# Patient Record
Sex: Female | Born: 1945 | Race: Black or African American | Hispanic: No | Smoking: Never smoker
Health system: Southern US, Community
[De-identification: ages and names within clinical notes are randomized; demographics above are authoritative.]

## PROBLEM LIST (undated history)

## (undated) DIAGNOSIS — E049 Nontoxic goiter, unspecified: Secondary | ICD-10-CM

## (undated) DIAGNOSIS — IMO0001 Reserved for inherently not codable concepts without codable children: Secondary | ICD-10-CM

## (undated) DIAGNOSIS — N189 Chronic kidney disease, unspecified: Secondary | ICD-10-CM

## (undated) DIAGNOSIS — K219 Gastro-esophageal reflux disease without esophagitis: Secondary | ICD-10-CM

## (undated) DIAGNOSIS — M4800 Spinal stenosis, site unspecified: Secondary | ICD-10-CM

## (undated) DIAGNOSIS — H269 Unspecified cataract: Secondary | ICD-10-CM

## (undated) DIAGNOSIS — T7840XA Allergy, unspecified, initial encounter: Secondary | ICD-10-CM

## (undated) DIAGNOSIS — D649 Anemia, unspecified: Secondary | ICD-10-CM

## (undated) DIAGNOSIS — Z780 Asymptomatic menopausal state: Secondary | ICD-10-CM

## (undated) DIAGNOSIS — E785 Hyperlipidemia, unspecified: Secondary | ICD-10-CM

## (undated) DIAGNOSIS — M1990 Unspecified osteoarthritis, unspecified site: Secondary | ICD-10-CM

## (undated) DIAGNOSIS — I10 Essential (primary) hypertension: Secondary | ICD-10-CM

## (undated) HISTORY — DX: Nontoxic goiter, unspecified: E04.9

## (undated) HISTORY — DX: Essential (primary) hypertension: I10

## (undated) HISTORY — DX: Anemia, unspecified: D64.9

## (undated) HISTORY — DX: Unspecified osteoarthritis, unspecified site: M19.90

## (undated) HISTORY — DX: Unspecified cataract: H26.9

## (undated) HISTORY — DX: Chronic kidney disease, unspecified: N18.9

## (undated) HISTORY — DX: Gastro-esophageal reflux disease without esophagitis: K21.9

## (undated) HISTORY — DX: Hyperlipidemia, unspecified: E78.5

## (undated) HISTORY — DX: Reserved for inherently not codable concepts without codable children: IMO0001

## (undated) HISTORY — DX: Asymptomatic menopausal state: Z78.0

## (undated) HISTORY — DX: Spinal stenosis, site unspecified: M48.00

## (undated) HISTORY — DX: Allergy, unspecified, initial encounter: T78.40XA

---

## 1998-01-03 ENCOUNTER — Encounter: Payer: Self-pay | Admitting: Internal Medicine

## 1998-01-03 ENCOUNTER — Ambulatory Visit (HOSPITAL_COMMUNITY): Admission: RE | Admit: 1998-01-03 | Discharge: 1998-01-03 | Payer: Self-pay | Admitting: Internal Medicine

## 1998-06-22 ENCOUNTER — Other Ambulatory Visit: Admission: RE | Admit: 1998-06-22 | Discharge: 1998-06-22 | Payer: Self-pay | Admitting: *Deleted

## 1998-08-24 ENCOUNTER — Encounter (INDEPENDENT_AMBULATORY_CARE_PROVIDER_SITE_OTHER): Payer: Self-pay | Admitting: Specialist

## 1998-08-24 ENCOUNTER — Other Ambulatory Visit: Admission: RE | Admit: 1998-08-24 | Discharge: 1998-08-24 | Payer: Self-pay | Admitting: *Deleted

## 1999-07-05 ENCOUNTER — Emergency Department (HOSPITAL_COMMUNITY): Admission: EM | Admit: 1999-07-05 | Discharge: 1999-07-05 | Payer: Self-pay | Admitting: *Deleted

## 1999-07-10 ENCOUNTER — Other Ambulatory Visit: Admission: RE | Admit: 1999-07-10 | Discharge: 1999-07-10 | Payer: Self-pay | Admitting: *Deleted

## 1999-07-23 ENCOUNTER — Encounter: Payer: Self-pay | Admitting: Occupational Medicine

## 1999-07-23 ENCOUNTER — Ambulatory Visit (HOSPITAL_COMMUNITY): Admission: RE | Admit: 1999-07-23 | Discharge: 1999-07-23 | Payer: Self-pay | Admitting: Occupational Medicine

## 1999-07-26 ENCOUNTER — Encounter (HOSPITAL_COMMUNITY): Admission: RE | Admit: 1999-07-26 | Discharge: 1999-10-24 | Payer: Self-pay | Admitting: Occupational Medicine

## 1999-08-09 ENCOUNTER — Encounter: Admission: RE | Admit: 1999-08-09 | Discharge: 1999-09-04 | Payer: Self-pay | Admitting: Occupational Medicine

## 1999-08-22 ENCOUNTER — Encounter: Payer: Self-pay | Admitting: Neurosurgery

## 1999-08-22 ENCOUNTER — Ambulatory Visit (HOSPITAL_COMMUNITY): Admission: RE | Admit: 1999-08-22 | Discharge: 1999-08-22 | Payer: Self-pay | Admitting: Neurosurgery

## 2000-01-15 LAB — HM COLONOSCOPY

## 2001-02-25 ENCOUNTER — Other Ambulatory Visit: Admission: RE | Admit: 2001-02-25 | Discharge: 2001-02-25 | Payer: Self-pay | Admitting: *Deleted

## 2001-09-23 ENCOUNTER — Encounter: Admission: RE | Admit: 2001-09-23 | Discharge: 2001-09-23 | Payer: Self-pay | Admitting: Nephrology

## 2001-09-23 ENCOUNTER — Encounter: Payer: Self-pay | Admitting: Nephrology

## 2001-09-25 ENCOUNTER — Encounter: Payer: Self-pay | Admitting: Nephrology

## 2001-09-25 ENCOUNTER — Encounter: Admission: RE | Admit: 2001-09-25 | Discharge: 2001-09-25 | Payer: Self-pay | Admitting: Nephrology

## 2003-03-22 ENCOUNTER — Other Ambulatory Visit: Admission: RE | Admit: 2003-03-22 | Discharge: 2003-03-22 | Payer: Self-pay | Admitting: Internal Medicine

## 2003-05-13 ENCOUNTER — Ambulatory Visit (HOSPITAL_COMMUNITY): Admission: RE | Admit: 2003-05-13 | Discharge: 2003-05-13 | Payer: Self-pay | Admitting: Gastroenterology

## 2003-08-30 ENCOUNTER — Encounter: Admission: RE | Admit: 2003-08-30 | Discharge: 2003-08-30 | Payer: Self-pay | Admitting: Internal Medicine

## 2005-01-14 LAB — HM MAMMOGRAPHY

## 2005-01-14 LAB — HM PAP SMEAR

## 2005-01-14 LAB — HM COLONOSCOPY: HM Colonoscopy: NORMAL

## 2009-01-14 LAB — HM PAP SMEAR

## 2009-03-20 ENCOUNTER — Encounter: Admission: RE | Admit: 2009-03-20 | Discharge: 2009-03-20 | Payer: Self-pay | Admitting: Internal Medicine

## 2009-05-16 ENCOUNTER — Encounter: Admission: RE | Admit: 2009-05-16 | Discharge: 2009-05-16 | Payer: Self-pay | Admitting: Internal Medicine

## 2010-01-14 LAB — HM MAMMOGRAPHY: HM Mammogram: NORMAL

## 2010-03-05 ENCOUNTER — Ambulatory Visit
Admission: RE | Admit: 2010-03-05 | Discharge: 2010-03-05 | Disposition: A | Discharge status: Home / Self Care | Payer: 59 | Source: Ambulatory Visit | Attending: Family Medicine | Admitting: Family Medicine

## 2010-03-05 ENCOUNTER — Other Ambulatory Visit: Payer: Self-pay | Admitting: Family Medicine

## 2010-03-05 DIAGNOSIS — R29898 Other symptoms and signs involving the musculoskeletal system: Secondary | ICD-10-CM

## 2010-03-05 DIAGNOSIS — R200 Anesthesia of skin: Secondary | ICD-10-CM

## 2010-03-14 ENCOUNTER — Ambulatory Visit (HOSPITAL_COMMUNITY)
Admission: RE | Admit: 2010-03-14 | Discharge: 2010-03-14 | Disposition: A | Discharge status: Home / Self Care | Payer: 59 | Source: Ambulatory Visit | Attending: Neurosurgery | Admitting: Neurosurgery

## 2010-03-14 ENCOUNTER — Other Ambulatory Visit (HOSPITAL_COMMUNITY): Payer: Self-pay | Admitting: Neurosurgery

## 2010-03-14 ENCOUNTER — Encounter (HOSPITAL_COMMUNITY)
Admission: RE | Admit: 2010-03-14 | Discharge: 2010-03-14 | Disposition: A | Discharge status: Home / Self Care | Payer: 59 | Source: Ambulatory Visit | Attending: Neurosurgery | Admitting: Neurosurgery

## 2010-03-14 DIAGNOSIS — Z01818 Encounter for other preprocedural examination: Secondary | ICD-10-CM | POA: Insufficient documentation

## 2010-03-14 DIAGNOSIS — Z01810 Encounter for preprocedural cardiovascular examination: Secondary | ICD-10-CM | POA: Insufficient documentation

## 2010-03-14 DIAGNOSIS — M5090 Cervical disc disorder, unspecified, unspecified cervical region: Secondary | ICD-10-CM | POA: Insufficient documentation

## 2010-03-14 DIAGNOSIS — Z01812 Encounter for preprocedural laboratory examination: Secondary | ICD-10-CM | POA: Insufficient documentation

## 2010-03-14 DIAGNOSIS — Z01811 Encounter for preprocedural respiratory examination: Secondary | ICD-10-CM

## 2010-03-14 LAB — BASIC METABOLIC PANEL
BUN: 17 mg/dL (ref 6–23)
CO2: 31 mEq/L (ref 19–32)
Calcium: 10.2 mg/dL (ref 8.4–10.5)
Chloride: 101 mEq/L (ref 96–112)
Creatinine, Ser: 0.95 mg/dL (ref 0.4–1.2)
GFR calc Af Amer: >60 mL/min (ref >60)
GFR calc non Af Amer: 59 mL/min — ABNORMAL LOW (ref >60)
Glucose, Bld: 98 mg/dL (ref 70–99)
Potassium: 4.2 mEq/L (ref 3.5–5.1)
Sodium: 138 mEq/L (ref 135–145)

## 2010-03-14 LAB — URINALYSIS, ROUTINE W REFLEX MICROSCOPIC
Bilirubin Urine: NEGATIVE
Hgb urine dipstick: NEGATIVE
Ketones, ur: NEGATIVE mg/dL
Nitrite: NEGATIVE
Protein, ur: NEGATIVE mg/dL
Specific Gravity, Urine: 1.009 (ref 1.005–1.030)
Urine Glucose, Fasting: NEGATIVE mg/dL
Urobilinogen, UA: 0.2 mg/dL (ref 0.0–1.0)
pH: 7 (ref 5.0–8.0)

## 2010-03-14 LAB — SURGICAL PCR SCREEN
MRSA, PCR: NEGATIVE
Staphylococcus aureus: NEGATIVE

## 2010-03-14 LAB — CBC
HCT: 39.1 % (ref 36.0–46.0)
Hemoglobin: 12.8 g/dL (ref 12.0–15.0)
MCH: 29.2 pg (ref 26.0–34.0)
MCHC: 32.7 g/dL (ref 30.0–36.0)
MCV: 89.1 fL (ref 78.0–100.0)
Platelets: 283 10*3/uL (ref 150–400)
RBC: 4.39 MIL/uL (ref 3.87–5.11)
RDW: 13.5 % (ref 11.5–15.5)
WBC: 16.5 10*3/uL — ABNORMAL HIGH (ref 4.0–10.5)

## 2010-03-14 LAB — URINE MICROSCOPIC-ADD ON

## 2010-03-14 LAB — APTT: aPTT: 27 seconds (ref 24–37)

## 2010-03-14 LAB — PROTIME-INR
INR: 0.93 (ref 0.00–1.49)
Prothrombin Time: 12.7 seconds (ref 11.6–15.2)

## 2010-03-15 ENCOUNTER — Observation Stay (HOSPITAL_COMMUNITY)
Admission: RE | Admit: 2010-03-15 | Discharge: 2010-03-16 | Disposition: A | Discharge status: Home / Self Care | Payer: 59 | Source: Ambulatory Visit | Attending: Neurosurgery | Admitting: Neurosurgery

## 2010-03-15 ENCOUNTER — Observation Stay (HOSPITAL_COMMUNITY): Payer: 59

## 2010-03-15 DIAGNOSIS — K219 Gastro-esophageal reflux disease without esophagitis: Secondary | ICD-10-CM | POA: Insufficient documentation

## 2010-03-15 DIAGNOSIS — M47812 Spondylosis without myelopathy or radiculopathy, cervical region: Secondary | ICD-10-CM | POA: Insufficient documentation

## 2010-03-15 DIAGNOSIS — I10 Essential (primary) hypertension: Secondary | ICD-10-CM | POA: Insufficient documentation

## 2010-03-15 DIAGNOSIS — Z7982 Long term (current) use of aspirin: Secondary | ICD-10-CM | POA: Insufficient documentation

## 2010-03-15 DIAGNOSIS — M5000 Cervical disc disorder with myelopathy, unspecified cervical region: Principal | ICD-10-CM | POA: Insufficient documentation

## 2010-03-23 NOTE — Op Note (Signed)
NAMEVERNISHA, [name] NO.:  [id number]  MEDICAL RECORD NO.:  [id number]           PATIENT TYPE:  I  LOCATION:  [id number]                         FACILITY:  [facility]  PHYSICIAN:  Otilio Connors, M.D.  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  PROCEDURE:  Anterior cervical decompression, diskectomy, fusion at C6-7 with allograft bone and Trestle anterior cervical plate.  SURGEON:  Otilio Connors, MD  ASSISTANT:  Hosie Spangle, MD  General endotracheal tube anesthesia.  ESTIMATED BLOOD LOSS:  Minimal.  BLOOD GIVEN:  None.  DRAINS:  None.  COMPLICATIONS:  None.  REASON FOR PROCEDURE:  The patient is a 65 year old woman who has had neck and left arm pain, numbness, and weakness, found to have decreased left triceps finger extension and even some decreased sensation in entire left hand worse in the 7-8 distribution.  An MRI was done showing multilevel spondylotic changes in the cervical spine but has worse spondylotic change and disk herniation central to the left at the 6-7 level causing some left-sided canal stenosis and foraminal stenosis. The patient was brought in for decompression and fusion.  PROCEDURE IN DETAIL:  The patient was brought to the operating room. General anesthesia was induced.  The patient was placed in 10-pound halter traction, prepared and draped in sterile fashion.  Site of incision injected with 10 mL of 1% lidocaine with epinephrine.  Incision was then made from the midline to the anterior border of the sternocleidomastoid muscle on the left side, neck incision taken down to the platysma and hemostasis obtained with Bovie cauterization.   The platysma was incised with the Bovie and blunt dissection taken through the anterior cervical fascia of the anterior cervical spine.  Needles were placed at 2 interspaces and x-rays obtained showing the top needle was at the 6-7 space.  The 6-7 disk space was incised with a 15-blade and partial diskectomy done with pituitary rongeurs as the needles were removed.  Longus coli muscles were reflected laterally on each side using the Bovie, and self-retaining retractors were placed after removing the extensive osteophytes with osteophyte tool.  Diskectomy was continued with the curettes and pituitary rongeurs, and then distraction pins were placed in C6-C7 interspace distracted, and self-retaining retractors were also placed.  Microscope was brought in for microdissection and high-speed drill was used to drill through the spondylotic disk space removing cartilaginous endplate.  We then used 1 and 2-mm Kerrison punches to remove posterior disk osteophyte and ligament, decompressing the central canal and performing bilateral foraminotomies.  There was significant calcified disk herniation and some soft disk herniation to the left side.  This was removed and decompressing the central canal and the bilateral nerve roots.  When we were finished, we had good decompression of the roots exiting on each side.  Measured height of disk space to be 5 mm, and 5-mm allograft bone was tapped  into place.  Distraction pins were removed.  Weight was removed from the traction.  Got hemostasis with Gelfoam and thrombin. We irrigated with antibiotic solution.  Bone plug was in good firm position and Trestle anterior cervical plate was placed over the anterior cervical spine, and 2 screws placed in C6, 2 into C7.  These were final tightened.  The lateral x-rays were obtained showing good position of plate, screws, bone plug at the 6-7 level.  Retractors were removed.  Hemostasis obtained with bipolar  cauterization.  We irrigated with antibiotic solution, and we had very good hemostasis.  The platysma was closed with 3-0 Vicryl interrupted sutures.  Subcutaneous tissue closed with the same.  Skin closed with benzoin and Steri-Strips. Dressing was placed.  The patient was placed into a soft cervical collar, awakened from anesthesia, and transferred to recovery room in stable condition.          ______________________________ Otilio Connors, M.D.     JRH/MEDQ  D:  03/15/2010  T:  03/16/2010  Job:  [id number]  Electronically Signed by Hazle Coca M.D. on 03/23/2010 05:06:44 PM

## 2010-04-23 ENCOUNTER — Other Ambulatory Visit: Payer: Self-pay | Admitting: Internal Medicine

## 2010-04-23 DIAGNOSIS — Z1231 Encounter for screening mammogram for malignant neoplasm of breast: Secondary | ICD-10-CM

## 2010-04-26 ENCOUNTER — Ambulatory Visit
Admission: RE | Admit: 2010-04-26 | Discharge: 2010-04-26 | Disposition: A | Discharge status: Home / Self Care | Payer: 59 | Source: Ambulatory Visit | Attending: Internal Medicine | Admitting: Internal Medicine

## 2010-04-26 DIAGNOSIS — Z1231 Encounter for screening mammogram for malignant neoplasm of breast: Secondary | ICD-10-CM

## 2010-06-01 NOTE — Op Note (Signed)
NAME:  Jo [name], Jo [name]                          ACCOUNT NO.:  [id number]   MEDICAL RECORD NO.:  [id number]                   PATIENT TYPE:  AMB   LOCATION:  ENDO                                 [facility]ACILITY[facility]  [facility]   PHYSICIAN:  Earle Gell, M.D.                DATE OF BIRTH:  1945/03/23   DATE OF PROCEDURE:  05/13/2003  DATE OF DISCHARGE:                                 OPERATIVE REPORT   PROCEDURE:  Screening colonoscopy.   INDICATIONS:  Jo [name] is a 65 year old female, born Dec 06, 1945.  Jo [name] is scheduled to undergo her first screening colonoscopy with  polypectomy to prevent colon cancer.   ENDOSCOPIST:  Earle Gell, M.D.   PREMEDICATION:  Versed 5 mg, Demerol 50 mg.   DESCRIPTION OF PROCEDURE:  After obtaining informed consent, Jo [name] was  placed in the left lateral decubitus position.  I administered intravenous  Demerol and intravenous Versed to achieve conscious sedation for the  procedure.  The patient's blood pressure, oxygen saturation and cardiac  rhythm were monitored throughout the procedure and documented in the medical  record.   Anal inspection and digital rectal exam were normal.  The Olympus adjustable  pediatric colonoscope was introduced into the rectum and advanced to the  cecum.  Colonic preparation for the exam today was excellent.   Rectum:  Normal.  Sigmoid colon and descending colon:  Normal.  Splenic flexure:  Normal.  Transverse colon:  Normal.  Hepatic flexure:  Normal.  Ascending colon:  Normal.  Cecum and ileocecal valve:  Normal.   ASSESSMENT:  Normal screening proctocolonoscopy to the cecum.   RECOMMENDATIONS:  Repeat colonoscopy in 10 years.                                               Earle Gell, M.D.   MJ/MEDQ  D:  05/13/2003  T:  05/13/2003  Job:  [id number]   cc:   Leilani Merl, M.D.  [address]  [place] [address]  Fax: [phone]

## 2010-06-15 HISTORY — PX: CERVICAL SPINE SURGERY: SHX589

## 2010-07-25 LAB — LIPID PANEL
Cholesterol: 198 mg/dL (ref 0–200)
HDL: 66 mg/dL (ref 35–70)
LDL Cholesterol: 122 mg/dL
LDl/HDL Ratio: 3
Triglycerides: 52 mg/dL (ref 40–160)

## 2010-07-25 LAB — BASIC METABOLIC PANEL
Glucose: 88 mg/dL
Potassium: 4.3 mmol/L (ref 3.4–5.3)

## 2011-01-21 ENCOUNTER — Ambulatory Visit: Payer: Self-pay | Admitting: Internal Medicine

## 2011-02-23 ENCOUNTER — Encounter: Payer: Self-pay | Admitting: *Deleted

## 2011-02-23 DIAGNOSIS — E785 Hyperlipidemia, unspecified: Secondary | ICD-10-CM

## 2011-02-23 DIAGNOSIS — IMO0001 Reserved for inherently not codable concepts without codable children: Secondary | ICD-10-CM | POA: Insufficient documentation

## 2011-02-23 DIAGNOSIS — D649 Anemia, unspecified: Secondary | ICD-10-CM | POA: Insufficient documentation

## 2011-02-23 DIAGNOSIS — I10 Essential (primary) hypertension: Secondary | ICD-10-CM | POA: Insufficient documentation

## 2011-02-25 ENCOUNTER — Ambulatory Visit (INDEPENDENT_AMBULATORY_CARE_PROVIDER_SITE_OTHER): Payer: 59 | Admitting: Internal Medicine

## 2011-02-25 ENCOUNTER — Encounter: Payer: Self-pay | Admitting: Internal Medicine

## 2011-02-25 DIAGNOSIS — Z79899 Other long term (current) drug therapy: Secondary | ICD-10-CM

## 2011-02-25 DIAGNOSIS — E785 Hyperlipidemia, unspecified: Secondary | ICD-10-CM

## 2011-02-25 DIAGNOSIS — I10 Essential (primary) hypertension: Secondary | ICD-10-CM

## 2011-02-25 MED ORDER — ATORVASTATIN CALCIUM 20 MG PO TABS: 20.0000 mg | ORAL_TABLET | Freq: Every day | ORAL | Status: DC

## 2011-02-25 MED ORDER — LISINOPRIL 10 MG PO TABS: 10.0000 mg | ORAL_TABLET | Freq: Every day | ORAL | Status: DC

## 2011-02-25 NOTE — Progress Notes (Signed)
  Subjective:    [name], female    DOB: 12-04-1945, 66 y.o.   MRN: [id number]  HPI HTN treated and controlled. Hyperlipidemia treated and controlled. No side affects from meds. No other complaints.   Review of Systems UNchanged.    Objective:   Physical Exam  Constitutional: She is oriented to person, place, and time. She appears well-developed and well-nourished.  Eyes: EOM are normal.  Neck: Neck supple.  Cardiovascular: Normal rate, regular rhythm and normal heart sounds.   Pulmonary/Chest: Effort normal and breath sounds normal.  Musculoskeletal: Normal range of motion.  Neurological: She is alert and oriented to person, place, and time.          Assessment & Plan:  Refill meds one year. CPE 6-7 months.

## 2011-02-26 LAB — COMPREHENSIVE METABOLIC PANEL
ALT: 10 U/L (ref 0–35)
AST: 20 U/L (ref 0–37)
Albumin: 4.1 g/dL (ref 3.5–5.2)
Alkaline Phosphatase: 71 U/L (ref 39–117)
BUN: 15 mg/dL (ref 6–23)
CO2: 26 mEq/L (ref 19–32)
Calcium: 9.5 mg/dL (ref 8.4–10.5)
Chloride: 102 mEq/L (ref 96–112)
Creat: 0.92 mg/dL (ref 0.50–1.10)
Glucose, Bld: 86 mg/dL (ref 70–99)
Potassium: 3.9 mEq/L (ref 3.5–5.3)
Sodium: 139 mEq/L (ref 135–145)
Total Bilirubin: 0.4 mg/dL (ref 0.3–1.2)
Total Protein: 7.2 g/dL (ref 6.0–8.3)

## 2011-05-06 ENCOUNTER — Other Ambulatory Visit: Payer: Self-pay | Admitting: Internal Medicine

## 2011-05-06 DIAGNOSIS — Z1231 Encounter for screening mammogram for malignant neoplasm of breast: Secondary | ICD-10-CM

## 2011-05-07 ENCOUNTER — Ambulatory Visit
Admission: RE | Admit: 2011-05-07 | Discharge: 2011-05-07 | Disposition: A | Discharge status: Home / Self Care | Payer: 59 | Source: Ambulatory Visit | Attending: Internal Medicine | Admitting: Internal Medicine

## 2011-05-07 DIAGNOSIS — Z1231 Encounter for screening mammogram for malignant neoplasm of breast: Secondary | ICD-10-CM

## 2011-05-08 NOTE — Progress Notes (Signed)
Spoke with patient and let her know test was normal.

## 2011-05-08 NOTE — Progress Notes (Signed)
LMOM to call back

## 2011-09-02 ENCOUNTER — Encounter: Payer: Self-pay | Admitting: Internal Medicine

## 2011-09-02 ENCOUNTER — Ambulatory Visit (INDEPENDENT_AMBULATORY_CARE_PROVIDER_SITE_OTHER): Payer: 59 | Admitting: Internal Medicine

## 2011-09-02 VITALS — BP 152/88 | HR 63 | Temp 98.1°F | Resp 16 | Ht 65.0 in | Wt 179.0 lb

## 2011-09-02 DIAGNOSIS — Z79899 Other long term (current) drug therapy: Secondary | ICD-10-CM

## 2011-09-02 DIAGNOSIS — I10 Essential (primary) hypertension: Secondary | ICD-10-CM

## 2011-09-02 DIAGNOSIS — E785 Hyperlipidemia, unspecified: Secondary | ICD-10-CM

## 2011-09-02 DIAGNOSIS — D509 Iron deficiency anemia, unspecified: Secondary | ICD-10-CM

## 2011-09-02 DIAGNOSIS — Z0000 Encounter for general adult medical examination without abnormal findings: Secondary | ICD-10-CM

## 2011-09-02 LAB — POCT URINALYSIS DIPSTICK
Bilirubin, UA: NEGATIVE
Glucose, UA: NEGATIVE
Ketones, UA: NEGATIVE
Leukocytes, UA: NEGATIVE
Nitrite, UA: NEGATIVE
Protein, UA: 100
Spec Grav, UA: 1.02
Urobilinogen, UA: 0.2
pH, UA: 7

## 2011-09-02 LAB — CBC
HCT: 35.3 % — ABNORMAL LOW (ref 36.0–46.0)
Hemoglobin: 12 g/dL (ref 12.0–15.0)
MCH: 28.7 pg (ref 26.0–34.0)
MCHC: 34 g/dL (ref 30.0–36.0)
MCV: 84.4 fL (ref 78.0–100.0)
Platelets: 262 10*3/uL (ref 150–400)
RBC: 4.18 MIL/uL (ref 3.87–5.11)
RDW: 13.8 % (ref 11.5–15.5)
WBC: 7.9 10*3/uL (ref 4.0–10.5)

## 2011-09-02 LAB — COMPREHENSIVE METABOLIC PANEL
ALT: 14 U/L (ref 0–35)
AST: 20 U/L (ref 0–37)
Albumin: 4 g/dL (ref 3.5–5.2)
Alkaline Phosphatase: 69 U/L (ref 39–117)
BUN: 15 mg/dL (ref 6–23)
CO2: 29 mEq/L (ref 19–32)
Calcium: 9.7 mg/dL (ref 8.4–10.5)
Chloride: 103 mEq/L (ref 96–112)
Creat: 0.86 mg/dL (ref 0.50–1.10)
Glucose, Bld: 85 mg/dL (ref 70–99)
Potassium: 4.5 mEq/L (ref 3.5–5.3)
Sodium: 138 mEq/L (ref 135–145)
Total Bilirubin: 0.5 mg/dL (ref 0.3–1.2)
Total Protein: 7.3 g/dL (ref 6.0–8.3)

## 2011-09-02 LAB — POCT UA - MICROSCOPIC ONLY
Casts, Ur, LPF, POC: NEGATIVE
Crystals, Ur, HPF, POC: NEGATIVE
Mucus, UA: NEGATIVE
Yeast, UA: NEGATIVE

## 2011-09-02 LAB — LIPID PANEL
Cholesterol: 197 mg/dL (ref 0–200)
HDL: 66 mg/dL (ref >39)
LDL Cholesterol: 121 mg/dL — ABNORMAL HIGH (ref 0–99)
Total CHOL/HDL Ratio: 3 Ratio
Triglycerides: 51 mg/dL (ref <150)
VLDL: 10 mg/dL (ref 0–40)

## 2011-09-02 LAB — TSH: TSH: 1.12 u[IU]/mL (ref 0.350–4.500)

## 2011-09-02 LAB — IFOBT (OCCULT BLOOD): IFOBT: NEGATIVE

## 2011-09-02 NOTE — Progress Notes (Signed)
  Subjective:    [name], female    DOB: 17-Jan-1945, 66 y.o.   MRN: [id number]  HPI Doing well BP and lipids are stable. Tolerates meds See scanned hx  Review of Systems see scanned ros   Objective:   Physical Exam  Constitutional: She appears well-developed and well-nourished.  HENT:  Right Ear: External ear normal.  Left Ear: External ear normal.  Nose: Nose normal.  Mouth/Throat: Oropharynx is clear and moist.  Eyes: EOM are normal. Pupils are equal, round, and reactive to light.  Neck: Normal range of motion. Neck supple. No thyromegaly present.  Cardiovascular: Normal rate, regular rhythm and normal heart sounds.   Pulmonary/Chest: Effort normal and breath sounds normal.  Abdominal: Soft. Bowel sounds are normal.  Genitourinary: No breast swelling, tenderness or discharge. Pelvic exam was performed with patient supine.       Normal rectal exam hemosure done  Lymphadenopathy:    She has no cervical adenopathy.   ekg labs       Assessment & Plan:  RF meds 66yr

## 2012-03-09 ENCOUNTER — Ambulatory Visit: Payer: 59 | Admitting: Internal Medicine

## 2012-03-13 ENCOUNTER — Other Ambulatory Visit: Payer: Self-pay | Admitting: Internal Medicine

## 2012-03-14 ENCOUNTER — Other Ambulatory Visit: Payer: Self-pay | Admitting: Internal Medicine

## 2012-03-14 NOTE — Telephone Encounter (Signed)
PT SAYS PHARMACY HAS SENT REQUESTS TO [place] FOR A REFILL ON LISINOPRIL AND THEY HAVE NOT HEARD FROM [place].  SHE IS NOW COMPLETELY OUT.  SHE MISSED HER APPOINTMENT AND HASN'T RESCHEDULED YET THAT SHE HAD WITH DR. Elder Cyphers.  PLEASE CALL [phone]

## 2012-03-14 NOTE — Telephone Encounter (Signed)
Needs office visit.

## 2012-03-16 ENCOUNTER — Telehealth: Payer: Self-pay | Admitting: *Deleted

## 2012-03-16 NOTE — Telephone Encounter (Signed)
Rise Mu, PA-C at 03/14/2012 5:03 PM   Status: Signed            Needs office visit        Tye Savoy at 03/14/2012 11:41 AM    Status: Signed             PT SAYS PHARMACY HAS SENT REQUESTS TO [place] FOR A REFILL ON LISINOPRIL AND THEY HAVE NOT HEARD FROM [place]. SHE IS NOW COMPLETELY OUT. SHE MISSED HER APPOINTMENT AND HASN'T RESCHEDULED YET THAT SHE HAD WITH DR. Elder Cyphers. PLEASE CALL [phone]

## 2012-03-17 NOTE — Telephone Encounter (Signed)
Called patient to advise she needs an office visit. mailbox not set up yet.

## 2012-04-16 ENCOUNTER — Other Ambulatory Visit: Payer: Self-pay | Admitting: Physician Assistant

## 2012-04-20 ENCOUNTER — Encounter: Payer: Self-pay | Admitting: Internal Medicine

## 2012-04-20 ENCOUNTER — Ambulatory Visit (INDEPENDENT_AMBULATORY_CARE_PROVIDER_SITE_OTHER): Payer: 59 | Admitting: Internal Medicine

## 2012-04-20 VITALS — BP 138/80 | HR 68 | Temp 98.2°F | Resp 16 | Ht 65.0 in | Wt 180.0 lb

## 2012-04-20 DIAGNOSIS — Z79899 Other long term (current) drug therapy: Secondary | ICD-10-CM

## 2012-04-20 DIAGNOSIS — E782 Mixed hyperlipidemia: Secondary | ICD-10-CM

## 2012-04-20 DIAGNOSIS — I10 Essential (primary) hypertension: Secondary | ICD-10-CM

## 2012-04-20 LAB — COMPREHENSIVE METABOLIC PANEL
ALT: 13 U/L (ref 0–35)
AST: 20 U/L (ref 0–37)
Albumin: 4 g/dL (ref 3.5–5.2)
Alkaline Phosphatase: 68 U/L (ref 39–117)
BUN: 14 mg/dL (ref 6–23)
CO2: 29 mEq/L (ref 19–32)
Calcium: 9.5 mg/dL (ref 8.4–10.5)
Chloride: 105 mEq/L (ref 96–112)
Creat: 0.89 mg/dL (ref 0.50–1.10)
Glucose, Bld: 88 mg/dL (ref 70–99)
Potassium: 4.7 mEq/L (ref 3.5–5.3)
Sodium: 140 mEq/L (ref 135–145)
Total Bilirubin: 0.4 mg/dL (ref 0.3–1.2)
Total Protein: 6.7 g/dL (ref 6.0–8.3)

## 2012-04-20 MED ORDER — LISINOPRIL 10 MG PO TABS: 10.0000 mg | ORAL_TABLET | Freq: Every day | ORAL | Status: DC

## 2012-04-20 MED ORDER — ATORVASTATIN CALCIUM 20 MG PO TABS: 20.0000 mg | ORAL_TABLET | Freq: Every day | ORAL | Status: DC

## 2012-04-20 NOTE — Progress Notes (Signed)
  Subjective:    [name], female    DOB: [birth year], 67 y.o.   MRN: [id number]  HPI HTN, lipid disorder, med review all is stable and well. Exercising, eating healthy and taking meds.   Review of Systems same    Objective:   Physical Exam  Constitutional: She is oriented to person, place, and time. She appears well-developed and well-nourished.  Eyes: EOM are normal.  Cardiovascular: Normal rate, regular rhythm and normal heart sounds.   Pulmonary/Chest: Effort normal and breath sounds normal.  Neurological: She is alert and oriented to person, place, and time. She exhibits normal muscle tone. Coordination normal.  Skin: No rash noted.  Psychiatric: She has a normal mood and affect. Her behavior is normal. Judgment and thought content normal.    bmet      Assessment & Plan:  RF meds 1 yr

## 2012-04-20 NOTE — Patient Instructions (Signed)
Hypertension As your heart beats, it forces blood through your arteries. This force is your blood pressure. If the pressure is too high, it is called hypertension (HTN) or high blood pressure. HTN is dangerous because you may have it and not know it. High blood pressure may mean that your heart has to work harder to pump blood. Your arteries may be narrow or stiff. The extra work puts you at risk for heart disease, stroke, and other problems.  Blood pressure consists of two numbers, a higher number over a lower, 110/72, for example. It is stated as "110 over 72." The ideal is below 120 for the top number (systolic) and under 80 for the bottom (diastolic). Write down your blood pressure today. You should pay close attention to your blood pressure if you have certain conditions such as:  Heart failure.  Prior heart attack.  Diabetes  Chronic kidney disease.  Prior stroke.  Multiple risk factors for heart disease. To see if you have HTN, your blood pressure should be measured while you are seated with your arm held at the level of the heart. It should be measured at least twice. A one-time elevated blood pressure reading (especially in the Emergency Department) does not mean that you need treatment. There may be conditions in which the blood pressure is different between your right and left arms. It is important to see your caregiver soon for a recheck. Most people have essential hypertension which means that there is not a specific cause. This type of high blood pressure may be lowered by changing lifestyle factors such as:  Stress.  Smoking.  Lack of exercise.  Excessive weight.  Drug/tobacco/alcohol use.  Eating less salt. Most people do not have symptoms from high blood pressure until it has caused damage to the body. Effective treatment can often prevent, delay or reduce that damage. TREATMENT  When a cause has been identified, treatment for high blood pressure is directed at the  cause. There are a large number of medications to treat HTN. These fall into several categories, and your caregiver will help you select the medicines that are best for you. Medications may have side effects. You should review side effects with your caregiver. If your blood pressure stays high after you have made lifestyle changes or started on medicines,   Your medication(s) may need to be changed.  Other problems may need to be addressed.  Be certain you understand your prescriptions, and know how and when to take your medicine.  Be sure to follow up with your caregiver within the time frame advised (usually within two weeks) to have your blood pressure rechecked and to review your medications.  If you are taking more than one medicine to lower your blood pressure, make sure you know how and at what times they should be taken. Taking two medicines at the same time can result in blood pressure that is too low. SEEK IMMEDIATE MEDICAL CARE IF:  You develop a severe headache, blurred or changing vision, or confusion.  You have unusual weakness or numbness, or a faint feeling.  You have severe chest or abdominal pain, vomiting, or breathing problems. MAKE SURE YOU:   Understand these instructions.  Will watch your condition.  Will get help right away if you are not doing well or get worse. Document Released: 12/31/2004 Document Revised: 03/25/2011 Document Reviewed: 08/21/2007 [facility] Patient Information 2013 [place]. DASH Diet The DASH diet stands for "Dietary Approaches to Stop Hypertension." It is a healthy  eating plan that has been shown to reduce high blood pressure (hypertension) in as little as 14 days, while also possibly providing other significant health benefits. These other health benefits include reducing the risk of breast cancer after menopause and reducing the risk of type 2 diabetes, heart disease, colon cancer, and stroke. Health benefits also include weight loss  and slowing kidney failure in patients with chronic kidney disease.  DIET GUIDELINES  Limit salt (sodium). Your diet should contain less than 1500 mg of sodium daily.  Limit refined or processed carbohydrates. Your diet should include mostly whole grains. Desserts and added sugars should be used sparingly.  Include small amounts of heart-healthy fats. These types of fats include nuts, oils, and tub margarine. Limit saturated and trans fats. These fats have been shown to be harmful in the body. CHOOSING FOODS  The following food groups are based on a 2000 calorie diet. See your Registered Dietitian for individual calorie needs. Grains and Grain Products (6 to 8 servings daily)  Eat More Often: Whole-wheat bread, brown rice, whole-grain or wheat pasta, quinoa, popcorn without added fat or salt (air popped).  Eat Less Often: White bread, white pasta, white rice, cornbread. Vegetables (4 to 5 servings daily)  Eat More Often: Fresh, frozen, and canned vegetables. Vegetables may be raw, steamed, roasted, or grilled with a minimal amount of fat.  Eat Less Often/Avoid: Creamed or fried vegetables. Vegetables in a cheese sauce. Fruit (4 to 5 servings daily)  Eat More Often: All fresh, canned (in natural juice), or frozen fruits. Dried fruits without added sugar. One hundred percent fruit juice ( cup [237 mL] daily).  Eat Less Often: Dried fruits with added sugar. Canned fruit in light or heavy syrup. [employer], Fish, and Poultry (2 servings or less daily. One serving is 3 to 4 oz [85-114 g]).  Eat More Often: Ninety percent or leaner ground beef, tenderloin, sirloin. Round cuts of beef, chicken breast, [place] breast. All fish. Grill, bake, or broil your meat. Nothing should be fried.  Eat Less Often/Avoid: Fatty cuts of meat, [place], or chicken leg, thigh, or wing. Fried cuts of meat or fish. Dairy (2 to 3 servings)  Eat More Often: Low-fat or fat-free milk, low-fat plain or light yogurt,  reduced-fat or part-skim cheese.  Eat Less Often/Avoid: Milk (whole, 2%).Whole milk yogurt. Full-fat cheeses. Nuts, Seeds, and Legumes (4 to 5 servings per week)  Eat More Often: All without added salt.  Eat Less Often/Avoid: Salted nuts and seeds, canned beans with added salt. Fats and Sweets (limited)  Eat More Often: Vegetable oils, tub margarines without trans fats, sugar-free gelatin. Mayonnaise and salad dressings.  Eat Less Often/Avoid: Coconut oils, palm oils, butter, stick margarine, cream, half and half, cookies, candy, pie. FOR MORE INFORMATION The Dash Diet Eating Plan: www.dashdiet.org Document Released: 12/20/2010 Document Revised: 03/25/2011 Document Reviewed: 12/20/2010 [facility] Patient Information 2013 [place].

## 2012-04-21 ENCOUNTER — Encounter: Payer: Self-pay | Admitting: Family Medicine

## 2012-08-04 ENCOUNTER — Other Ambulatory Visit: Payer: Self-pay

## 2012-08-04 DIAGNOSIS — Z1231 Encounter for screening mammogram for malignant neoplasm of breast: Secondary | ICD-10-CM

## 2012-08-19 ENCOUNTER — Ambulatory Visit
Admission: RE | Admit: 2012-08-19 | Discharge: 2012-08-19 | Disposition: A | Discharge status: Home / Self Care | Payer: 59 | Source: Ambulatory Visit

## 2012-08-19 DIAGNOSIS — Z1231 Encounter for screening mammogram for malignant neoplasm of breast: Secondary | ICD-10-CM

## 2012-09-29 ENCOUNTER — Ambulatory Visit: Payer: 59

## 2012-09-29 ENCOUNTER — Ambulatory Visit (INDEPENDENT_AMBULATORY_CARE_PROVIDER_SITE_OTHER): Payer: 59 | Admitting: Family Medicine

## 2012-09-29 VITALS — BP 126/82 | HR 92 | Temp 98.4°F | Resp 17 | Ht 65.5 in | Wt 183.0 lb

## 2012-09-29 DIAGNOSIS — M25562 Pain in left knee: Secondary | ICD-10-CM

## 2012-09-29 DIAGNOSIS — M25569 Pain in unspecified knee: Secondary | ICD-10-CM

## 2012-09-29 NOTE — Progress Notes (Signed)
  Subjective:    [name], female    DOB: Jan 07, 1946, 67 y.o.   MRN: [id number]  HPI Jo [name] is a 67 y.o. female  L knee popping  - noted after work getting out of vehicle about 4 days ago - felt sharp pain in knee form low back. No known injury, no fall, noticed clicking/popping in knee the next day. No giving way or locking.  No prior knee problems. No swelling. Pain has resolved in back, leg and knee - just popping.   Tx: none.   Review of Systems  Constitutional: Negative for fever and chills.  Musculoskeletal: Positive for arthralgias (initiallly - now improved. ). Negative for joint swelling and gait problem.  Skin: Negative for color change, rash and wound.       Objective:   Physical Exam  Vitals reviewed. Constitutional: She is oriented to person, place, and time. She appears well-developed and well-nourished. No distress.  Pulmonary/Chest: Effort normal.  Musculoskeletal: She exhibits no edema.       Right knee: She exhibits normal range of motion. No tenderness found.       Left knee: She exhibits abnormal meniscus (clunk palpable medial L knee with flex/IR. ). She exhibits normal range of motion, no swelling, no effusion, no ecchymosis, no deformity, no LCL laxity, normal patellar mobility, no bony tenderness and no MCL laxity. No tenderness found.  nvi distally.   Neurological: She is alert and oriented to person, place, and time.  Skin: Skin is warm and dry.  Psychiatric: She has a normal mood and affect. Her behavior is normal.        [facility] reading (PRIMARY) by  Dr. Carlota Raspberry: L knee: NAD.    Assessment & Plan:  Jo [name] is a 67 y.o. female Knee pain, acute, left - Plan: DG Knee Complete 4 Views Left  L knee pain/popping - now just popping - possible small degenerative meniscal tear, but no pain or instability at present or apparent effusion.  Sx care with tylenol or low dose ibuprofen if needed. Recheck in next 2 weeks if not improving  - sooner if worse. rtc precautions.   Patient Instructions  Your xrays look ok. You can take over the counter tylenol if needed, or low dose ibuprofen if needed. Recheck with me in next 2 weeks if not much improved.  Return to the clinic or go to the nearest emergency room if any of your symptoms worsen or new symptoms occur.

## 2012-09-29 NOTE — Patient Instructions (Signed)
Your xrays look ok. You can take over the counter tylenol if needed, or low dose ibuprofen if needed. Recheck with me in next 2 weeks if not much improved.  Return to the clinic or go to the nearest emergency room if any of your symptoms worsen or new symptoms occur.

## 2012-11-02 ENCOUNTER — Encounter: Payer: Self-pay | Admitting: Internal Medicine

## 2012-11-02 ENCOUNTER — Ambulatory Visit (INDEPENDENT_AMBULATORY_CARE_PROVIDER_SITE_OTHER): Payer: 59 | Admitting: Internal Medicine

## 2012-11-02 VITALS — BP 122/76 | HR 65 | Temp 98.3°F | Resp 16 | Ht 65.5 in | Wt 178.0 lb

## 2012-11-02 DIAGNOSIS — Z79899 Other long term (current) drug therapy: Secondary | ICD-10-CM

## 2012-11-02 DIAGNOSIS — Z23 Encounter for immunization: Secondary | ICD-10-CM

## 2012-11-02 DIAGNOSIS — D649 Anemia, unspecified: Secondary | ICD-10-CM

## 2012-11-02 DIAGNOSIS — Z0000 Encounter for general adult medical examination without abnormal findings: Secondary | ICD-10-CM

## 2012-11-02 DIAGNOSIS — Z139 Encounter for screening, unspecified: Secondary | ICD-10-CM

## 2012-11-02 DIAGNOSIS — E049 Nontoxic goiter, unspecified: Secondary | ICD-10-CM

## 2012-11-02 DIAGNOSIS — E782 Mixed hyperlipidemia: Secondary | ICD-10-CM

## 2012-11-02 DIAGNOSIS — I10 Essential (primary) hypertension: Secondary | ICD-10-CM

## 2012-11-02 LAB — POCT URINALYSIS DIPSTICK
Bilirubin, UA: NEGATIVE
Blood, UA: NEGATIVE
Glucose, UA: NEGATIVE
Ketones, UA: NEGATIVE
Leukocytes, UA: NEGATIVE
Nitrite, UA: NEGATIVE
Spec Grav, UA: 1.015
Urobilinogen, UA: 0.2
pH, UA: 8.5

## 2012-11-02 LAB — COMPREHENSIVE METABOLIC PANEL
ALT: 15 U/L (ref 0–35)
AST: 21 U/L (ref 0–37)
Albumin: 4 g/dL (ref 3.5–5.2)
Alkaline Phosphatase: 66 U/L (ref 39–117)
BUN: 17 mg/dL (ref 6–23)
CO2: 29 mEq/L (ref 19–32)
Calcium: 9.7 mg/dL (ref 8.4–10.5)
Chloride: 101 mEq/L (ref 96–112)
Creat: 1.02 mg/dL (ref 0.50–1.10)
Glucose, Bld: 90 mg/dL (ref 70–99)
Potassium: 4.5 mEq/L (ref 3.5–5.3)
Sodium: 136 mEq/L (ref 135–145)
Total Bilirubin: 0.5 mg/dL (ref 0.3–1.2)
Total Protein: 7.3 g/dL (ref 6.0–8.3)

## 2012-11-02 LAB — LIPID PANEL
Cholesterol: 180 mg/dL (ref 0–200)
HDL: 60 mg/dL (ref >39)
LDL Cholesterol: 106 mg/dL — ABNORMAL HIGH (ref 0–99)
Total CHOL/HDL Ratio: 3 Ratio
Triglycerides: 70 mg/dL (ref <150)
VLDL: 14 mg/dL (ref 0–40)

## 2012-11-02 LAB — POCT UA - MICROSCOPIC ONLY
Bacteria, U Microscopic: NEGATIVE
Casts, Ur, LPF, POC: NEGATIVE
Crystals, Ur, HPF, POC: NEGATIVE
Mucus, UA: NEGATIVE
RBC, urine, microscopic: NEGATIVE
Yeast, UA: NEGATIVE

## 2012-11-02 LAB — TSH: TSH: 1.241 u[IU]/mL (ref 0.350–4.500)

## 2012-11-02 NOTE — Progress Notes (Signed)
  Subjective:    [name], female    DOB: 12/07/1945, 67 y.o.   MRN: [id number]  HPI Doing well, HTN controlled, was anemic and needs f/up cbc, cholesterol controlled. Left knee new popping, no pain, no swelling. No trauma. UTD mammogram/colonoscopy  Review of Systems  Constitutional: Negative.   HENT: Negative.   Eyes: Negative.   Respiratory: Negative.   Cardiovascular: Negative.   Gastrointestinal: Negative.   Endocrine: Negative.   Genitourinary: Negative.   Musculoskeletal: Negative.   Skin: Negative.   Allergic/Immunologic: Positive for environmental allergies.  Neurological: Negative.   Hematological: Negative.   Psychiatric/Behavioral: Negative.        Objective:   Physical Exam  Vitals reviewed. Constitutional: She is oriented to person, place, and time. She appears well-developed and well-nourished.  HENT:  Head: Normocephalic.  Right Ear: External ear normal.  Left Ear: External ear normal.  Mouth/Throat: Oropharynx is clear and moist.  Eyes: Conjunctivae and EOM are normal. Pupils are equal, round, and reactive to light.  Neck: Normal range of motion. Neck supple. No tracheal deviation present. Thyromegaly present.  Cardiovascular: Normal rate, regular rhythm, normal heart sounds and intact distal pulses.   Pulmonary/Chest: Effort normal and breath sounds normal. Right breast exhibits no inverted nipple, no mass, no nipple discharge, no skin change and no tenderness. Left breast exhibits no inverted nipple, no mass, no nipple discharge, no skin change and no tenderness. Breasts are symmetrical.  Abdominal: Soft. Bowel sounds are normal. She exhibits no mass. There is no tenderness.  Musculoskeletal: Normal range of motion.       Left knee: She exhibits abnormal meniscus and MCL laxity. She exhibits normal range of motion, no swelling, no effusion, no ecchymosis, no deformity, no laceration, no erythema, normal alignment, no LCL laxity, normal  patellar mobility and no bony tenderness. No tenderness found. No medial joint line, no lateral joint line, no MCL, no LCL and no patellar tendon tenderness noted.  Lymphadenopathy:    She has no cervical adenopathy.  Neurological: She is alert and oriented to person, place, and time. No cranial nerve deficit. She exhibits normal muscle tone. Coordination normal.  Psychiatric: She has a normal mood and affect. Her behavior is normal. Thought content normal.   Results for orders placed in visit on 11/02/12  POCT URINALYSIS DIPSTICK      Result Value Range   Color, UA yellow     Clarity, UA clear     Glucose, UA neg     Bilirubin, UA neg     Ketones, UA neg     Spec Grav, UA 1.015     Blood, UA neg     pH, UA 8.5     Protein, UA trace     Urobilinogen, UA 0.2     Nitrite, UA neg     Leukocytes, UA Negative    POCT UA - MICROSCOPIC ONLY      Result Value Range   WBC, Ur, HPF, POC 0-1     RBC, urine, microscopic neg     Bacteria, U Microscopic neg     Mucus, UA neg     Epithelial cells, urine per micros 0-1     Crystals, Ur, HPF, POC neg     Casts, Ur, LPF, POC neg     Yeast, UA neg            Assessment & Plan:  Doing great RF meds 1 yr

## 2012-11-02 NOTE — Patient Instructions (Signed)
Meniscus Tear with Phase I Rehab The meniscus is a C-shaped cartilage structure, located in the knee joint between the thigh bone (femur) and the shinbone (tibia). Two menisci are located in each knee joint: the inner and outer meniscus. The meniscus acts as an adapter between the thigh bone and shinbone, allowing them to fit properly together. It also functions as a shock absorber, to reduce the stress placed on the knee joint and to help supply nutrients to the knee joint cartilage. As people age, the meniscus begins to harden and become more vulnerable to injury. Meniscus tears are a common injury, especially in older athletes. Inner meniscus tears are more common than outer meniscus tears.  SYMPTOMS   Pain in the knee, especially with standing or squatting with the affected leg.  Tenderness along the joint line.  Swelling in the knee joint (effusion), usually starting 1 to 2 days after injury.  Locking or catching of the knee joint, causing inability to straighten the knee completely.  Giving way or buckling of the knee. CAUSES  A meniscus tear occurs when a force is placed on the meniscus that is greater than it can handle. Common causes of injury include:  Direct hit (trauma) to the knee.  Twisting, pivoting, or cutting (rapidly changing direction while running), kneeling or squatting.  Without injury, due to aging. RISK INCREASES WITH:  Contact sports (football, rugby).  Sports in which cleats are used with pivoting (soccer, lacrosse) or sports in which good shoe grip and sudden change in direction are required (racquetball, basketball, squash).  Previous knee injury.  Associated knee injury, particularly ligament injuries.  Poor strength and flexibility. PREVENTION  Warm up and stretch properly before activity.  Maintain physical fitness:  Strength, flexibility, and endurance.  Cardiovascular fitness.  Protect the knee with a brace or elastic bandage.  Wear  properly fitted protective equipment (proper cleats for the surface). PROGNOSIS  Sometimes, meniscus tears heal on their own. However, definitive treatment requires surgery, followed by at least 6 weeks of recovery.  RELATED COMPLICATIONS   Recurring symptoms that result in a chronic problem.  Repeated knee injury, especially if sports are resumed too soon after injury or surgery.  Progression of the tear (the tear gets larger), if untreated.  Arthritis of the knee in later years (with or without surgery).  Complications of surgery, including infection, bleeding, injury to nerves (numbness, weakness, paralysis) continued pain, giving way, locking, nonhealing of meniscus (if repaired), need for further surgery, and knee stiffness (loss of motion). TREATMENT  Treatment first involves the use of ice and medicine, to reduce pain and inflammation. You may find using crutches to walk more comfortable. However, it is okay to bear weight on the injured knee, if the pain will allow it. Surgery is often advised as a definitive treatment. Surgery is performed through an incision near the joint (arthroscopically). The torn piece of the meniscus is removed, and if possible the joint cartilage is repaired. After surgery, the joint must be restrained. After restraint, it is important to perform strengthening and stretching exercises to help regain strength and a full range of motion. These exercises may be completed at home or with a therapist.  MEDICATION  If pain medicine is needed, nonsteroidal anti-inflammatory medicines (aspirin and ibuprofen), or other minor pain relievers (acetaminophen), are often advised.  Do not take pain medicine for 7 days before surgery.  Prescription pain relievers may be given, if your caregiver thinks they are needed. Use only as directed and   only as much as you need. HEAT AND COLD  Cold treatment (icing) should be applied for 10 to 15 minutes every 2 to 3 hours for  inflammation and pain, and immediately after activity that aggravates your symptoms. Use ice packs or an ice massage.  Heat treatment may be used before performing stretching and strengthening activities prescribed by your caregiver, physical therapist, or athletic trainer. Use a heat pack or a warm water soak. SEEK MEDICAL CARE IF:   Symptoms get worse or do not improve in 2 weeks, despite treatment.  New, unexplained symptoms develop. (Drugs used in treatment may produce side effects.) EXERCISES RANGE OF MOTION (ROM) AND STRETCHING EXERCISES - Meniscus Tear, Non-operative, Phase I These are some of the initial exercises with which you may start your rehabilitation program, until you see your caregiver again or until your symptoms are resolved. Remember:   These initial exercises are intended to be gentle. They will help you restore motion without increasing any swelling.  Completing these exercises allows less painful movement and prepares you for the more aggressive strengthening exercises in Phase II.  An effective stretch should be held for at least 30 seconds.  A stretch should never be painful. You should only feel a gentle lengthening or release in the stretched tissue. RANGE OF MOTION - Knee Flexion, Active  Lie on your back with both knees straight. (If this causes back discomfort, bend your healthy knee, placing your foot flat on the floor.)  Slowly slide your heel back toward your buttocks until you feel a gentle stretch in the front of your knee or thigh.  Hold for __________ seconds. Slowly slide your heel back to the starting position. Repeat __________ times. Complete this exercise __________ times per day.  RANGE OF MOTION - Knee Flexion and Extension, Active-Assisted  Sit on the edge of a table or chair with your thighs firmly supported. It may be helpful to place a folded towel under the end of your right / left thigh.  Flexion (bending): Place the ankle of your  healthy leg on top of the other ankle. Use your healthy leg to gently bend your right / left knee until you feel a mild tension across the top of your knee.  Hold for __________ seconds.  Extension (straightening): Switch your ankles so your right / left leg is on top. Use your healthy leg to straighten your right / left knee until you feel a mild tension on the backside of your knee.  Hold for __________ seconds. Repeat __________ times. Complete __________ times per day. STRETCH - Knee Flexion, Supine  Lie on the floor with your right / left heel and foot lightly touching the wall. (Place both feet on the wall if you do not use a door frame.)  Without using any effort, allow gravity to slide your foot down the wall slowly until you feel a gentle stretch in the front of your right / left knee.  Hold this stretch for __________ seconds. Then return the leg to the starting position, using your healthy leg for help, if needed. Repeat __________ times. Complete this stretch __________ times per day.  STRETCH - Knee Extension Sitting  Sit with your right / left leg/heel propped on another chair, coffee table, or foot stool.  Allow your leg muscles to relax, letting gravity straighten out your knee.*  You should feel a stretch behind your right / left knee. Hold this position for __________ seconds. Repeat __________ times. Complete this stretch __________   times per day.  *Your physician, physical therapist or athletic trainer may instruct you place a __________ weight on your thigh, just above your kneecap, to deepen the stretch.  STRENGTHENING EXERCISES - Meniscus Tear, Non-operative, Phase I These exercises may help you when beginning to rehabilitate your injury. They may resolve your symptoms with or without further involvement from your physician, physical therapist or athletic trainer. While completing these exercises, remember:   Muscles can gain both the endurance and the strength  needed for everyday activities through controlled exercises.  Complete these exercises as instructed by your physician, physical therapist or athletic trainer. Progress the resistance and repetitions only as guided. STRENGTH - Quadriceps, Isometrics  Lie on your back with your right / left leg extended and your opposite knee bent.  Gradually tense the muscles in the front of your right / left thigh. You should see either your knee cap slide up toward your hip or increased dimpling just above the knee. This motion will push the back of the knee down toward the floor, mat, or bed on which you are lying.  Hold the muscle as tight as you can, without increasing your pain, for __________ seconds.  Relax the muscles slowly and completely between each repetition. Repeat __________ times. Complete this exercise __________ times per day.  STRENGTH - Quadriceps, Short Arcs   Lie on your back. Place a __________ inch towel roll under your right / left knee, so that the knee bends slightly.  Raise only your lower leg by tightening the muscles in the front of your thigh. Do not allow your thigh to rise.  Hold this position for __________ seconds. Repeat __________ times. Complete this exercise __________ times per day.  OPTIONAL ANKLE WEIGHTS: Begin with ____________________, but DO NOT exceed ____________________. Increase in 1 pound/0.5 kilogram increments. STRENGTH - Quadriceps, Straight Leg Raises  Quality counts! Watch for signs that the quadriceps muscle is working, to be sure you are strengthening the correct muscles and not "cheating" by substituting with healthier muscles.  Lay on your back with your right / left leg extended and your opposite knee bent.  Tense the muscles in the front of your right / left thigh. You should see either your knee cap slide up or increased dimpling just above the knee. Your thigh may even shake a bit.  Tighten these muscles even more and raise your leg 4 to 6  inches off the floor. Hold for __________ seconds.  Keeping these muscles tense, lower your leg.  Relax the muscles slowly and completely in between each repetition. Repeat __________ times. Complete this exercise __________ times per day.  STRENGTH - Hamstring, Curls   Lay on your stomach with your legs extended. (If you lay on a bed, your feet may hang over the edge.)  Tighten the muscles in the back of your thigh to bend your right / left knee up to 90 degrees. Keep your hips flat on the bed.  Hold this position for __________ seconds.  Slowly lower your leg back to the starting position. Repeat __________ times. Complete this exercise __________ times per day.  STRENGTH  Quadriceps, Squats  Stand in a door frame so that your feet and knees are in line with the frame.  Use your hands for balance, not support, on the frame.  Slowly lower your weight, bending at the hips and knees. Keep your lower legs upright so that they are parallel with the door frame. Squat only within the range that does   not increase your knee pain. Never let your hips drop below your knees.  Slowly return upright, pushing with your legs, not pulling with your hands. Repeat __________ times. Complete this exercise __________ times per day.  STRENGTH - Quad/VMO, Isometric   Sit in a chair with your right / left knee slightly bent. With your fingertips, feel the VMO muscle just above the inside of your knee. The VMO is important in controlling the position of your kneecap.  Keeping your fingertips on this muscle. Without actually moving your leg, attempt to drive your knee down as if straightening your leg. You should feel your VMO tense. If you have a difficult time, you may wish to try the same exercise on your healthy knee first.  Tense this muscle as hard as you can without increasing any knee pain.  Hold for __________ seconds. Relax the muscles slowly and completely in between each repetition. Repeat  __________ times. Complete exercise __________ times per day.  Document Released: 01/14/1998 Document Revised: 03/25/2011 Document Reviewed: 04/14/2008 [employer] Patient Information 2014 [employer].    

## 2013-05-03 ENCOUNTER — Ambulatory Visit (INDEPENDENT_AMBULATORY_CARE_PROVIDER_SITE_OTHER): Payer: 59 | Admitting: Family Medicine

## 2013-05-03 ENCOUNTER — Encounter: Payer: Self-pay | Admitting: Family Medicine

## 2013-05-03 ENCOUNTER — Other Ambulatory Visit: Payer: Self-pay | Admitting: Family Medicine

## 2013-05-03 VITALS — BP 130/80 | HR 70 | Temp 98.1°F | Resp 16 | Ht 66.5 in | Wt 185.0 lb

## 2013-05-03 DIAGNOSIS — E7800 Pure hypercholesterolemia, unspecified: Secondary | ICD-10-CM

## 2013-05-03 DIAGNOSIS — Z862 Personal history of diseases of the blood and blood-forming organs and certain disorders involving the immune mechanism: Secondary | ICD-10-CM

## 2013-05-03 DIAGNOSIS — I10 Essential (primary) hypertension: Secondary | ICD-10-CM

## 2013-05-03 LAB — CBC
HCT: 32.8 % — ABNORMAL LOW (ref 36.0–46.0)
Hemoglobin: 10.9 g/dL — ABNORMAL LOW (ref 12.0–15.0)
MCH: 28.3 pg (ref 26.0–34.0)
MCHC: 33.2 g/dL (ref 30.0–36.0)
MCV: 85.2 fL (ref 78.0–100.0)
Platelets: 223 10*3/uL (ref 150–400)
RBC: 3.85 MIL/uL — ABNORMAL LOW (ref 3.87–5.11)
RDW: 14.2 % (ref 11.5–15.5)
WBC: 6.8 10*3/uL (ref 4.0–10.5)

## 2013-05-03 LAB — BASIC METABOLIC PANEL
BUN: 19 mg/dL (ref 6–23)
CO2: 27 mEq/L (ref 19–32)
Calcium: 9 mg/dL (ref 8.4–10.5)
Chloride: 105 mEq/L (ref 96–112)
Creat: 0.89 mg/dL (ref 0.50–1.10)
Glucose, Bld: 81 mg/dL (ref 70–99)
Potassium: 4.2 mEq/L (ref 3.5–5.3)
Sodium: 141 mEq/L (ref 135–145)

## 2013-05-03 MED ORDER — ATORVASTATIN CALCIUM 20 MG PO TABS: 20.0000 mg | ORAL_TABLET | Freq: Every day | ORAL | Status: DC

## 2013-05-03 MED ORDER — LISINOPRIL 10 MG PO TABS: 10.0000 mg | ORAL_TABLET | Freq: Every day | ORAL | Status: DC

## 2013-05-03 NOTE — Progress Notes (Signed)
[facility] [address] [place] [address] [phone]  Date:  05/03/2013   Name:  Jo [name]   DOB:  1945-09-14   MRN:  [id number]  PCP:  Kennon Portela, MD    Chief Complaint: Follow-up and Medication Refill   History of Present Illness:  Jo [name] is a 68 y.o. very pleasant female patient who presents with the following:  Here today for a 6 month follow-up appt.  History of HTN, high cholesterol.  Last labs in October- looked very good.   She is doing well, just here for a periodic check up today She may check her BP the drug store on occasion, does not have a home cuff.  Generally around 130/80 like today She is taking iron as tolerated; she can only take it so much as constipation is an issue No shingles vaccine yet  Patient Active Problem List   Diagnosis Date Noted  . Hypertension   . Hyperlipidemia   . Reflux   . Anemia     Past Medical History  Diagnosis Date  . Hypertension   . Hyperlipidemia   . Reflux   . Anemia   . Menopause age 9  . Goiter   . Spinal stenosis     Past Surgical History  Procedure Laterality Date  . Cervical spine surgery  06/2010    History  Substance Use Topics  . Smoking status: Never Smoker   . Smokeless tobacco: Not on file  . Alcohol Use: No     Comment: glass of wine-special occasions    Family History  Problem Relation Age of Onset  . Stroke Mother   . Hypertension Mother   . Heart disease Father   . Hypertension    . Cancer      lung x 2  . Hypertension Daughter     Allergies  Allergen Reactions  . Demerol Nausea And Vomiting    VERY SICK    Medication list has been reviewed and updated.  Current Outpatient Prescriptions on File Prior to Visit  Medication Sig Dispense Refill  . aspirin 81 MG tablet Take 81 mg by mouth daily.      Marland Kitchen atorvastatin (LIPITOR) 20 MG tablet Take 1 tablet (20 mg total) by mouth daily.  90 tablet  3  . Cholecalciferol (VITAMIN D3 PO)  Take 2,000 Units by mouth daily.       Marland Kitchen lisinopril (PRINIVIL,ZESTRIL) 10 MG tablet Take 1 tablet (10 mg total) by mouth daily.  90 tablet  3  . magnesium 30 MG tablet Take 30 mg by mouth 2 (two) times daily.      . Multiple Vitamin (MULTIVITAMIN) tablet Take 1 tablet by mouth daily.       No current facility-administered medications on file prior to visit.    Review of Systems:  As per HPI- otherwise negative.   Physical Examination: Filed Vitals:   05/03/13 0825  BP: 130/80  Pulse: 70  Temp: 98.1 F (36.7 C)  Resp: 16   Filed Vitals:   05/03/13 0825  Height: 5' 6.5" (1.689 m)  Weight: 185 lb (83.915 kg)   Body mass index is 29.42 kg/(m^2). Ideal Body Weight: Weight in (lb) to have BMI = 25: 156.9  GEN: WDWN, NAD, Non-toxic, A & O x 3, overweight and looks well HEENT: Atraumatic, Normocephalic. Neck supple. No masses, No LAD. Ears and Nose: No external deformity. CV: RRR, No M/G/R. No JVD. No thrill. No  extra heart sounds. PULM: CTA B, no wheezes, crackles, rhonchi. No retractions. No resp. distress. No accessory muscle use. EXTR: No c/c/e NEURO Normal gait.  PSYCH: Normally interactive. Conversant. Not depressed or anxious appearing.  Calm demeanor.    Assessment and Plan: HTN (hypertension) - Plan: lisinopril (PRINIVIL,ZESTRIL) 10 MG tablet, Basic metabolic panel  High cholesterol - Plan: atorvastatin (LIPITOR) 20 MG tablet  History of anemia - Plan: CBC  BP is controlled. Check labs as above.  Plan recheck here in about 6 months.  Gave rx for zostavax and encouraged her to have this done soon Will plan further follow- up pending labs.    Signed Lamar Blinks, MD

## 2013-05-03 NOTE — Patient Instructions (Signed)
Good to see you today!  Your BP looks fine I will be in touch with your labs Please get your shingles vaccine at your convenience.   Come and see [place] again in about 6 months.  At you next visit we can do a fasting cholesterol panel again

## 2013-05-04 LAB — FERRITIN: Ferritin: 59 ng/mL (ref 10–291)

## 2013-05-05 ENCOUNTER — Encounter: Payer: Self-pay | Admitting: Family Medicine

## 2013-10-05 ENCOUNTER — Other Ambulatory Visit: Payer: Self-pay

## 2013-10-05 DIAGNOSIS — Z1231 Encounter for screening mammogram for malignant neoplasm of breast: Secondary | ICD-10-CM

## 2013-10-14 ENCOUNTER — Ambulatory Visit
Admission: RE | Admit: 2013-10-14 | Discharge: 2013-10-14 | Disposition: A | Discharge status: Home / Self Care | Payer: 59 | Source: Ambulatory Visit

## 2013-10-14 DIAGNOSIS — Z1231 Encounter for screening mammogram for malignant neoplasm of breast: Secondary | ICD-10-CM

## 2013-11-22 ENCOUNTER — Encounter: Payer: Self-pay | Admitting: Family Medicine

## 2013-11-22 ENCOUNTER — Ambulatory Visit (INDEPENDENT_AMBULATORY_CARE_PROVIDER_SITE_OTHER): Payer: 59 | Admitting: Family Medicine

## 2013-11-22 VITALS — BP 150/90 | HR 74 | Temp 98.4°F | Resp 16 | Ht 65.5 in | Wt 189.4 lb

## 2013-11-22 DIAGNOSIS — I10 Essential (primary) hypertension: Secondary | ICD-10-CM

## 2013-11-22 DIAGNOSIS — Z23 Encounter for immunization: Secondary | ICD-10-CM

## 2013-11-22 DIAGNOSIS — Z0000 Encounter for general adult medical examination without abnormal findings: Secondary | ICD-10-CM

## 2013-11-22 DIAGNOSIS — Z862 Personal history of diseases of the blood and blood-forming organs and certain disorders involving the immune mechanism: Secondary | ICD-10-CM

## 2013-11-22 DIAGNOSIS — E785 Hyperlipidemia, unspecified: Secondary | ICD-10-CM

## 2013-11-22 DIAGNOSIS — R635 Abnormal weight gain: Secondary | ICD-10-CM

## 2013-11-22 LAB — CBC
HCT: 36.5 % (ref 36.0–46.0)
Hemoglobin: 12 g/dL (ref 12.0–15.0)
MCH: 28.9 pg (ref 26.0–34.0)
MCHC: 32.9 g/dL (ref 30.0–36.0)
MCV: 88 fL (ref 78.0–100.0)
Platelets: 263 10*3/uL (ref 150–400)
RBC: 4.15 MIL/uL (ref 3.87–5.11)
RDW: 14.4 % (ref 11.5–15.5)
WBC: 6.4 10*3/uL (ref 4.0–10.5)

## 2013-11-22 NOTE — Patient Instructions (Signed)
I will be in touch with your labs asap.   Work on getting your weight down to about 175 lbs again,  I think this will also help with your blood pressure You got your flu shot and prevnar (penumonia) shots today.

## 2013-11-22 NOTE — Progress Notes (Signed)
[facility] [address], [place] [address] [phone]  Date:  11/22/2013   Name:  Jo [name]   DOB:  Nov 22, 1945   MRN:  [id number]  PCP:  Kennon Portela, MD    Chief Complaint: Annual Exam   History of Present Illness:  Jo [name] is a 68 y.o. very pleasant female patient who presents with the following:  She is here today for a CPE.   She has a history of HTN and high cholesterol, anemia She is fasting today for labs She is doing well overall but knows she needs to do a better job with exercise.  Her job is stressful and she thinks this may be contributing to her BP  Flu shot is done.  She has had pneumovax but not prevnar yet  Wt Readings from Last 3 Encounters:  11/22/13 189 lb 6.4 oz (85.911 kg)  05/03/13 185 lb (83.915 kg)  11/02/12 178 lb (80.74 kg)    Patient Active Problem List   Diagnosis Date Noted  . Hypertension   . Hyperlipidemia   . Reflux   . Anemia     Past Medical History  Diagnosis Date  . Hypertension   . Hyperlipidemia   . Reflux   . Anemia   . Menopause age 48  . Goiter   . Spinal stenosis     Past Surgical History  Procedure Laterality Date  . Cervical spine surgery  06/2010    History  Substance Use Topics  . Smoking status: Never Smoker   . Smokeless tobacco: Not on file  . Alcohol Use: No     Comment: glass of wine-special occasions    Family History  Problem Relation Age of Onset  . Stroke Mother   . Hypertension Mother   . Heart disease Father   . Hypertension    . Cancer      lung x 2  . Hypertension Daughter     Allergies  Allergen Reactions  . Demerol Nausea And Vomiting    VERY SICK    Medication list has been reviewed and updated.  Current Outpatient Prescriptions on File Prior to Visit  Medication Sig Dispense Refill  . aspirin 81 MG tablet Take 81 mg by mouth daily.    Marland Kitchen atorvastatin (LIPITOR) 20 MG tablet Take 1 tablet (20 mg total) by mouth daily. 90 tablet 3   . Cholecalciferol (VITAMIN D3 PO) Take 2,000 Units by mouth daily.     . ferrous fumarate (HEMOCYTE - 106 MG FE) 325 (106 FE) MG TABS tablet Take 1 tablet by mouth.    Marland Kitchen lisinopril (PRINIVIL,ZESTRIL) 10 MG tablet Take 1 tablet (10 mg total) by mouth daily. 90 tablet 3  . magnesium 30 MG tablet Take 30 mg by mouth 2 (two) times daily.    . Multiple Vitamin (MULTIVITAMIN) tablet Take 1 tablet by mouth daily.     No current facility-administered medications on file prior to visit.    Review of Systems:  As per HPI- otherwise negative.   Physical Examination: Filed Vitals:   11/22/13 1101  BP: 152/92  Pulse: 74  Temp: 98.4 F (36.9 C)  Resp: 16   Filed Vitals:   11/22/13 1101  Height: 5' 5.5" (1.664 m)  Weight: 189 lb 6.4 oz (85.911 kg)   Body mass index is 31.03 kg/(m^2). Ideal Body Weight: Weight in (lb) to have BMI = 25: 152.2  GEN: WDWN, NAD, Non-toxic, A & O x  3, obese, looks well HEENT: Atraumatic, Normocephalic. Neck supple. No masses, No LAD.  Bilateral TM wnl, oropharynx normal.  PEERL,EOMI.   Ears and Nose: No external deformity. CV: RRR, No M/G/R. No JVD. No thrill. No extra heart sounds. PULM: CTA B, no wheezes, crackles, rhonchi. No retractions. No resp. distress. No accessory muscle use. ABD: S, NT, ND. No rebound. No HSM. EXTR: No c/c/e NEURO Normal gait.  PSYCH: Normally interactive. Conversant. Not depressed or anxious appearing.  Calm demeanor.    Assessment and Plan: Need for prophylactic vaccination and inoculation against influenza - Plan: Flu Vaccine QUAD 36+ mos IM  Hyperlipidemia  Essential hypertension - Plan: Comprehensive metabolic panel  Physical exam  Immunization due - Plan: Pneumococcal conjugate vaccine 13-valent IM  History of anemia - Plan: CBC  Weight gain - Plan: Lipid panel  Physical exam as above.  Given prevnar 13, encouraged weight loss and exercise.  Follow-up with labs and plan recheck in 6 months.   Signed Lamar Blinks, MD

## 2013-11-23 ENCOUNTER — Encounter: Payer: Self-pay | Admitting: Family Medicine

## 2013-11-23 LAB — COMPREHENSIVE METABOLIC PANEL
ALT: 13 U/L (ref 0–35)
AST: 19 U/L (ref 0–37)
Albumin: 3.8 g/dL (ref 3.5–5.2)
Alkaline Phosphatase: 62 U/L (ref 39–117)
BUN: 14 mg/dL (ref 6–23)
CO2: 28 mEq/L (ref 19–32)
Calcium: 9.7 mg/dL (ref 8.4–10.5)
Chloride: 103 mEq/L (ref 96–112)
Creat: 0.83 mg/dL (ref 0.50–1.10)
Glucose, Bld: 86 mg/dL (ref 70–99)
Potassium: 4.3 mEq/L (ref 3.5–5.3)
Sodium: 137 mEq/L (ref 135–145)
Total Bilirubin: 0.5 mg/dL (ref 0.2–1.2)
Total Protein: 7.1 g/dL (ref 6.0–8.3)

## 2013-11-23 LAB — LIPID PANEL
Cholesterol: 169 mg/dL (ref 0–200)
HDL: 54 mg/dL (ref >39)
LDL Cholesterol: 99 mg/dL (ref 0–99)
Total CHOL/HDL Ratio: 3.1 Ratio
Triglycerides: 79 mg/dL (ref <150)
VLDL: 16 mg/dL (ref 0–40)

## 2013-12-02 ENCOUNTER — Ambulatory Visit (INDEPENDENT_AMBULATORY_CARE_PROVIDER_SITE_OTHER): Payer: 59 | Admitting: Family Medicine

## 2013-12-02 VITALS — BP 152/90 | HR 81 | Temp 98.9°F | Resp 16 | Ht 65.5 in | Wt 188.6 lb

## 2013-12-02 DIAGNOSIS — J0110 Acute frontal sinusitis, unspecified: Secondary | ICD-10-CM

## 2013-12-02 DIAGNOSIS — R42 Dizziness and giddiness: Secondary | ICD-10-CM

## 2013-12-02 DIAGNOSIS — R519 Headache, unspecified: Secondary | ICD-10-CM

## 2013-12-02 DIAGNOSIS — R51 Headache: Secondary | ICD-10-CM

## 2013-12-02 LAB — POCT CBC
Granulocyte percent: 59 %G (ref 37–80)
HCT, POC: 36 % — AB (ref 37.7–47.9)
Hemoglobin: 11.5 g/dL — AB (ref 12.2–16.2)
Lymph, poc: 4.8 — AB (ref 0.6–3.4)
MCH, POC: 28.3 pg (ref 27–31.2)
MCHC: 31.8 g/dL (ref 31.8–35.4)
MCV: 88.9 fL (ref 80–97)
MID (cbc): 0.4 (ref 0–0.9)
MPV: 7.7 fL (ref 0–99.8)
POC Granulocyte: 7.4 — AB (ref 2–6.9)
POC LYMPH PERCENT: 37.9 %L (ref 10–50)
POC MID %: 3.1 %M (ref 0–12)
Platelet Count, POC: 270 10*3/uL (ref 142–424)
RBC: 4.05 M/uL (ref 4.04–5.48)
RDW, POC: 15 %
WBC: 12.6 10*3/uL — AB (ref 4.6–10.2)

## 2013-12-02 MED ORDER — AMOXICILLIN 500 MG PO TABS: 1000.0000 mg | ORAL_TABLET | Freq: Two times a day (BID) | ORAL | Status: DC

## 2013-12-02 MED ORDER — FLUTICASONE PROPIONATE 50 MCG/ACT NA SUSP: 2.0000 | Freq: Every day | NASAL | Status: DC

## 2013-12-02 NOTE — Patient Instructions (Signed)

## 2013-12-02 NOTE — Progress Notes (Signed)
Subjective:  This chart was scribed for Reginia Forts, MD by Randa Evens, ED Scribe. This Patient was seen in room 02 and the patients care was started at 6:54 PM  [name], female    DOB: [birth year], 68 y.o.   MRN: [id number]  12/02/2013  Headache; Dizziness; and sinus pain   HPI HPI Comments: Jo [name] is a 68 y.o. female who presents to the [facility] complaining of  gradual frontal headache onset 2 days ago. Pt rates the severity of her headache 7/10. She states she states she has associated intermittent dizziness, blurred vision when bending over. Pt states she has a productive cough of green, yellow and brown sputum, some congestion, sinus pressure and nausea from the postnasal drainage onset one week to ten days ago. She states she has tried mucinex with no relief. She states that her dizziness is worse at night when standing up. She states that her blood pressure has been elevated for the past week. Denies fever, chills, any new numbness or tingling, vomiting or diarrhea.  Denies ringing in ears or hearing loss that is new.  S/p recent follow-up with Dr. Lorelei Pont in the past two weeks.  Labs performed at that visit overall normal.   Review of Systems  Constitutional: Negative for fever, chills and diaphoresis.  HENT: Positive for congestion, postnasal drip, rhinorrhea and sinus pressure. Negative for ear pain, hearing loss, sore throat, tinnitus, trouble swallowing and voice change.   Eyes: Positive for visual disturbance.  Respiratory: Negative for cough and shortness of breath.   Cardiovascular: Negative for chest pain, palpitations and leg swelling.  Gastrointestinal: Positive for nausea. Negative for vomiting and diarrhea.  Neurological: Positive for dizziness, light-headedness and headaches. Negative for weakness and numbness.    Past Medical History  Diagnosis Date  . Hypertension   . Hyperlipidemia   . Reflux   . Anemia   .  Menopause age 77  . Goiter   . Spinal stenosis    Past Surgical History  Procedure Laterality Date  . Cervical spine surgery  06/2010   Allergies  Allergen Reactions  . Demerol Nausea And Vomiting    VERY SICK   Current Outpatient Prescriptions  Medication Sig Dispense Refill  . aspirin 81 MG tablet Take 81 mg by mouth daily.    Marland Kitchen atorvastatin (LIPITOR) 20 MG tablet Take 1 tablet (20 mg total) by mouth daily. 90 tablet 3  . Cholecalciferol (VITAMIN D3 PO) Take 2,000 Units by mouth daily.     Marland Kitchen lisinopril (PRINIVIL,ZESTRIL) 10 MG tablet Take 1 tablet (10 mg total) by mouth daily. 90 tablet 3  . Multiple Vitamin (MULTIVITAMIN) tablet Take 1 tablet by mouth daily.    Marland Kitchen amoxicillin (AMOXIL) 500 MG tablet Take 2 tablets (1,000 mg total) by mouth 2 (two) times daily. 40 tablet 0  . ferrous fumarate (HEMOCYTE - 106 MG FE) 325 (106 FE) MG TABS tablet Take 1 tablet by mouth.    . fluticasone (FLONASE) 50 MCG/ACT nasal spray Place 2 sprays into both nostrils daily. 16 g 2  . magnesium 30 MG tablet Take 30 mg by mouth 2 (two) times daily.     No current facility-administered medications for this visit.       Objective:    BP 152/90 mmHg  Pulse 81  Temp(Src) 98.9 F (37.2 C) (Oral)  Resp 16  Ht 5' 5.5" (1.664 m)  Wt 188 lb 9.6 oz (85.548 kg)  BMI  30.90 kg/m2  SpO2 98%   Physical Exam  Constitutional: She is oriented to person, place, and time. She appears well-developed and well-nourished. No distress.  HENT:  Head: Normocephalic and atraumatic.  Right Ear: External ear normal.  Left Ear: External ear normal.  Nose: Mucosal edema and rhinorrhea present. Right sinus exhibits frontal sinus tenderness. Right sinus exhibits no maxillary sinus tenderness. Left sinus exhibits frontal sinus tenderness. Left sinus exhibits no maxillary sinus tenderness.  Mouth/Throat: Uvula is midline and mucous membranes are normal. No oropharyngeal exudate, posterior oropharyngeal edema or posterior  oropharyngeal erythema.  Eyes: Conjunctivae and EOM are normal. Pupils are equal, round, and reactive to light.  Neck: Normal range of motion. Neck supple. No thyromegaly present.  Cardiovascular: Normal rate, regular rhythm and normal heart sounds.  Exam reveals no gallop and no friction rub.   No murmur heard. Pulmonary/Chest: Effort normal and breath sounds normal. No respiratory distress. She has no wheezes. She has no rales.  Musculoskeletal: Normal range of motion.  Lymphadenopathy:    She has no cervical adenopathy.  Neurological: She is alert and oriented to person, place, and time. No cranial nerve deficit. She exhibits normal muscle tone. Coordination normal.  Reflex Scores:      Patellar reflexes are 2+ on the right side and 2+ on the left side. Skin: Skin is warm and dry. She is not diaphoretic.  Psychiatric: She has a normal mood and affect. Her behavior is normal.  Nursing note and vitals reviewed.    Results for orders placed or performed in visit on 12/02/13  Comprehensive metabolic panel  Result Value Ref Range   Sodium 139 135 - 145 mEq/L   Potassium 5.0 3.5 - 5.3 mEq/L   Chloride 102 96 - 112 mEq/L   CO2 30 19 - 32 mEq/L   Glucose, Bld 94 70 - 99 mg/dL   BUN 15 6 - 23 mg/dL   Creat 0.97 0.50 - 1.10 mg/dL   Total Bilirubin 0.3 0.2 - 1.2 mg/dL   Alkaline Phosphatase 70 39 - 117 U/L   AST 16 0 - 37 U/L   ALT 11 0 - 35 U/L   Total Protein 7.4 6.0 - 8.3 g/dL   Albumin 3.8 3.5 - 5.2 g/dL   Calcium 9.6 8.4 - 10.5 mg/dL  POCT CBC  Result Value Ref Range   WBC 12.6 (A) 4.6 - 10.2 K/uL   Lymph, poc 4.8 (A) 0.6 - 3.4   POC LYMPH PERCENT 37.9 10 - 50 %L   MID (cbc) 0.4 0 - 0.9   POC MID % 3.1 0 - 12 %M   POC Granulocyte 7.4 (A) 2 - 6.9   Granulocyte percent 59.0 37 - 80 %G   RBC 4.05 4.04 - 5.48 M/uL   Hemoglobin 11.5 (A) 12.2 - 16.2 g/dL   HCT, POC 36.0 (A) 37.7 - 47.9 %   MCV 88.9 80 - 97 fL   MCH, POC 28.3 27 - 31.2 pg   MCHC 31.8 31.8 - 35.4 g/dL   RDW, POC  15.0 %   Platelet Count, POC 270 142 - 424 K/uL   MPV 7.7 0 - 99.8 fL       Assessment & Plan:   1. Acute frontal sinusitis, recurrence not specified   2. Dizziness   3. Nonintractable headache, unspecified chronicity pattern, unspecified headache type      1. Acute frontal sinusitis:  New. Rx for Amoxicillin provided; rx for Flonase also provided; continue Mucinex. 2.  Dizziness:  New.  Associated with one week of sinus congestion; treat sinusitis and expect dizziness to resolve.  Obtain labs; RTC for acute worsening. 3.  Headache: New. Associated with sinus congestion and dizziness;normal neurological exam in office.  Treat with Tylenol.  RTC for acute worsening.    Meds ordered this encounter  Medications  . amoxicillin (AMOXIL) 500 MG tablet    Sig: Take 2 tablets (1,000 mg total) by mouth 2 (two) times daily.    Dispense:  40 tablet    Refill:  0  . fluticasone (FLONASE) 50 MCG/ACT nasal spray    Sig: Place 2 sprays into both nostrils daily.    Dispense:  16 g    Refill:  2    No Follow-up on file.    I personally performed the services described in this documentation, which was scribed in my presence. The recorded information has been reviewed and considered.  Reginia Forts, M.D.  [facility] [address] [place], [place]  [phone] phone [phone] fax

## 2013-12-03 LAB — COMPREHENSIVE METABOLIC PANEL
ALT: 11 U/L (ref 0–35)
AST: 16 U/L (ref 0–37)
Albumin: 3.8 g/dL (ref 3.5–5.2)
Alkaline Phosphatase: 70 U/L (ref 39–117)
BUN: 15 mg/dL (ref 6–23)
CO2: 30 mEq/L (ref 19–32)
Calcium: 9.6 mg/dL (ref 8.4–10.5)
Chloride: 102 mEq/L (ref 96–112)
Creat: 0.97 mg/dL (ref 0.50–1.10)
Glucose, Bld: 94 mg/dL (ref 70–99)
Potassium: 5 mEq/L (ref 3.5–5.3)
Sodium: 139 mEq/L (ref 135–145)
Total Bilirubin: 0.3 mg/dL (ref 0.2–1.2)
Total Protein: 7.4 g/dL (ref 6.0–8.3)

## 2014-03-14 ENCOUNTER — Ambulatory Visit (INDEPENDENT_AMBULATORY_CARE_PROVIDER_SITE_OTHER): Payer: 59 | Admitting: Emergency Medicine

## 2014-03-14 ENCOUNTER — Telehealth: Payer: Self-pay | Admitting: *Deleted

## 2014-03-14 VITALS — BP 140/86 | HR 76 | Temp 97.9°F | Resp 18 | Ht 65.5 in | Wt 189.0 lb

## 2014-03-14 DIAGNOSIS — H8110 Benign paroxysmal vertigo, unspecified ear: Secondary | ICD-10-CM

## 2014-03-14 DIAGNOSIS — R519 Headache, unspecified: Secondary | ICD-10-CM

## 2014-03-14 DIAGNOSIS — R51 Headache: Secondary | ICD-10-CM

## 2014-03-14 NOTE — Progress Notes (Signed)
[facility] [address] [place] [address] [phone]  Date:  03/14/2014   Name:  Jo [name]   DOB:  [birth year]   MRN:  [id number]  PCP:  Kennon Portela, MD    Chief Complaint: Headache and Dizziness   History of Present Illness:  Jo [name] is a 69 y.o. very pleasant female patient who presents with the following:  Patient last seen in February with frontal sinusitis and headache.  Had dizziness. Treated and sinusitis resolved.  Has persistent headache, blurred vision and dizziness. No weakness, slurred speech, difficulty expressing her thoughts, facial asymmetry, dropping items, or falling. No fever or chills No cough or coryza.  No nasal congestion or drainage.  No sore throat No antecedent illness or injury. No nausea or vomiting. Tolerating her medication well.  No improvement with over the counter medications or other home remedies.  Denies other complaint or health concern today.   Patient Active Problem List   Diagnosis Date Noted  . Hypertension   . Hyperlipidemia   . Reflux   . Anemia     Past Medical History  Diagnosis Date  . Hypertension   . Hyperlipidemia   . Reflux   . Anemia   . Menopause age 81  . Goiter   . Spinal stenosis     Past Surgical History  Procedure Laterality Date  . Cervical spine surgery  06/2010    History  Substance Use Topics  . Smoking status: Never Smoker   . Smokeless tobacco: Not on file  . Alcohol Use: No     Comment: glass of wine-special occasions    Family History  Problem Relation Age of Onset  . Stroke Mother   . Hypertension Mother   . Heart disease Mother   . Heart disease Father   . Hypertension    . Cancer      lung x 2  . Hypertension Daughter   . Cancer Sister   . Cancer Brother   . Cancer Sister     Allergies  Allergen Reactions  . Demerol Nausea And Vomiting    VERY SICK    Medication list has been reviewed and updated.  Current Outpatient  Prescriptions on File Prior to Visit  Medication Sig Dispense Refill  . aspirin 81 MG tablet Take 81 mg by mouth daily.    Marland Kitchen atorvastatin (LIPITOR) 20 MG tablet Take 1 tablet (20 mg total) by mouth daily. 90 tablet 3  . lisinopril (PRINIVIL,ZESTRIL) 10 MG tablet Take 1 tablet (10 mg total) by mouth daily. 90 tablet 3  . Multiple Vitamin (MULTIVITAMIN) tablet Take 1 tablet by mouth daily.    Marland Kitchen amoxicillin (AMOXIL) 500 MG tablet Take 2 tablets (1,000 mg total) by mouth 2 (two) times daily. (Patient not taking: Reported on 03/14/2014) 40 tablet 0  . Cholecalciferol (VITAMIN D3 PO) Take 2,000 Units by mouth daily.     . ferrous fumarate (HEMOCYTE - 106 MG FE) 325 (106 FE) MG TABS tablet Take 1 tablet by mouth.    . fluticasone (FLONASE) 50 MCG/ACT nasal spray Place 2 sprays into both nostrils daily. (Patient not taking: Reported on 03/14/2014) 16 g 2  . magnesium 30 MG tablet Take 30 mg by mouth 2 (two) times daily.     No current facility-administered medications on file prior to visit.    Review of Systems:  As per HPI, otherwise negative.    Physical Examination: Filed Vitals:   03/14/14  1500  BP: 140/86  Pulse: 76  Temp: 97.9 F (36.6 C)  Resp: 18   Filed Vitals:   03/14/14 1500  Height: 5' 5.5" (1.664 m)  Weight: 189 lb (85.73 kg)   Body mass index is 30.96 kg/(m^2). Ideal Body Weight: Weight in (lb) to have BMI = 25: 152.2  GEN: WDWN, NAD, Non-toxic, A & O x 3 HEENT: Atraumatic, Normocephalic. Neck supple. No masses, No LAD. Ears and Nose: No external deformity. CV: RRR, No M/G/R. No JVD. No thrill. No extra heart sounds. PULM: CTA B, no wheezes, crackles, rhonchi. No retractions. No resp. distress. No accessory muscle use. ABD: S, NT, ND, +BS. No rebound. No HSM. EXTR: No c/c/e NEURO Normal gait.   Mild impairment tandem gait. Romberg normal.  PRRERLA EOMI  CN 2-12 intact PSYCH: Normally interactive. Conversant. Not depressed or anxious appearing.  Calm demeanor.     Assessment and Plan: Headache Vertigo  Mild ataxia MR  Signed,  Ellison Carwin, MD

## 2014-03-14 NOTE — Telephone Encounter (Signed)
Pt was called and advised she has an MR Brain w/o contrast scheduled for tomorrow 03/15/14 at [facility] 1st floor radiology at 3:45pm.  Pt understood and was aware of how to get to this location.

## 2014-03-15 ENCOUNTER — Ambulatory Visit (HOSPITAL_COMMUNITY)
Admission: RE | Admit: 2014-03-15 | Discharge: 2014-03-15 | Disposition: A | Discharge status: Home / Self Care | Payer: 59 | Source: Ambulatory Visit | Attending: Emergency Medicine | Admitting: Emergency Medicine

## 2014-03-15 DIAGNOSIS — R51 Headache: Secondary | ICD-10-CM | POA: Diagnosis not present

## 2014-03-15 DIAGNOSIS — R519 Headache, unspecified: Secondary | ICD-10-CM

## 2014-03-15 DIAGNOSIS — H8110 Benign paroxysmal vertigo, unspecified ear: Secondary | ICD-10-CM | POA: Insufficient documentation

## 2014-05-24 ENCOUNTER — Other Ambulatory Visit: Payer: Self-pay | Admitting: Family Medicine

## 2014-09-09 ENCOUNTER — Other Ambulatory Visit: Payer: Self-pay | Admitting: Emergency Medicine

## 2014-10-20 ENCOUNTER — Other Ambulatory Visit: Payer: Self-pay

## 2014-10-20 NOTE — Telephone Encounter (Signed)
Can we refill until then? Or does pt need to walk in for an OV?

## 2014-10-20 NOTE — Telephone Encounter (Signed)
Has been about a year since BP has been evaluated and would prefer to see in office to check and also to see if lisinopril is maintaining pressure. If has any question do call back.

## 2014-10-20 NOTE — Telephone Encounter (Signed)
Patient requesting refills on her Lisinipril and Lipitor medications. She will run out this Sunday. She has scheduled her CPE with Dr Lorelei Pont at her next opening which is 01/25/15. Patient requesting enough medication until this appointment. Patient uses [facility] on [address] and her call back number is [phone]. Patient would like a call back to let her know when it is sent to pharmacy.

## 2014-10-24 ENCOUNTER — Ambulatory Visit (INDEPENDENT_AMBULATORY_CARE_PROVIDER_SITE_OTHER): Payer: 59 | Admitting: Family Medicine

## 2014-10-24 VITALS — BP 138/88 | HR 80 | Temp 98.1°F | Resp 16 | Ht 65.5 in | Wt 188.0 lb

## 2014-10-24 DIAGNOSIS — I10 Essential (primary) hypertension: Secondary | ICD-10-CM | POA: Diagnosis not present

## 2014-10-24 DIAGNOSIS — Z119 Encounter for screening for infectious and parasitic diseases, unspecified: Secondary | ICD-10-CM | POA: Diagnosis not present

## 2014-10-24 DIAGNOSIS — E785 Hyperlipidemia, unspecified: Secondary | ICD-10-CM

## 2014-10-24 DIAGNOSIS — Z5181 Encounter for therapeutic drug level monitoring: Secondary | ICD-10-CM

## 2014-10-24 DIAGNOSIS — Z131 Encounter for screening for diabetes mellitus: Secondary | ICD-10-CM

## 2014-10-24 LAB — COMPREHENSIVE METABOLIC PANEL
ALT: 12 U/L (ref 6–29)
AST: 17 U/L (ref 10–35)
Albumin: 4 g/dL (ref 3.6–5.1)
Alkaline Phosphatase: 70 U/L (ref 33–130)
BUN: 15 mg/dL (ref 7–25)
CO2: 29 mmol/L (ref 20–31)
Calcium: 9.9 mg/dL (ref 8.6–10.4)
Chloride: 104 mmol/L (ref 98–110)
Creat: 0.93 mg/dL (ref 0.60–0.93)
Glucose, Bld: 89 mg/dL (ref 65–99)
Potassium: 5 mmol/L (ref 3.5–5.3)
Sodium: 139 mmol/L (ref 135–146)
Total Bilirubin: 0.5 mg/dL (ref 0.2–1.2)
Total Protein: 7.2 g/dL (ref 6.1–8.1)

## 2014-10-24 LAB — CBC
HCT: 35 % — ABNORMAL LOW (ref 36.0–46.0)
Hemoglobin: 11.9 g/dL — ABNORMAL LOW (ref 12.0–15.0)
MCH: 29.9 pg (ref 26.0–34.0)
MCHC: 34 g/dL (ref 30.0–36.0)
MCV: 87.9 fL (ref 78.0–100.0)
MPV: 10.6 fL (ref 8.6–12.4)
Platelets: 262 10*3/uL (ref 150–400)
RBC: 3.98 MIL/uL (ref 3.87–5.11)
RDW: 14.4 % (ref 11.5–15.5)
WBC: 7.7 10*3/uL (ref 4.0–10.5)

## 2014-10-24 LAB — LIPID PANEL
Cholesterol: 180 mg/dL (ref 125–200)
HDL: 60 mg/dL (ref >=46)
LDL Cholesterol: 107 mg/dL (ref <130)
Total CHOL/HDL Ratio: 3 Ratio (ref <=5.0)
Triglycerides: 65 mg/dL (ref <150)
VLDL: 13 mg/dL (ref <30)

## 2014-10-24 LAB — HEPATITIS C ANTIBODY: HCV Ab: NEGATIVE

## 2014-10-24 LAB — HEMOGLOBIN A1C
Hgb A1c MFr Bld: 6.2 % — ABNORMAL HIGH (ref <5.7)
Mean Plasma Glucose: 131 mg/dL — ABNORMAL HIGH (ref <117)

## 2014-10-24 MED ORDER — LISINOPRIL 10 MG PO TABS: 10.0000 mg | ORAL_TABLET | Freq: Every day | ORAL | Status: DC

## 2014-10-24 MED ORDER — ATORVASTATIN CALCIUM 20 MG PO TABS: 20.0000 mg | ORAL_TABLET | Freq: Every day | ORAL | Status: DC

## 2014-10-24 NOTE — Progress Notes (Signed)
[facility] [address] [place] [address] [phone]  Date:  10/24/2014   Name:  Jo [name]   DOB:  01-11-1946   MRN:  [id number]  PCP:  Kennon Portela, MD    Chief Complaint: Medication Refill   History of Present Illness:  Jo [name] is a 69 y.o. very pleasant female patient who presents with the following:  Here today for labs and refills  She is fasting today for labs  History of HTN and hyperlipidemia She does not want a flu shot or zostavax Immunizations are UTD She is feeling well today  She has had a dexa scan- done at [facility].  She is not sure of the date of her last dexa  BP Readings from Last 3 Encounters:  10/24/14 138/88  03/14/14 140/86  12/02/13 152/90     Patient Active Problem List   Diagnosis Date Noted  . Hypertension   . Hyperlipidemia   . Reflux   . Anemia     Past Medical History  Diagnosis Date  . Hypertension   . Hyperlipidemia   . Reflux   . Anemia   . Menopause age 59  . Goiter   . Spinal stenosis     Past Surgical History  Procedure Laterality Date  . Cervical spine surgery  06/2010    Social History  Substance Use Topics  . Smoking status: Never Smoker   . Smokeless tobacco: None  . Alcohol Use: No     Comment: glass of wine-special occasions    Family History  Problem Relation Age of Onset  . Stroke Mother   . Hypertension Mother   . Heart disease Mother   . Heart disease Father   . Hypertension    . Cancer      lung x 2  . Hypertension Daughter   . Cancer Sister   . Cancer Brother   . Cancer Sister     Allergies  Allergen Reactions  . Demerol Nausea And Vomiting    VERY SICK    Medication list has been reviewed and updated.  Current Outpatient Prescriptions on File Prior to Visit  Medication Sig Dispense Refill  . aspirin 81 MG tablet Take 81 mg by mouth daily.    Marland Kitchen atorvastatin (LIPITOR) 20 MG tablet Take 1 tablet (20 mg total) by mouth daily.  PATIENT NEEDS OFFICE VISIT FOR ADDITIONAL REFILLS 30 tablet 0  . ferrous fumarate (HEMOCYTE - 106 MG FE) 325 (106 FE) MG TABS tablet Take 1 tablet by mouth.    Marland Kitchen lisinopril (PRINIVIL,ZESTRIL) 10 MG tablet Take 1 tablet (10 mg total) by mouth daily. PATIENT NEEDS OFFICE VISIT FOR ADDITIONAL REFILLS 30 tablet 0  . Multiple Vitamin (MULTIVITAMIN) tablet Take 1 tablet by mouth daily.    . vitamin B-12 (CYANOCOBALAMIN) 1000 MCG tablet Take 1,000 mcg by mouth daily.    Marland Kitchen amoxicillin (AMOXIL) 500 MG tablet Take 2 tablets (1,000 mg total) by mouth 2 (two) times daily. (Patient not taking: Reported on 03/14/2014) 40 tablet 0  . Cholecalciferol (VITAMIN D3 PO) Take 2,000 Units by mouth daily.     . fluticasone (FLONASE) 50 MCG/ACT nasal spray Place 2 sprays into both nostrils daily. (Patient not taking: Reported on 03/14/2014) 16 g 2  . magnesium 30 MG tablet Take 30 mg by mouth 2 (two) times daily.     No current facility-administered medications on file prior to visit.    Review of Systems:  As per HPI- otherwise negative.   Physical Examination: Filed Vitals:   10/24/14 0826  BP: 138/88  Pulse: 80  Temp: 98.1 F (36.7 C)  Resp: 16   Filed Vitals:   10/24/14 0826  Height: 5' 5.5" (1.664 m)  Weight: 188 lb (85.276 kg)   Body mass index is 30.8 kg/(m^2). Ideal Body Weight: Weight in (lb) to have BMI = 25: 152.2  GEN: WDWN, NAD, Non-toxic, A & O x 3, overweight, looks well HEENT: Atraumatic, Normocephalic. Neck supple. No masses, No LAD.  PEERl, oropharynx wnl Ears and Nose: No external deformity. CV: RRR, No M/G/R. No JVD. No thrill. No extra heart sounds. PULM: CTA B, no wheezes, crackles, rhonchi. No retractions. No resp. distress. No accessory muscle use. EXTR: No c/c/e NEURO Normal gait.  PSYCH: Normally interactive. Conversant. Not depressed or anxious appearing.  Calm demeanor.    Assessment and Plan: Essential hypertension - Plan: lisinopril (PRINIVIL,ZESTRIL) 10 MG  tablet  Hyperlipidemia - Plan: atorvastatin (LIPITOR) 20 MG tablet, Lipid panel  Screening for diabetes mellitus - Plan: Hemoglobin A1c  Screening examination for infectious disease - Plan: Hepatitis C antibody  Medication monitoring encounter - Plan: CBC, Comprehensive metabolic panel  BP ok- continue current medication Refills and labs today Declines flu shot See patient instructions for more details.   Will do her hep C screening and A1c today Asked her to please find out the date of her last dexa for me if possible    Signed Lamar Blinks, MD

## 2014-10-24 NOTE — Patient Instructions (Signed)
Great to see you today!  I will be in touch with your labs Remember that your colonoscopy will come due next year If you could, please ask the breast center for the date of your last bone density- I cannot see this on my computer If it has been more than 4 or 5 years I am glad to order a repeat test for you

## 2014-10-25 ENCOUNTER — Encounter: Payer: Self-pay | Admitting: Family Medicine

## 2014-10-25 DIAGNOSIS — R7303 Prediabetes: Secondary | ICD-10-CM | POA: Insufficient documentation

## 2014-11-28 ENCOUNTER — Other Ambulatory Visit: Payer: Self-pay

## 2014-11-28 DIAGNOSIS — Z1231 Encounter for screening mammogram for malignant neoplasm of breast: Secondary | ICD-10-CM

## 2015-01-03 ENCOUNTER — Ambulatory Visit
Admission: RE | Admit: 2015-01-03 | Discharge: 2015-01-03 | Disposition: A | Discharge status: Home / Self Care | Payer: 59 | Source: Ambulatory Visit

## 2015-01-03 DIAGNOSIS — Z1231 Encounter for screening mammogram for malignant neoplasm of breast: Secondary | ICD-10-CM

## 2015-01-11 ENCOUNTER — Ambulatory Visit (INDEPENDENT_AMBULATORY_CARE_PROVIDER_SITE_OTHER): Payer: 59 | Admitting: Emergency Medicine

## 2015-01-11 VITALS — BP 132/80 | HR 81 | Temp 98.4°F | Resp 16 | Ht 65.5 in | Wt 182.8 lb

## 2015-01-11 DIAGNOSIS — J209 Acute bronchitis, unspecified: Secondary | ICD-10-CM

## 2015-01-11 DIAGNOSIS — J0140 Acute pansinusitis, unspecified: Secondary | ICD-10-CM | POA: Diagnosis not present

## 2015-01-11 MED ORDER — HYDROCOD POLST-CPM POLST ER 10-8 MG/5ML PO SUER: 5.0000 mL | Freq: Two times a day (BID) | ORAL | Status: DC

## 2015-01-11 MED ORDER — AMOXICILLIN-POT CLAVULANATE 875-125 MG PO TABS: 1.0000 | ORAL_TABLET | Freq: Two times a day (BID) | ORAL | Status: DC

## 2015-01-11 MED ORDER — PSEUDOEPHEDRINE-GUAIFENESIN ER 60-600 MG PO TB12: 1.0000 | ORAL_TABLET | Freq: Two times a day (BID) | ORAL | Status: DC

## 2015-01-11 NOTE — Patient Instructions (Signed)

## 2015-01-11 NOTE — Progress Notes (Signed)
Subjective:  [name], female    DOB: Oct 12, 1945  Age: 69 y.o. MRN: [id number]  CC: Sinusitis; Cough; Sore Throat; Chills; and Generalized Body Aches   HPI Jo [name] presents   Patient has nasal congestion postnasal drainage is purulent  In character. She has a fever chills. She's been fatigue. She been ill since Friday. She denies any nausea vomiting or stool change. No rash. She has a cough productive  purulent sputum. She has no wheezing or shortness of breath. She has no nausea. She has no improvement with over-the-counter medication  History Jo [name] has a past medical history of Hypertension; Hyperlipidemia; Reflux; Anemia; Menopause (age 52); Goiter; and Spinal stenosis.   She has past surgical history that includes Cervical spine surgery (06/2010).   Her  family history includes Cancer in her brother, sister, and sister; Heart disease in her father and mother; Hypertension in her daughter and mother; Stroke in her mother.  She   reports that she has never smoked. She does not have any smokeless tobacco history on file. She reports that she does not drink alcohol or use illicit drugs.  Outpatient Prescriptions Prior to Visit  Medication Sig Dispense Refill  . aspirin 81 MG tablet Take 81 mg by mouth daily.    Marland Kitchen atorvastatin (LIPITOR) 20 MG tablet Take 1 tablet (20 mg total) by mouth daily. 90 tablet 3  . Cholecalciferol (VITAMIN D3 PO) Take 2,000 Units by mouth daily.     . ferrous fumarate (HEMOCYTE - 106 MG FE) 325 (106 FE) MG TABS tablet Take 1 tablet by mouth.    Marland Kitchen lisinopril (PRINIVIL,ZESTRIL) 10 MG tablet Take 1 tablet (10 mg total) by mouth daily. 90 tablet 3  . magnesium 30 MG tablet Take 30 mg by mouth 2 (two) times daily.    . Multiple Vitamin (MULTIVITAMIN) tablet Take 1 tablet by mouth daily.    . vitamin B-12 (CYANOCOBALAMIN) 1000 MCG tablet Take 1,000 mcg by mouth daily.    Marland Kitchen amoxicillin (AMOXIL) 500 MG tablet Take 2 tablets (1,000 mg total)  by mouth 2 (two) times daily. (Patient not taking: Reported on 03/14/2014) 40 tablet 0  . fluticasone (FLONASE) 50 MCG/ACT nasal spray Place 2 sprays into both nostrils daily. (Patient not taking: Reported on 03/14/2014) 16 g 2   No facility-administered medications prior to visit.    Social History   Social History  . Marital Status: Widowed    Spouse Name: N/A  . Number of Children: 3  . Years of Education: college   Occupational History  . Jo [name] History Main Topics  . Smoking status: Never Smoker   . Smokeless tobacco: None  . Alcohol Use: No     Comment: glass of wine-special occasions  . Drug Use: No  . Sexual Activity: No   Other Topics Concern  . None   Social History Narrative     Review of Systems  Constitutional: Positive for fever, chills and fatigue. Negative for appetite change.  HENT: Positive for congestion, postnasal drip, rhinorrhea, sinus pressure and sore throat. Negative for ear pain.   Eyes: Negative for pain and redness.  Respiratory: Positive for cough and wheezing. Negative for shortness of breath.   Cardiovascular: Negative for leg swelling.  Gastrointestinal: Negative for nausea, vomiting, abdominal pain, diarrhea, constipation and blood in stool.  Endocrine: Negative for polyuria.  Genitourinary: Negative for dysuria, urgency, frequency and flank pain.  Musculoskeletal: Negative for gait problem.  Skin: Negative for rash.  Neurological: Negative for weakness and headaches.  Psychiatric/Behavioral: Negative for confusion and decreased concentration. The patient is not nervous/anxious.     Objective:  BP 132/80 mmHg  Pulse 81  Temp(Src) 98.4 F (36.9 C) (Oral)  Resp 16  Ht 5' 5.5" (1.664 m)  Wt 182 lb 12.8 oz (82.918 kg)  BMI 29.95 kg/m2  SpO2 96%  Physical Exam  Constitutional: She is oriented to person, place, and time. She appears well-developed and well-nourished. No distress.  HENT:  Head: Normocephalic  and atraumatic.  Right Ear: External ear normal.  Left Ear: External ear normal.  Nose: Nose normal.  Eyes: Conjunctivae and EOM are normal. Pupils are equal, round, and reactive to light. No scleral icterus.  Neck: Normal range of motion. Neck supple. No tracheal deviation present.  Cardiovascular: Normal rate, regular rhythm and normal heart sounds.   Pulmonary/Chest: Effort normal. No respiratory distress. She has no wheezes. She has no rales.  Abdominal: She exhibits no mass. There is no tenderness. There is no rebound and no guarding.  Musculoskeletal: She exhibits no edema.  Lymphadenopathy:    She has no cervical adenopathy.  Neurological: She is alert and oriented to person, place, and time. Coordination normal.  Skin: Skin is warm and dry. No rash noted.  Psychiatric: She has a normal mood and affect. Her behavior is normal.      Assessment & Plan:   Anvi was seen today for sinusitis, cough, sore throat, chills and generalized body aches.  Diagnoses and all orders for this visit:  Acute bronchitis, unspecified organism  Acute pansinusitis, recurrence not specified  Other orders -     amoxicillin-clavulanate (AUGMENTIN) 875-125 MG tablet; Take 1 tablet by mouth 2 (two) times daily. -     pseudoephedrine-guaifenesin (MUCINEX D) 60-600 MG 12 hr tablet; Take 1 tablet by mouth every 12 (twelve) hours. -     chlorpheniramine-HYDROcodone (TUSSIONEX PENNKINETIC ER) 10-8 MG/5ML SUER; Take 5 mLs by mouth 2 (two) times daily.  I am having Ms. Cole start on amoxicillin-clavulanate, pseudoephedrine-guaifenesin, and chlorpheniramine-HYDROcodone. I am also having her maintain her aspirin, multivitamin, Cholecalciferol (VITAMIN D3 PO), magnesium, ferrous fumarate, amoxicillin, fluticasone, vitamin B-12, lisinopril, and atorvastatin.  Meds ordered this encounter  Medications  . amoxicillin-clavulanate (AUGMENTIN) 875-125 MG tablet    Sig: Take 1 tablet by mouth 2 (two) times daily.      Dispense:  20 tablet    Refill:  0  . pseudoephedrine-guaifenesin (MUCINEX D) 60-600 MG 12 hr tablet    Sig: Take 1 tablet by mouth every 12 (twelve) hours.    Dispense:  18 tablet    Refill:  0  . chlorpheniramine-HYDROcodone (TUSSIONEX PENNKINETIC ER) 10-8 MG/5ML SUER    Sig: Take 5 mLs by mouth 2 (two) times daily.    Dispense:  60 mL    Refill:  0    Appropriate red flag conditions were discussed with the patient as well as actions that should be taken.  Patient expressed his understanding.  Follow-up: Return if symptoms worsen or fail to improve.  Roselee Culver, MD

## 2015-01-25 ENCOUNTER — Ambulatory Visit (INDEPENDENT_AMBULATORY_CARE_PROVIDER_SITE_OTHER): Payer: 59 | Admitting: Family Medicine

## 2015-01-25 ENCOUNTER — Encounter: Payer: Self-pay | Admitting: Family Medicine

## 2015-01-25 VITALS — BP 125/75 | HR 81 | Temp 98.4°F | Resp 16 | Ht 66.0 in | Wt 184.6 lb

## 2015-01-25 DIAGNOSIS — R7303 Prediabetes: Secondary | ICD-10-CM | POA: Diagnosis not present

## 2015-01-25 DIAGNOSIS — E2839 Other primary ovarian failure: Secondary | ICD-10-CM | POA: Diagnosis not present

## 2015-01-25 DIAGNOSIS — Z23 Encounter for immunization: Secondary | ICD-10-CM

## 2015-01-25 DIAGNOSIS — Z0000 Encounter for general adult medical examination without abnormal findings: Secondary | ICD-10-CM

## 2015-01-25 DIAGNOSIS — Z1211 Encounter for screening for malignant neoplasm of colon: Secondary | ICD-10-CM | POA: Diagnosis not present

## 2015-01-25 NOTE — Progress Notes (Signed)
[facility] [address] [place] [address] [phone]  Date:  01/25/2015   Name:  Jo [name]   DOB:  Dec 29, 1945   MRN:  [id number]  PCP:  Kennon Portela, MD    Chief Complaint: Annual Exam   History of Present Illness:  Jo [name] is a 70 y.o. very pleasant female patient who presents with the following:  Here today for a CPE.  States that she was called and told to come in but OW does not really know why she is here today.  She is feeling well and has no other concerns. Admits to some stress from her job but plans to retire soon and is excited about this.   colonosocpy is due this year- will refer mammo is UTD dexa scan: done at [facility] breast center per pt recollection but I called and they have no record of same.  Chart does show a dexa scan from 2012 at [facility] that showed low bone mass.  Will refer for this as well  We last did her labs in October; all looked fine except for pre-diabetes and minimal anemia which is stable  She has not had an abnl pap since she was in her 32s.  She has not noted any vaginal bleeding.    Wt Readings from Last 3 Encounters:  01/25/15 184 lb 9.6 oz (83.734 kg)  01/11/15 182 lb 12.8 oz (82.918 kg)  10/24/14 188 lb (85.276 kg)     Patient Active Problem List   Diagnosis Date Noted  . Pre-diabetes 10/25/2014  . Hypertension   . Hyperlipidemia   . Reflux   . Anemia     Past Medical History  Diagnosis Date  . Hypertension   . Hyperlipidemia   . Reflux   . Anemia   . Menopause age 3  . Goiter   . Spinal stenosis     Past Surgical History  Procedure Laterality Date  . Cervical spine surgery  06/2010    Social History  Substance Use Topics  . Smoking status: Never Smoker   . Smokeless tobacco: None  . Alcohol Use: No     Comment: glass of wine-special occasions    Family History  Problem Relation Age of Onset  . Stroke Mother   . Hypertension Mother   . Heart disease Mother   .  Heart disease Father   . Hypertension    . Cancer      lung x 2  . Hypertension Daughter   . Cancer Sister   . Cancer Brother   . Cancer Sister     Allergies  Allergen Reactions  . Demerol Nausea And Vomiting    VERY SICK    Medication list has been reviewed and updated.  Current Outpatient Prescriptions on File Prior to Visit  Medication Sig Dispense Refill  . amoxicillin (AMOXIL) 500 MG tablet Take 2 tablets (1,000 mg total) by mouth 2 (two) times daily. (Patient not taking: Reported on 03/14/2014) 40 tablet 0  . amoxicillin-clavulanate (AUGMENTIN) 875-125 MG tablet Take 1 tablet by mouth 2 (two) times daily. 20 tablet 0  . aspirin 81 MG tablet Take 81 mg by mouth daily.    Marland Kitchen atorvastatin (LIPITOR) 20 MG tablet Take 1 tablet (20 mg total) by mouth daily. 90 tablet 3  . chlorpheniramine-HYDROcodone (TUSSIONEX PENNKINETIC ER) 10-8 MG/5ML SUER Take 5 mLs by mouth 2 (two) times daily. 60 mL 0  . Cholecalciferol (VITAMIN D3 PO) Take  2,000 Units by mouth daily.     . ferrous fumarate (HEMOCYTE - 106 MG FE) 325 (106 FE) MG TABS tablet Take 1 tablet by mouth.    . fluticasone (FLONASE) 50 MCG/ACT nasal spray Place 2 sprays into both nostrils daily. (Patient not taking: Reported on 03/14/2014) 16 g 2  . lisinopril (PRINIVIL,ZESTRIL) 10 MG tablet Take 1 tablet (10 mg total) by mouth daily. 90 tablet 3  . magnesium 30 MG tablet Take 30 mg by mouth 2 (two) times daily.    . Multiple Vitamin (MULTIVITAMIN) tablet Take 1 tablet by mouth daily.    . pseudoephedrine-guaifenesin (MUCINEX D) 60-600 MG 12 hr tablet Take 1 tablet by mouth every 12 (twelve) hours. 18 tablet 0  . vitamin B-12 (CYANOCOBALAMIN) 1000 MCG tablet Take 1,000 mcg by mouth daily.     No current facility-administered medications on file prior to visit.    Review of Systems:  As per HPI- otherwise negative.   Physical Examination: Filed Vitals:   01/25/15 1447  BP: 125/75  Pulse: 81  Temp: 98.4 F (36.9 C)  Resp: 16    Filed Vitals:   01/25/15 1447  Height: 5\' 6"  (1.676 m)  Weight: 184 lb 9.6 oz (83.734 kg)   Body mass index is 29.81 kg/(m^2). Ideal Body Weight: Weight in (lb) to have BMI = 25: 154.6  GEN: WDWN, NAD, Non-toxic, A & O x 3, overweight, looks well HEENT: Atraumatic, Normocephalic. Neck supple. No masses, No LAD.  Bilateral TM wnl, oropharynx normal.  PEERL,EOMI.   Ears and Nose: No external deformity. CV: RRR, No M/G/R. No JVD. No thrill. No extra heart sounds. PULM: CTA B, no wheezes, crackles, rhonchi. No retractions. No resp. distress. No accessory muscle use. ABD: S, NT, ND. No rebound. No HSM. EXTR: No c/c/e NEURO Normal gait.  PSYCH: Normally interactive. Conversant. Not depressed or anxious appearing.  Calm demeanor.  Breast: normal exam, no masses/ dimpling/ discharge   Assessment and Plan: Physical exam  Special screening for malignant neoplasms, colon - Plan: Ambulatory referral to Gastroenterology  Estrogen deficiency - Plan: DG Bone Density  Immunization due - Plan: Flu Vaccine QUAD 36+ mos IM  Pre-diabetes  Screening referrals as above Gave paper rx for zostavax vaccine Flu shot today Asked her to work on some weight loss and recheck A1c in 4-6 months   Signed Lamar Blinks, MD

## 2015-01-25 NOTE — Patient Instructions (Signed)
It was great to see you as always! You got your flu shot today.  I also gave you an rx for a shingles vaccine- this is a one time shot to help prevent shingles and can be given at most major drug stores at your convenience  We will set up an appt with your gastro doctor for your colonoscopy, and also for a bone density scan  You did have pre-diabetes on your labs in October.  Work on losing a few pounds and we can recheck your A1c test in 4-6 months.    As of next month I will be changing my practice to [facility] [place].  Address: [address] [place], [place] [address] Phone: [phone]  I am glad to see you there or you can certainly see one of my partners here at [facility]

## 2015-09-02 ENCOUNTER — Ambulatory Visit (INDEPENDENT_AMBULATORY_CARE_PROVIDER_SITE_OTHER): Payer: Medicare Other

## 2015-09-02 ENCOUNTER — Ambulatory Visit (INDEPENDENT_AMBULATORY_CARE_PROVIDER_SITE_OTHER): Payer: Medicare Other | Admitting: Physician Assistant

## 2015-09-02 VITALS — BP 126/74 | HR 69 | Temp 98.0°F | Resp 18 | Ht 66.0 in | Wt 188.0 lb

## 2015-09-02 DIAGNOSIS — M25551 Pain in right hip: Secondary | ICD-10-CM | POA: Diagnosis not present

## 2015-09-02 MED ORDER — MELOXICAM 15 MG PO TABS: 7.5000 mg | ORAL_TABLET | Freq: Every day | ORAL | 0 refills | Status: DC

## 2015-09-02 MED ORDER — CYCLOBENZAPRINE HCL 5 MG PO TABS: 5.0000 mg | ORAL_TABLET | Freq: Three times a day (TID) | ORAL | 1 refills | Status: DC | PRN

## 2015-09-02 NOTE — Progress Notes (Signed)
[name], female   DOB: 14-Dec-1945, 70 y.o.   MRN: [id number] [facility] [address] [place] [address] [phone]  Date:  09/02/2015   Name:  Jo [name]   DOB:  [birth year]   MRN:  [id number]  PCP:  Kennon Portela, MD   By signing my name below, I, Ladene Artist, attest that this documentation has been prepared under the direction and in the presence of Ivar Drape, PA-C Electronically Signed: Ladene Artist, ED Scribe 09/02/2015 at 3:45 PM.  History of Present Illness:  Jo [name] is a 70 y.o. female patient who presents to [facility] complaining of right hip pain x1 week. Pt describes pain as a sharp, burning pain that shoots into her right lower extremity with bending, leaning to the right, sitting and lying on her right side. She denies difficulty ambulating, recent falls or trauma. Pt walks daily for 45 minutes but denies walking excessively. No treatments tried PTA. She also denies neck pain, numbness or tingling down the right extremity, swelling, redness, instability. No h/o previous right hip pain.   Pt also presents with a tingling sensation at the tip of the right middle finger for the past week. She denies strenuous activity or repetitive movement. No recent traveling.    Patient Active Problem List   Diagnosis Date Noted   Pre-diabetes 10/25/2014   Hypertension    Hyperlipidemia    Reflux    Anemia     Past Medical History:  Diagnosis Date   Anemia    Goiter    Hyperlipidemia    Hypertension    Menopause age 32   Reflux    Spinal stenosis     Past Surgical History:  Procedure Laterality Date   CERVICAL SPINE SURGERY  06/2010    Social History  Substance Use Topics   Smoking status: Never Smoker   Smokeless tobacco: Not on file   Alcohol use No     Comment: glass of wine-special occasions    Family History  Problem Relation Age of Onset   Stroke Mother     Hypertension Mother    Heart disease Mother    Heart disease Father    Hypertension     Cancer      lung x 2   Hypertension Daughter    Cancer Sister    Cancer Brother    Cancer Sister     Allergies  Allergen Reactions   Demerol Nausea And Vomiting    VERY SICK    Medication list has been reviewed and updated.  Current Outpatient Prescriptions on File Prior to Visit  Medication Sig Dispense Refill   aspirin 81 MG tablet Take 81 mg by mouth daily.     atorvastatin (LIPITOR) 20 MG tablet Take 1 tablet (20 mg total) by mouth daily. 90 tablet 3   lisinopril (PRINIVIL,ZESTRIL) 10 MG tablet Take 1 tablet (10 mg total) by mouth daily. 90 tablet 3   Multiple Vitamin (MULTIVITAMIN) tablet Take 1 tablet by mouth daily.     vitamin B-12 (CYANOCOBALAMIN) 1000 MCG tablet Take 1,000 mcg by mouth daily.     Cholecalciferol (VITAMIN D3 PO) Take 2,000 Units by mouth daily. Reported on 01/25/2015     ferrous fumarate (HEMOCYTE - 106 MG FE) 325 (106 FE) MG TABS tablet Take 1 tablet by mouth.     magnesium 30 MG tablet Take 30 mg by mouth 2 (two) times daily. Reported  on 01/25/2015     No current facility-administered medications on file prior to visit.     Review of Systems  Musculoskeletal: Positive for joint pain. Negative for falls and neck pain.  Neurological: Positive for tingling (R thumb).   Physical Examination: BP 126/74    Pulse 69    Temp 98 F (36.7 C) (Oral)    Resp 18    Ht 5\' 6"  (1.676 m)    Wt 188 lb (85.3 kg)    SpO2 100%    BMI 30.34 kg/m  Ideal Body Weight: @FLOWAMB [id number]  Physical Exam  Constitutional: She is oriented to person, place, and time. She appears well-developed and well-nourished. No distress.  HENT:  Head: Normocephalic and atraumatic.  Right Ear: External ear normal.  Left Ear: External ear normal.  Eyes: Conjunctivae and EOM are normal. Pupils are equal, round, and reactive to light.  Cardiovascular: Normal rate, regular rhythm  and normal pulses.  Exam reveals no gallop and no friction rub.   No murmur heard. Pulmonary/Chest: Effort normal and breath sounds normal. No respiratory distress. She has no wheezes. She has no rhonchi. She has no rales.  Musculoskeletal:  No spinous process tenderness. Tenderness at the lower lumbar sacrum. No tenderness along the SI joint. Tender over the trochanter on the R without erythema or swelling. No warmth to the area. Resisted strengh throughout the LE is normal. Normal passive ROM. Negative straight leg raise. Tenderness with internal rotation. Normal external rotation.   Neurological: She is alert and oriented to person, place, and time.  Skin: She is not diaphoretic.  Psychiatric: She has a normal mood and affect. Her behavior is normal.   Dg Hip Unilat W Or W/o Pelvis 2-3 Views Right  Result Date: 09/02/2015 CLINICAL DATA:  Pain without trauma EXAM: DG HIP (WITH OR WITHOUT PELVIS) 2-3V RIGHT COMPARISON:  None. FINDINGS: Mild degenerative changes with no fracture or dislocation. Enthesopathic changes at the greater trocanter and iliac spine. IMPRESSION: Degenerative changes. Electronically Signed   By: Dorise Bullion III M.D   On: 09/02/2015 16:47    Assessment and Plan: Aleasia Fuge is a 70 y.o. female who is here today for hip pain. Possible trochanter bursitis. I've advised her to use Moban. Advised precautionary guidelines to using this instead. I've advised the advised ice and stretching 3 times per day. She has voiced understanding. She will return to clinic in 7-10 days as needed. Right hip pain - Plan: DG HIP UNILAT W OR W/O PELVIS 2-3 VIEWS RIGHT   Ivar Drape, PA-C [facility] 09/02/2015 3:45 PM

## 2015-09-02 NOTE — Patient Instructions (Addendum)
I would like you to take the mobic.  This is an anti-inflammatory.  Do not take naproxen or ibuprofen with this medication. You can take Tylenol. I'm also giving you Flexeril. You can take 1 tablet to 2 tablets. Like you to try to take 1 tablet. Please note that this is sedated. I would like you to be careful when getting up and walking around your home or anywhere. Do not operate any heavy machinery. Please ice the area 3 times per day for 15 minutes. If the pain is worsening or you're not having any improvement within next 7-10 days, please return. Please perform a stretch 3 times per day.  Trochanteric Bursitis You have hip pain due to trochanteric bursitis. Bursitis means that the sack near the outside of the hip is filled with fluid and inflamed. This sack is made up of protective soft tissue. The pain from trochanteric bursitis can be severe and keep you from sleep. It can radiate to the buttocks or down the outside of the thigh to the knee. The pain is almost always worse when rising from the seated or lying position and with walking. Pain can improve after you take a few steps. It happens more often in people with hip joint and lumbar spine problems, such as arthritis or previous surgery. Very rarely the trochanteric bursa can become infected, and antibiotics and/or surgery may be needed. Treatment often includes an injection of local anesthetic mixed with cortisone medicine. This medicine is injected into the area where it is most tender over the hip. Repeat injections may be necessary if the response to treatment is slow. You can apply ice packs over the tender area for 30 minutes every 2 hours for the next few days. Anti-inflammatory and/or narcotic pain medicine may also be helpful. Limit your activity for the next few days if the pain continues. See your caregiver in 5-10 days if you are not greatly improved.  SEEK IMMEDIATE MEDICAL CARE IF:  You develop severe pain, fever, or increased  redness.  You have pain that radiates below the knee. EXERCISES STRETCHING EXERCISES - Trochanteric Bursitis  These exercises may help you when beginning to rehabilitate your injury. Your symptoms may resolve with or without further involvement from your physician, physical therapist, or athletic trainer. While completing these exercises, remember:   Restoring tissue flexibility helps normal motion to return to the joints. This allows healthier, less painful movement and activity.  An effective stretch should be held for at least 30 seconds.  A stretch should never be painful. You should only feel a gentle lengthening or release in the stretched tissue. STRETCH - Iliotibial Band  On the floor or bed, lie on your side so your injured leg is on top. Bend your knee and grab your ankle.  Slowly bring your knee back so that your thigh is in line with your trunk. Keep your heel at your buttocks and gently arch your back so your head, shoulders and hips line up.  Slowly lower your leg so that your knee approaches the floor/bed until you feel a gentle stretch on the outside of your thigh. If you do not feel a stretch and your knee will not fall farther, place the heel of your opposite foot on top of your knee and pull your thigh down farther.  Hold this stretch for __________ seconds.  Repeat __________ times. Complete this exercise __________ times per day. STRETCH - Hamstrings, Supine   Lie on your back. Loop a belt or  towel over the ball of your foot as shown.  Straighten your knee and slowly pull on the belt to raise your injured leg. Do not allow the knee to bend. Keep your opposite leg flat on the floor.  Raise the leg until you feel a gentle stretch behind your knee or thigh. Hold this position for __________ seconds.  Repeat __________ times. Complete this stretch __________ times per day. STRETCH - Quadriceps, Prone   Lie on your stomach on a firm surface, such as a bed or padded  floor.  Bend your knee and grasp your ankle. If you are unable to reach your ankle or pant leg, use a belt around your foot to lengthen your reach.  Gently pull your heel toward your buttocks. Your knee should not slide out to the side. You should feel a stretch in the front of your thigh and/or knee.  Hold this position for __________ seconds.  Repeat __________ times. Complete this stretch __________ times per day. STRETCHING - Hip Flexors, Lunge Half kneel with your knee on the floor and your opposite knee bent and directly over your ankle.  Keep good posture with your head over your shoulders. Tighten your buttocks to point your tailbone downward; this will prevent your back from arching too much.  You should feel a gentle stretch in the front of your thigh and/or hip. If you do not feel any resistance, slightly slide your opposite foot forward and then slowly lunge forward so your knee once again lines up over your ankle. Be sure your tailbone remains pointed downward.  Hold this stretch for __________ seconds.  Repeat __________ times. Complete this stretch __________ times per day. STRETCH - Adductors, Lunge  While standing, spread your legs.  Lean away from your injured leg by bending your opposite knee. You may rest your hands on your thigh for balance.  You should feel a stretch in your inner thigh. Hold for __________ seconds.  Repeat __________ times. Complete this exercise __________ times per day.   This information is not intended to replace advice given to you by your health care provider. Make sure you discuss any questions you have with your health care provider.   Document Released: 02/08/2004 Document Revised: 05/17/2014 Document Reviewed: 04/14/2008 Elsevier Interactive Patient Education 2016 [employer].    IF you received an x-ray today, you will receive an invoice from [facility]. Please contact [facility] at [phone] with  questions or concerns regarding your invoice.   IF you received labwork today, you will receive an invoice from [employer]. Please contact [facility] at [phone] with questions or concerns regarding your invoice.   Our billing staff will not be able to assist you with questions regarding bills from these companies.  You will be contacted with the lab results as soon as they are available. The fastest way to get your results is to activate your My Chart account. Instructions are located on the last page of this paperwork. If you have not heard from [place] regarding the results in 2 weeks, please contact this office.

## 2015-12-04 ENCOUNTER — Other Ambulatory Visit: Payer: Self-pay | Admitting: Emergency Medicine

## 2015-12-04 ENCOUNTER — Other Ambulatory Visit: Payer: Self-pay | Admitting: Family Medicine

## 2015-12-04 DIAGNOSIS — I10 Essential (primary) hypertension: Secondary | ICD-10-CM

## 2015-12-04 DIAGNOSIS — E785 Hyperlipidemia, unspecified: Secondary | ICD-10-CM

## 2015-12-04 MED ORDER — LISINOPRIL 10 MG PO TABS: 10.0000 mg | ORAL_TABLET | Freq: Every day | ORAL | 0 refills | Status: DC

## 2015-12-04 MED ORDER — ATORVASTATIN CALCIUM 20 MG PO TABS: 20.0000 mg | ORAL_TABLET | Freq: Every day | ORAL | 0 refills | Status: DC

## 2015-12-13 ENCOUNTER — Ambulatory Visit (INDEPENDENT_AMBULATORY_CARE_PROVIDER_SITE_OTHER): Payer: Medicare Other | Admitting: Family Medicine

## 2015-12-13 ENCOUNTER — Encounter: Payer: Self-pay | Admitting: Family Medicine

## 2015-12-13 VITALS — BP 128/70 | HR 72 | Temp 98.4°F | Resp 16 | Ht 66.0 in | Wt 189.6 lb

## 2015-12-13 DIAGNOSIS — Z0000 Encounter for general adult medical examination without abnormal findings: Secondary | ICD-10-CM | POA: Diagnosis not present

## 2015-12-13 DIAGNOSIS — D649 Anemia, unspecified: Secondary | ICD-10-CM | POA: Diagnosis not present

## 2015-12-13 DIAGNOSIS — Z23 Encounter for immunization: Secondary | ICD-10-CM | POA: Diagnosis not present

## 2015-12-13 DIAGNOSIS — M25551 Pain in right hip: Secondary | ICD-10-CM | POA: Diagnosis not present

## 2015-12-13 DIAGNOSIS — R7301 Impaired fasting glucose: Secondary | ICD-10-CM | POA: Diagnosis not present

## 2015-12-13 DIAGNOSIS — E782 Mixed hyperlipidemia: Secondary | ICD-10-CM | POA: Diagnosis not present

## 2015-12-13 LAB — CBC
HCT: 34.4 % — ABNORMAL LOW (ref 35.0–45.0)
Hemoglobin: 11.2 g/dL — ABNORMAL LOW (ref 11.7–15.5)
MCH: 28.4 pg (ref 27.0–33.0)
MCHC: 32.6 g/dL (ref 32.0–36.0)
MCV: 87.1 fL (ref 80.0–100.0)
MPV: 10.3 fL (ref 7.5–12.5)
Platelets: 256 K/uL (ref 140–400)
RBC: 3.95 MIL/uL (ref 3.80–5.10)
RDW: 14.6 % (ref 11.0–15.0)
WBC: 8 K/uL (ref 3.8–10.8)

## 2015-12-13 LAB — COMPREHENSIVE METABOLIC PANEL
ALT: 14 U/L (ref 6–29)
AST: 21 U/L (ref 10–35)
Albumin: 3.8 g/dL (ref 3.6–5.1)
Alkaline Phosphatase: 74 U/L (ref 33–130)
BUN: 16 mg/dL (ref 7–25)
CO2: 29 mmol/L (ref 20–31)
Calcium: 9.4 mg/dL (ref 8.6–10.4)
Chloride: 104 mmol/L (ref 98–110)
Creat: 0.89 mg/dL (ref 0.60–0.93)
Glucose, Bld: 83 mg/dL (ref 65–99)
Potassium: 4.3 mmol/L (ref 3.5–5.3)
Sodium: 139 mmol/L (ref 135–146)
Total Bilirubin: 0.5 mg/dL (ref 0.2–1.2)
Total Protein: 7 g/dL (ref 6.1–8.1)

## 2015-12-13 LAB — LIPID PANEL
Cholesterol: 180 mg/dL
HDL: 72 mg/dL
LDL Cholesterol: 94 mg/dL
Total CHOL/HDL Ratio: 2.5 ratio
Triglycerides: 72 mg/dL
VLDL: 14 mg/dL

## 2015-12-13 LAB — HEMOGLOBIN A1C
Hgb A1c MFr Bld: 5.8 % — ABNORMAL HIGH (ref <5.7)
Mean Plasma Glucose: 120 mg/dL

## 2015-12-13 NOTE — Patient Instructions (Addendum)
IF you received an x-ray today, you will receive an invoice from [facility]. Please contact [facility] at [phone] with questions or concerns regarding your invoice.   IF you received labwork today, you will receive an invoice from [employer]. Please contact [facility] at [phone] with questions or concerns regarding your invoice.   Our billing staff will not be able to assist you with questions regarding bills from these companies.  You will be contacted with the lab results as soon as they are available. The fastest way to get your results is to activate your My Chart account. Instructions are located on the last page of this paperwork. If you have not heard from [place] regarding the results in 2 weeks, please contact this office.      Dyslipidemia Dyslipidemia is an imbalance of waxy, fat-like substances (lipids) in the blood. The body needs lipids in small amounts. Dyslipidemia often involves a high level of cholesterol or triglycerides, which are types of lipids. Common forms of dyslipidemia include:  High levels of bad cholesterol (LDL cholesterol). LDL is the type of cholesterol that causes fatty deposits (plaques) to build up in the blood vessels that carry blood away from your heart (arteries).  Low levels of good cholesterol (HDL cholesterol). HDL cholesterol is the type of cholesterol that protects against heart disease. High levels of HDL remove the LDL buildup from arteries.  High levels of triglycerides. Triglycerides are a fatty substance in the blood that is linked to a buildup of plaques in the arteries. You can develop dyslipidemia because of the genes you are born with (primary dyslipidemia) or changes that occur during your life (secondary dyslipidemia), or as a side effect of certain medical treatments. What are the causes? Primary dyslipidemia is caused by changes (mutations) in genes that are passed down through  families (inherited). These mutations cause several types of dyslipidemia. Mutations can result in disorders that make the body produce too much LDL cholesterol or triglycerides, or not enough HDL cholesterol. These disorders may lead to heart disease, arterial disease, or stroke at an early age. Causes of secondary dyslipidemia include certain lifestyle choices and diseases that lead to dyslipidemia, such as:  Eating a diet that is high in animal fat.  Not getting enough activity or exercise (having a sedentary lifestyle).  Having diabetes, kidney disease, liver disease, or thyroid disease.  Drinking large amounts of alcohol.  Using certain types of drugs. What increases the risk? You may be at greater risk for dyslipidemia if you are an older man or if you are a woman who has gone through menopause. Other risk factors include:  Having a family history of dyslipidemia.  Taking certain medicines, including birth control pills, steroids, some diuretics, beta-blockers, and some medicines forHIV.  Smoking cigarettes.  Eating a high-fat diet.  Drinking large amounts of alcohol.  Having certain medical conditions such as diabetes, polycystic ovary syndrome (PCOS), pregnancy, kidney disease, liver disease, or hypothyroidism.  Not exercising regularly.  Being overweight or obese with too much belly fat. What are the signs or symptoms? Dyslipidemia does not usually cause any symptoms. Very high lipid levels can cause fatty bumps under the skin (xanthomas) or a white or gray ring around the black center (pupil) of the eye. Very high triglyceride levels can cause inflammation of the pancreas (pancreatitis). How is this diagnosed? Your health care provider may diagnose dyslipidemia based on a routine blood test (fasting blood test). Because most people do not  have symptoms of the condition, this blood testing (lipid profile) is done on adults age 52 and older and is repeated every 5 years.  This test checks:  Total cholesterol. This is a measure of the total amount of cholesterol in your blood, including LDL cholesterol, HDL cholesterol, and triglycerides. A healthy number is below 200.  LDL cholesterol. The target number for LDL cholesterol is different for each person, depending on individual risk factors. For most people, a number below 100 is healthy. Ask your health care provider what your LDL cholesterol number should be.  HDL cholesterol. An HDL level of 60 or higher is best because it helps to protect against heart disease. A number below 80 for men or below 8 for women increases the risk for heart disease.  Triglycerides. A healthy triglyceride number is below 150. If your lipid profile is abnormal, your health care provider may do other blood tests to get more information about your condition. How is this treated? Treatment depends on the type of dyslipidemia that you have and your other risk factors for heart disease and stroke. Your health care provider will have a target range for your lipid levels based on this information. For many people, treatment starts with lifestyle changes, such as diet and exercise. Your health care provider may recommend that you:  Get regular exercise.  Make changes to your diet.  Quit smoking if you smoke. If diet changes and exercise do not help you reach your goals, your health care provider may also prescribe medicine to lower lipids. The most commonly prescribed type of medicine lowers your LDL cholesterol (statin drug). If you have a high triglyceride level, your provider may prescribe another type of drug (fibrate) or an omega-3 fish oil supplement, or both. Follow these instructions at home:  Take over-the-counter and prescription medicines only as told by your health care provider. This includes supplements.  Get regular exercise. Start an aerobic exercise and strength training program as told by your health care provider. Ask  your health care provider what activities are safe for you. Your health care provider may recommend:  30 minutes of aerobic activity 4-6 days a week. Brisk walking is an example of aerobic activity.  Strength training 2 days a week.  Eat a healthy diet as told by your health care provider. This can help you reach and maintain a healthy weight, lower your LDL cholesterol, and raise your HDL cholesterol. It may help to work with a diet and nutrition specialist (dietitian) to make a plan that is right for you. Your dietitian or health care provider may recommend:  Limiting your calories, if you are overweight.  Eating more fruits, vegetables, whole grains, fish, and lean meats.  Limiting saturated fat, trans fat, and cholesterol.  Follow instructions from your health care provider or dietitian about eating or drinking restrictions.  Limit alcohol intake to no more than one drink per day for nonpregnant women and two drinks per day for men. One drink equals 12 oz of beer, 5 oz of wine, or 1 oz of hard liquor.  Do not use any products that contain nicotine or tobacco, such as cigarettes and e-cigarettes. If you need help quitting, ask your health care provider.  Keep all follow-up visits as told by your health care provider. This is important. Contact a health care provider if:  You are having trouble sticking to your exercise or diet plan.  You are struggling to quit smoking or control your use of alcohol.  Summary  Dyslipidemia is an imbalance of waxy, fat-like substances (lipids) in the blood. The body needs lipids in small amounts. Dyslipidemia often involves a high level of cholesterol or triglycerides, which are types of lipids.  Treatment depends on the type of dyslipidemia that you have and your other risk factors for heart disease and stroke.  For many people, treatment starts with lifestyle changes, such as diet and exercise. Your health care provider may also prescribe medicine  to lower lipids. This information is not intended to replace advice given to you by your health care provider. Make sure you discuss any questions you have with your health care provider. Document Released: 01/05/2013 Document Revised: 08/28/2015 Document Reviewed: 08/28/2015 [employer] Interactive Patient Education  2017 [employer].

## 2015-12-13 NOTE — Progress Notes (Signed)
Chief Complaint  Patient presents with  . Annual Exam    Subjective:  Jo [name] is a 70 y.o. female here for a health maintenance visit.  Patient is established pt  Right hip pain Pt radiates down to the knee and down to the ankle at times She notices that she can no longer tolerate exercise due to her right hip Pain is rated as 8/10 and  can be sharp and dull. She notices that it is worse at night. She reports that she has to use a cane when the pain is severe. She has to adjust her gait with walking.   DG HIP (WITH OR WITHOUT PELVIS) 2-3V RIGHT COMPARISON:  None.  FINDINGS: Mild degenerative changes with no fracture or dislocation. Enthesopathic changes at the greater trocanter and iliac spine.  IMPRESSION: Degenerative changes.  Electronically Signed   By: Dorise Bullion III M.D   On: 09/02/2015 16:47   Patient Active Problem List   Diagnosis Date Noted  . Pre-diabetes 10/25/2014  . Hypertension   . Hyperlipidemia   . Reflux   . Anemia     Past Medical History:  Diagnosis Date  . Anemia   . Goiter   . Hyperlipidemia   . Hypertension   . Menopause age 71  . Reflux   . Spinal stenosis     Past Surgical History:  Procedure Laterality Date  . CERVICAL SPINE SURGERY  06/2010     Outpatient Medications Prior to Visit  Medication Sig Dispense Refill  . aspirin 81 MG tablet Take 81 mg by mouth daily.    Marland Kitchen atorvastatin (LIPITOR) 20 MG tablet Take 1 tablet (20 mg total) by mouth daily. 90 tablet 0  . Cholecalciferol (VITAMIN D3 PO) Take 2,000 Units by mouth daily. Reported on 01/25/2015    . lisinopril (PRINIVIL,ZESTRIL) 10 MG tablet Take 1 tablet (10 mg total) by mouth daily. 90 tablet 0  . magnesium 30 MG tablet Take 30 mg by mouth 2 (two) times daily. Reported on 01/25/2015    . Multiple Vitamin (MULTIVITAMIN) tablet Take 1 tablet by mouth daily.    . vitamin B-12 (CYANOCOBALAMIN) 1000 MCG tablet Take 1,000 mcg by mouth daily.    .  cyclobenzaprine (FLEXERIL) 5 MG tablet Take 1-2 tablets (5-10 mg total) by mouth 3 (three) times daily as needed for muscle spasms. (Patient not taking: Reported on 12/13/2015) 30 tablet 1  . ferrous fumarate (HEMOCYTE - 106 MG FE) 325 (106 FE) MG TABS tablet Take 1 tablet by mouth.    . meloxicam (MOBIC) 15 MG tablet Take 0.5-1 tablets (7.5-15 mg total) by mouth daily. (Patient not taking: Reported on 12/13/2015) 30 tablet 0   No facility-administered medications prior to visit.     Allergies  Allergen Reactions  . Demerol Nausea And Vomiting    VERY SICK     Family History  Problem Relation Age of Onset  . Stroke Mother   . Hypertension Mother   . Heart disease Mother   . Heart disease Father   . Hypertension Daughter   . Cancer Sister   . Cancer Brother   . Cancer Sister   . Hypertension    . Cancer      lung x 2     Health Habits: Dental Exam: up to date Eye Exam: up to date Exercise: 0 times/week on average Current exercise activities: walking but limited by right hip pain Diet: DASH diet  Social History   Social History  .  Marital status: Widowed    Spouse name: N/A  . Number of children: 3  . Years of education: college   Occupational History  . [place] History Main Topics  . Smoking status: Never Smoker  . Smokeless tobacco: Never Used  . Alcohol use No     Comment: glass of wine-special occasions  . Drug use: No  . Sexual activity: No   Other Topics Concern  . Not on file   Social History Narrative  . No narrative on file   History  Alcohol Use No    Comment: glass of wine-special occasions   History  Smoking Status  . Never Smoker  Smokeless Tobacco  . Never Used   History  Drug Use No    GYN: Sexual Health LMP: No LMP recorded. Patient is postmenopausal. Last pap smear: see HM section History of abnormal pap smears:  Sexually active: no Current contraception: n/a  Health Maintenance: See under health  Maintenance activity for review of completion dates as well. Immunization History  Administered Date(s) Administered  . Influenza Whole 01/14/2009  . Influenza,inj,Quad PF,36+ Mos 11/02/2012, 11/22/2013, 01/25/2015, 12/13/2015  . Pneumococcal Conjugate-13 11/22/2013  . Pneumococcal Polysaccharide-23 04/14/2009  . Tdap 04/14/2009      Depression Screen-PHQ2/9 Depression screen [facility] 2/9 12/13/2015 09/02/2015 01/25/2015 01/11/2015 10/24/2014  Decreased Interest 0 0 0 0 0  Down, Depressed, Hopeless 0 0 0 0 0  PHQ - 2 Score 0 0 0 0 0      Depression Severity and Treatment Recommendations:  0-4= None  5-9= Mild / Treatment: Support, educate to call if worse; return in one month  10-14= Moderate / Treatment: Support, watchful waiting; Antidepressant or Psycotherapy  15-19= Moderately severe / Treatment: Antidepressant OR Psychotherapy  >= 20 = Major depression, severe / Antidepressant AND Psychotherapy    Review of Systems   Review of Systems  Constitutional: Negative for chills, fever and weight loss.  HENT: Negative for ear pain, hearing loss and tinnitus.   Eyes: Negative for blurred vision, double vision and photophobia.  Cardiovascular: Negative for chest pain, palpitations and orthopnea.  Gastrointestinal: Negative for abdominal pain, nausea and vomiting.  Genitourinary: Negative for dysuria and urgency.  Musculoskeletal: Positive for joint pain. Negative for myalgias and neck pain.  Skin: Negative for itching and rash.  Neurological: Negative for tingling and headaches.  Psychiatric/Behavioral: Negative for depression. The patient is not nervous/anxious and does not have insomnia.     See HPI for ROS as well.    Objective:   Vitals:   12/13/15 1037  BP: 128/70  Pulse: 72  Resp: 16  Temp: 98.4 F (36.9 C)  TempSrc: Oral  SpO2: 98%  Weight: 189 lb 9.6 oz (86 kg)  Height: 5\' 6"  (1.676 m)    Body mass index is 30.6 kg/m.  Physical Exam  Constitutional: She  is oriented to person, place, and time. She appears well-developed and well-nourished.  HENT:  Head: Normocephalic and atraumatic.  Right Ear: External ear normal.  Left Ear: External ear normal.  Nose: Nose normal.  Mouth/Throat: Oropharynx is clear and moist.  Eyes: Conjunctivae and EOM are normal. Pupils are equal, round, and reactive to light.  Neck: Normal range of motion. Neck supple. No thyromegaly present.  Cardiovascular: Normal rate, regular rhythm, normal heart sounds and intact distal pulses.   Pulmonary/Chest: Effort normal and breath sounds normal. No respiratory distress. She has no wheezes. She has no rales. Right breast exhibits no inverted  nipple, no mass, no nipple discharge, no skin change and no tenderness. Left breast exhibits no inverted nipple, no mass, no nipple discharge, no skin change and no tenderness. Breasts are symmetrical.  Abdominal: Soft. Bowel sounds are normal. She exhibits no distension and no mass. There is no tenderness. There is no rebound and no guarding.  Musculoskeletal: Normal range of motion. She exhibits no edema.       Right hip: She exhibits tenderness. She exhibits normal range of motion, normal strength, no bony tenderness, no swelling, no crepitus, no deformity and no laceration.       Left hip: Normal. She exhibits normal range of motion, normal strength, no tenderness and no bony tenderness.  Neurological: She is alert and oriented to person, place, and time. No cranial nerve deficit.  Skin: Skin is warm. No rash noted. No erythema.  Psychiatric: She has a normal mood and affect. Her behavior is normal. Judgment and thought content normal.  Vitals reviewed.      Assessment/Plan:   Patient was seen for a health maintenance exam.  Counseled the patient on health maintenance issues. Reviewed her health mainteance schedule and ordered appropriate tests (see orders.) Counseled on regular exercise and weight management. Recommend regular eye  exams and dental cleaning.   The following issues were addressed today for health maintenance:   Lianny was seen today for annual exam.  Diagnoses and all orders for this visit:  Encounter for Medicare annual wellness exam- age appropriate screenings reviewed -     CBC -     Hemoglobin A1c -     Lipid panel -     Comprehensive metabolic panel  Right hip pain- discussed that since she has some hip pain will refer to PT -     Ambulatory referral to Physical Therapy -     CBC  Need for prophylactic vaccination and inoculation against influenza -     Flu Vaccine QUAD 36+ mos PF IM (Fluarix & Fluzone Quad PF)  Mixed hyperlipidemia- last lipid panel showed good control on statin -     Lipid panel -     Comprehensive metabolic panel  Mild anemia- discussed goals for hemoglobin If hemoglobin is substantially lower will do additional work up -     CBC  Impaired fasting glucose- last a1c 6.2 Pt adhering to a good diet -     Hemoglobin A1c    No Follow-up on file.    Body mass index is 30.6 kg/m.:  Discussed the patient's BMI with patient. The BMI body mass index is 30.6 kg/m.     No future appointments.  Patient Instructions       IF you received an x-ray today, you will receive an invoice from [facility]. Please contact [facility] at [phone] with questions or concerns regarding your invoice.   IF you received labwork today, you will receive an invoice from [employer]. Please contact [facility] at [phone] with questions or concerns regarding your invoice.   Our billing staff will not be able to assist you with questions regarding bills from these companies.  You will be contacted with the lab results as soon as they are available. The fastest way to get your results is to activate your My Chart account. Instructions are located on the last page of this paperwork. If you have not heard from [place] regarding the  results in 2 weeks, please contact this office.      Dyslipidemia Dyslipidemia is an  imbalance of waxy, fat-like substances (lipids) in the blood. The body needs lipids in small amounts. Dyslipidemia often involves a high level of cholesterol or triglycerides, which are types of lipids. Common forms of dyslipidemia include:  High levels of bad cholesterol (LDL cholesterol). LDL is the type of cholesterol that causes fatty deposits (plaques) to build up in the blood vessels that carry blood away from your heart (arteries).  Low levels of good cholesterol (HDL cholesterol). HDL cholesterol is the type of cholesterol that protects against heart disease. High levels of HDL remove the LDL buildup from arteries.  High levels of triglycerides. Triglycerides are a fatty substance in the blood that is linked to a buildup of plaques in the arteries. You can develop dyslipidemia because of the genes you are born with (primary dyslipidemia) or changes that occur during your life (secondary dyslipidemia), or as a side effect of certain medical treatments. What are the causes? Primary dyslipidemia is caused by changes (mutations) in genes that are passed down through families (inherited). These mutations cause several types of dyslipidemia. Mutations can result in disorders that make the body produce too much LDL cholesterol or triglycerides, or not enough HDL cholesterol. These disorders may lead to heart disease, arterial disease, or stroke at an early age. Causes of secondary dyslipidemia include certain lifestyle choices and diseases that lead to dyslipidemia, such as:  Eating a diet that is high in animal fat.  Not getting enough activity or exercise (having a sedentary lifestyle).  Having diabetes, kidney disease, liver disease, or thyroid disease.  Drinking large amounts of alcohol.  Using certain types of drugs. What increases the risk? You may be at greater risk for dyslipidemia if you are an  older man or if you are a woman who has gone through menopause. Other risk factors include:  Having a family history of dyslipidemia.  Taking certain medicines, including birth control pills, steroids, some diuretics, beta-blockers, and some medicines forHIV.  Smoking cigarettes.  Eating a high-fat diet.  Drinking large amounts of alcohol.  Having certain medical conditions such as diabetes, polycystic ovary syndrome (PCOS), pregnancy, kidney disease, liver disease, or hypothyroidism.  Not exercising regularly.  Being overweight or obese with too much belly fat. What are the signs or symptoms? Dyslipidemia does not usually cause any symptoms. Very high lipid levels can cause fatty bumps under the skin (xanthomas) or a white or gray ring around the black center (pupil) of the eye. Very high triglyceride levels can cause inflammation of the pancreas (pancreatitis). How is this diagnosed? Your health care provider may diagnose dyslipidemia based on a routine blood test (fasting blood test). Because most people do not have symptoms of the condition, this blood testing (lipid profile) is done on adults age 42 and older and is repeated every 5 years. This test checks:  Total cholesterol. This is a measure of the total amount of cholesterol in your blood, including LDL cholesterol, HDL cholesterol, and triglycerides. A healthy number is below 200.  LDL cholesterol. The target number for LDL cholesterol is different for each person, depending on individual risk factors. For most people, a number below 100 is healthy. Ask your health care provider what your LDL cholesterol number should be.  HDL cholesterol. An HDL level of 60 or higher is best because it helps to protect against heart disease. A number below 51 for men or below 5 for women increases the risk for heart disease.  Triglycerides. A healthy triglyceride number is below 150. If  your lipid profile is abnormal, your health care  provider may do other blood tests to get more information about your condition. How is this treated? Treatment depends on the type of dyslipidemia that you have and your other risk factors for heart disease and stroke. Your health care provider will have a target range for your lipid levels based on this information. For many people, treatment starts with lifestyle changes, such as diet and exercise. Your health care provider may recommend that you:  Get regular exercise.  Make changes to your diet.  Quit smoking if you smoke. If diet changes and exercise do not help you reach your goals, your health care provider may also prescribe medicine to lower lipids. The most commonly prescribed type of medicine lowers your LDL cholesterol (statin drug). If you have a high triglyceride level, your provider may prescribe another type of drug (fibrate) or an omega-3 fish oil supplement, or both. Follow these instructions at home:  Take over-the-counter and prescription medicines only as told by your health care provider. This includes supplements.  Get regular exercise. Start an aerobic exercise and strength training program as told by your health care provider. Ask your health care provider what activities are safe for you. Your health care provider may recommend:  30 minutes of aerobic activity 4-6 days a week. Brisk walking is an example of aerobic activity.  Strength training 2 days a week.  Eat a healthy diet as told by your health care provider. This can help you reach and maintain a healthy weight, lower your LDL cholesterol, and raise your HDL cholesterol. It may help to work with a diet and nutrition specialist (dietitian) to make a plan that is right for you. Your dietitian or health care provider may recommend:  Limiting your calories, if you are overweight.  Eating more fruits, vegetables, whole grains, fish, and lean meats.  Limiting saturated fat, trans fat, and cholesterol.  Follow  instructions from your health care provider or dietitian about eating or drinking restrictions.  Limit alcohol intake to no more than one drink per day for nonpregnant women and two drinks per day for men. One drink equals 12 oz of beer, 5 oz of wine, or 1 oz of hard liquor.  Do not use any products that contain nicotine or tobacco, such as cigarettes and e-cigarettes. If you need help quitting, ask your health care provider.  Keep all follow-up visits as told by your health care provider. This is important. Contact a health care provider if:  You are having trouble sticking to your exercise or diet plan.  You are struggling to quit smoking or control your use of alcohol. Summary  Dyslipidemia is an imbalance of waxy, fat-like substances (lipids) in the blood. The body needs lipids in small amounts. Dyslipidemia often involves a high level of cholesterol or triglycerides, which are types of lipids.  Treatment depends on the type of dyslipidemia that you have and your other risk factors for heart disease and stroke.  For many people, treatment starts with lifestyle changes, such as diet and exercise. Your health care provider may also prescribe medicine to lower lipids. This information is not intended to replace advice given to you by your health care provider. Make sure you discuss any questions you have with your health care provider. Document Released: 01/05/2013 Document Revised: 08/28/2015 Document Reviewed: 08/28/2015 Elsevier Interactive Patient Education  2017 [employer].

## 2015-12-25 ENCOUNTER — Ambulatory Visit: Payer: Medicare Other | Attending: Family Medicine | Admitting: Physical Therapy

## 2015-12-25 DIAGNOSIS — R262 Difficulty in walking, not elsewhere classified: Secondary | ICD-10-CM | POA: Insufficient documentation

## 2015-12-25 DIAGNOSIS — M25651 Stiffness of right hip, not elsewhere classified: Secondary | ICD-10-CM | POA: Insufficient documentation

## 2015-12-25 DIAGNOSIS — M6281 Muscle weakness (generalized): Secondary | ICD-10-CM | POA: Diagnosis present

## 2015-12-25 DIAGNOSIS — M25551 Pain in right hip: Secondary | ICD-10-CM | POA: Diagnosis not present

## 2015-12-25 NOTE — Patient Instructions (Signed)
Stretching: Hamstring (Supine)    Supporting right thigh behind knee, slowly straighten knee until stretch is felt in back of thigh. Hold _30___ seconds. Use a strap or a sheet around the arch of your foot.  Repeat __3__ times per set. Do _1___ sets per session. Do _2___ sessions per day.  http://orth.exer.us/657   Copyright  VHI. All rights reserved.    Lower Trunk Rotation Stretch    Keeping back flat and feet together, rotate knees to left side. Hold ____ seconds. Repeat ____ times per set. Do ____ sets per session. Do ____ sessions per day.  http://orth.exer.us/123   Copyright  VHI. All rights reserved.    PELVIC STABILIZATION: Basic Bridge    Exhaling, lift hips. Hold for _2__ breaths. Exhaling, release hips back to floor. Repeat _10__ times. Do __2_ times per day.  Copyright  VHI. All rights reserved.
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
## 2016-01-02 ENCOUNTER — Ambulatory Visit: Payer: Medicare Other | Admitting: Physical Therapy

## 2016-01-02 DIAGNOSIS — M25551 Pain in right hip: Secondary | ICD-10-CM | POA: Diagnosis not present

## 2016-01-02 DIAGNOSIS — R262 Difficulty in walking, not elsewhere classified: Secondary | ICD-10-CM

## 2016-01-02 DIAGNOSIS — M6281 Muscle weakness (generalized): Secondary | ICD-10-CM

## 2016-01-02 DIAGNOSIS — M25651 Stiffness of right hip, not elsewhere classified: Secondary | ICD-10-CM

## 2016-01-02 NOTE — Patient Instructions (Signed)
Sleeping on Back  Place pillow under knees. A pillow with cervical support and a roll around waist are also helpful. Copyright  VHI. All rights reserved.  Sleeping on Side Place pillow between knees. Use cervical support under neck and a roll around waist as needed. Copyright  VHI. All rights reserved.   Sleeping on Stomach   If this is the only desirable sleeping position, place pillow under lower legs, and under stomach or chest as needed.  Posture - Sitting   Sit upright, head facing forward. Try using a roll to support lower back. Keep shoulders relaxed, and avoid rounded back. Keep hips level with knees. Avoid crossing legs for long periods. Stand to Sit / Sit to Stand   To sit: Bend knees to lower self onto front edge of chair, then scoot back on seat. To stand: Reverse sequence by placing one foot forward, and scoot to front of seat. Use rocking motion to stand up.   Work Height and Reach  Ideal work height is no more than 2 to 4 inches below elbow level when standing, and at elbow level when sitting. Reaching should be limited to arm's length, with elbows slightly bent.  Bending  Bend at hips and knees, not back. Keep feet shoulder-width apart.    Posture - Standing   Good posture is important. Avoid slouching and forward head thrust. Maintain curve in low back and align ears over shoul- ders, hips over ankles.  Alternating Positions   Alternate tasks and change positions frequently to reduce fatigue and muscle tension. Take rest breaks. Computer Work   Position work to face forward. Use proper work and seat height. Keep shoulders back and down, wrists straight, and elbows at right angles. Use chair that provides full back support. Add footrest and lumbar roll as needed.  Getting Into / Out of Car  Lower self onto seat, scoot back, then bring in one leg at a time. Reverse sequence to get out.  Dressing  Lie on back to pull socks or slacks over feet, or sit  and bend leg while keeping back straight.    Housework - Sink  Place one foot on ledge of cabinet under sink when standing at sink for prolonged periods.   Pushing / Pulling  Pushing is preferable to pulling. Keep back in proper alignment, and use leg muscles to do the work.  Deep Squat   Squat and lift with both arms held against upper trunk. Tighten stomach muscles without holding breath. Use smooth movements to avoid jerking.  Avoid Twisting   Avoid twisting or bending back. Pivot around using foot movements, and bend at knees if needed when reaching for articles.  Carrying Luggage   Distribute weight evenly on both sides. Use a cart whenever possible. Do not twist trunk. Move body as a unit.   Lifting Principles .Maintain proper posture and head alignment. .Slide object as close as possible before lifting. .Move obstacles out of the way. .Test before lifting; ask for help if too heavy. .Tighten stomach muscles without holding breath. .Use smooth movements; do not jerk. .Use legs to do the work, and pivot with feet. .Distribute the work load symmetrically and close to the center of trunk. .Push instead of pull whenever possible.   Ask For Help   Ask for help and delegate to others when possible. Coordinate your movements when lifting together, and maintain the low back curve.  Log Roll   Lying on back, bend left knee and place left   arm across chest. Roll all in one movement to the right. Reverse to roll to the left. Always move as one unit. Housework - Sweeping  Use long-handled equipment to avoid stooping.   Housework - Wiping  Position yourself as close as possible to reach work surface. Avoid straining your back.  Laundry - Unloading Wash   To unload small items at bottom of washer, lift leg opposite to arm being used to reach.  [place] close to area to be raked. Use arm movements to do the work. Keep back straight and avoid  twisting.     Cart  When reaching into cart with one arm, lift opposite leg to keep back straight.   Getting Into / Out of Bed  Lower self to lie down on one side by raising legs and lowering head at the same time. Use arms to assist moving without twisting. Bend both knees to roll onto back if desired. To sit up, start from lying on side, and use same move-ments in reverse. Housework - Vacuuming  Hold the vacuum with arm held at side. Step back and forth to move it, keeping head up. Avoid twisting.   Laundry - IT consultant so that bending and twisting can be avoided.   Laundry - Unloading Dryer  Squat down to reach into clothes dryer or use a reacher.  Gardening - Weeding / Probation officer or Kneel. Knee pads may be helpful.                  Achilles Tendon Stretch    Stand with hands supported on wall, elbows slightly bent, feet parallel and both heels on floor, front knee bent, back knee straight. Slowly relax back knee until a stretch is felt in achilles tendon. Hold __30__ seconds. Repeat with leg positions switched.  3 X daily  Copyright  VHI. All rights reserved.

## 2016-01-02 NOTE — Therapy (Signed)
[place], [place], [address] Phone: [phone]   Fax:  [phone]  Physical Therapy Treatment  Patient Details  Name: Jo [name] MRN: [id number] Date of Birth: 1945/04/09 Referring Provider: Dr. Delia Chimes  Encounter Date: 01/02/2016      PT End of Session - 01/02/16 0929    Visit Number 2   Number of Visits 16   PT Start Time 0846   PT Stop Time 0929   PT Time Calculation (min) 43 min   Activity Tolerance Patient tolerated treatment well   Behavior During Therapy [facility] for tasks assessed/performed      Past Medical History:  Diagnosis Date  . Anemia   . Goiter   . Hyperlipidemia   . Hypertension   . Menopause age 70  . Reflux   . Spinal stenosis     Past Surgical History:  Procedure Laterality Date  . CERVICAL SPINE SURGERY  06/2010    There were no vitals filed for this visit.      Subjective Assessment - 01/02/16 0848    Subjective It is feeling better.  No pain right now.  Able to sleep throught the last few nights better.  Exercises have helped.   Currently in Pain? No/denies   Pain Location Back   Pain Orientation Right   Pain Descriptors / Indicators Burning   Pain Type Chronic pain   Pain Radiating Towards to knee   Aggravating Factors  walking extended period.  Avoids sleeping on the hip   Pain Relieving Factors positioning a certain way in bed helps, exercises   Effect of Pain on Daily Activities Limits walking,  walks with a slower pace.                         [facility] Adult PT Treatment/Exercise - 01/02/16 0001      Self-Care   Self-Care --  ADL demonstrations, sitting posture , vacume, etc     Lumbar Exercises: Stretches   Active Hamstring Stretch 3 reps;30 seconds  Good ROM,  cues for breathing.     Lower Trunk Rotation 5 reps  10 seconds hold   Pelvic Tilt --  10 X 5 seconds, cues supine, standing X2     Knee/Hip Exercises:  Stretches   Gastroc Stretch 3 reps;30 seconds  at wall and on step, HEP   Other Knee/Hip Stretches inner thigh stretch 2 X 10 seconds     Knee/Hip Exercises: Supine   Bridges 10 reps   Bridges Limitations cues to breath  She exercises on the floor at home                PT Education - 01/02/16 [phone]    Education provided Yes   Education Details ADL, HEP   Person(s) Educated Patient   Methods Explanation;Demonstration;Verbal cues;Handout   Comprehension Verbalized understanding;Returned demonstration;Need further instruction          PT Short Term Goals - 01/02/16 0856      PT SHORT TERM GOAL #1   Title Pt will be I with initial HEP   Baseline independents with current exercises   Time 4   Period Weeks   Status On-going     PT SHORT TERM GOAL #2   Title Pt will be able to report less pain in hip and lower leg, improved by 20-25%   Baseline improved 80%   Time 4   Period Weeks  Status Achieved           PT Long Term Goals - 12/25/15 1315      PT LONG TERM GOAL #1   Title Pt will be I wiht more advanced HEP    Time 8   Period Weeks   Status New     PT LONG TERM GOAL #2   Title Pt will be I with concepts of posture and body mechanics , RICE    Time 8   Period Weeks   Status New     PT LONG TERM GOAL #3   Title Pt will be able to walk for up to 30 min for fitness and report min increase in hip pain which resolves with rest and RICE   Time 8   Period Weeks   Status New     PT LONG TERM GOAL #4   Title Pt will be able to stand, walk and engage in recreation without limitation of pain    Time 8   Period Weeks   Status New     PT LONG TERM GOAL #5   Title Pt will demo 4+/5 strength in Hips for improved gait mechanics and stability.    Time 8   Period Weeks   Status New               Plan - 01/02/16 0930    Clinical Impression Statement Progress toward HEP goals.  Pain improved 80%.  STG#2 met. ADL education initiated.   PT Next  Visit Plan check HEP Modalities as needed AROM Rt hip and strength hip abd, ext ADL questions,    PT Home Exercise Plan LTR, hamstring stretch and bridge calf stretch   Consulted and Agree with Plan of Care Patient      Patient will benefit from skilled therapeutic intervention in order to improve the following deficits and impairments:  Decreased balance, Decreased mobility, Decreased strength, Impaired flexibility, Pain, Obesity, Increased fascial restricitons, Difficulty walking, Decreased range of motion  Visit Diagnosis: Pain in right hip  Difficulty in walking, not elsewhere classified  Stiffness of right hip, not elsewhere classified  Muscle weakness (generalized)     Problem List Patient Active Problem List   Diagnosis Date Noted  . Pre-diabetes 10/25/2014  . Hypertension   . Hyperlipidemia   . Reflux   . Anemia     Chapel Silverthorn PTA 01/02/2016, 9:32 AM  [facility] [address] [place], [place], [address] Phone: [phone]   Fax:  [phone]  Name: Jo [name] MRN: [id number] Date of Birth: March 01, 1945

## 2016-01-05 ENCOUNTER — Ambulatory Visit: Payer: Medicare Other | Admitting: Physical Therapy

## 2016-01-05 ENCOUNTER — Telehealth: Payer: Self-pay | Admitting: Physical Therapy

## 2016-01-05 NOTE — Telephone Encounter (Signed)
Left voicemail for patient regarding no-show appointment today. Asked her to call to cancel her next appointment if she does not plan to attend.

## 2016-01-10 ENCOUNTER — Ambulatory Visit: Payer: Medicare Other | Admitting: Physical Therapy

## 2016-01-10 DIAGNOSIS — M25551 Pain in right hip: Secondary | ICD-10-CM

## 2016-01-10 DIAGNOSIS — R262 Difficulty in walking, not elsewhere classified: Secondary | ICD-10-CM

## 2016-01-10 DIAGNOSIS — M25651 Stiffness of right hip, not elsewhere classified: Secondary | ICD-10-CM

## 2016-01-10 DIAGNOSIS — M6281 Muscle weakness (generalized): Secondary | ICD-10-CM

## 2016-01-10 NOTE — Patient Instructions (Signed)

## 2016-01-10 NOTE — Therapy (Signed)
[place], [place], [address] Phone: [phone]   Fax:  [phone]  Physical Therapy Treatment  Patient Details  Name: Jo [name] MRN: [id number] Date of Birth: Dec 01, 1945 Referring Provider: Dr. Delia Chimes  Encounter Date: 01/10/2016      PT End of Session - 01/10/16 0901    Visit Number 3   Number of Visits 16   PT Start Time 0076   PT Stop Time 0938   PT Time Calculation (min) 51 min   Activity Tolerance Patient tolerated treatment well   Behavior During Therapy [facility] [place] [facility] for tasks assessed/performed      Past Medical History:  Diagnosis Date  . Anemia   . Goiter   . Hyperlipidemia   . Hypertension   . Menopause age 70  . Reflux   . Spinal stenosis     Past Surgical History:  Procedure Laterality Date  . CERVICAL SPINE SURGERY  06/2010    There were no vitals filed for this visit.      Subjective Assessment - 01/10/16 0858    Subjective Continues to do well, just has been busy, missed an appt as she had to go take care of her sick brother. Being mindful of body mechanics with housework.    Currently in Pain? Yes   Pain Score 1    Pain Location Back   Pain Orientation Lower   Pain Descriptors / Indicators Discomfort   Pain Type Chronic pain   Pain Onset More than a month ago   Pain Frequency Intermittent                [facility] Adult PT Treatment/Exercise - 01/10/16 0907      Lumbar Exercises: Stretches   Active Hamstring Stretch 2 reps;30 seconds   Lower Trunk Rotation 5 reps   Pelvic Tilt 10 seconds   Pelvic Tilt Limitations x 10 post tilt      Lumbar Exercises: Supine   Clam 10 reps   Clam Limitations 1 set with blue band and 1 set without    Bent Knee Raise 10 reps     Lumbar Exercises: Sidelying   Clam 20 reps     Knee/Hip Exercises: Supine   Bridges --   Bridges with [employer] Strengthening;Both;1 set;10 reps     Moist Heat Therapy   Number  Minutes Moist Heat 15 Minutes   Moist Heat Location Lumbar Spine;Hip                PT Education - 01/10/16 0930    Education provided Yes   Education Details stabilization, core and hip strength, clam    Person(s) Educated Patient   Methods Explanation;Demonstration   Comprehension Verbalized understanding;Returned demonstration          PT Short Term Goals - 01/10/16 0915      PT SHORT TERM GOAL #1   Title Pt will be I with initial HEP   Status Achieved     PT SHORT TERM GOAL #2   Title Pt will be able to report less pain in hip and lower leg, improved by 20-25%   Status Achieved           PT Long Term Goals - 01/10/16 0915      PT LONG TERM GOAL #1   Title Pt will be I with more advanced HEP    Status On-going     PT LONG TERM GOAL #2   Title Pt  will be I with concepts of posture and body mechanics , RICE    Status On-going     PT LONG TERM GOAL #3   Title Pt will be able to walk for up to 30 min for fitness and report min increase in hip pain which resolves with rest and RICE   Status On-going     PT LONG TERM GOAL #4   Title Pt will be able to stand, walk and engage in recreation without limitation of pain    Status On-going     PT LONG TERM GOAL #5   Title Pt will demo 4+/5 strength in Hips for improved gait mechanics and stability.    Status On-going               Plan - 01/10/16 0935    Clinical Impression Statement Patient cont to improve, introduced stabilization exercises today.     PT Next Visit Plan check HEP for stab, progress hip abd and ext strength, AROM Rt hip and strength hip abd, ext    PT Home Exercise Plan LTR, hamstring stretch and bridge calf stretch, prepilates (not heel slide)   Consulted and Agree with Plan of Care Patient      Patient will benefit from skilled therapeutic intervention in order to improve the following deficits and impairments:  Decreased balance, Decreased mobility, Decreased strength, Impaired  flexibility, Pain, Obesity, Increased fascial restricitons, Difficulty walking, Decreased range of motion  Visit Diagnosis: Pain in right hip  Difficulty in walking, not elsewhere classified  Stiffness of right hip, not elsewhere classified  Muscle weakness (generalized)     Problem List Patient Active Problem List   Diagnosis Date Noted  . Pre-diabetes 10/25/2014  . Hypertension   . Hyperlipidemia   . Reflux   . Anemia     Albertine Lafoy 01/10/2016, 9:37 AM  [facility] [address] [place], [place], [address] Phone: [phone]   Fax:  [phone]  Name: Eunice Winecoff MRN: [id number] Date of Birth: [birth year]   Raeford Razor, PT 01/10/16 9:38 AM Phone: [phone] Fax: [phone]

## 2016-01-12 ENCOUNTER — Ambulatory Visit: Payer: Medicare Other | Admitting: Physical Therapy

## 2016-01-12 DIAGNOSIS — R262 Difficulty in walking, not elsewhere classified: Secondary | ICD-10-CM

## 2016-01-12 DIAGNOSIS — M25651 Stiffness of right hip, not elsewhere classified: Secondary | ICD-10-CM

## 2016-01-12 DIAGNOSIS — M6281 Muscle weakness (generalized): Secondary | ICD-10-CM

## 2016-01-12 DIAGNOSIS — M25551 Pain in right hip: Secondary | ICD-10-CM

## 2016-01-12 NOTE — Therapy (Signed)
[facility] [place], [place], [address] Phone: [phone]   Fax:  [phone]  Physical Therapy Treatment  Patient Details  Name: Jo [name] MRN: [id number] Date of Birth: 26-Dec-1945 Referring Provider: Dr. Delia Chimes  Encounter Date: 01/12/2016      PT End of Session - 01/12/16 0858    Visit Number 4   Number of Visits 16   PT Start Time 0846   PT Stop Time 0945   PT Time Calculation (min) 59 min   Activity Tolerance Patient tolerated treatment well   Behavior During Therapy [facility] for tasks assessed/performed      Past Medical History:  Diagnosis Date  . Anemia   . Goiter   . Hyperlipidemia   . Hypertension   . Menopause age 60  . Reflux   . Spinal stenosis     Past Surgical History:  Procedure Laterality Date  . CERVICAL SPINE SURGERY  06/2010    There were no vitals filed for this visit.      Subjective Assessment - 01/12/16 0857    Subjective No pain today.  Was able to walk a little bit the other day.    Currently in Pain? No/denies            [facility] Adult PT Treatment/Exercise - 01/12/16 0859      Lumbar Exercises: Stretches   Active Hamstring Stretch 2 reps;30 seconds   Active Hamstring Stretch Limitations 3 way hip stretching add, abd and hamstring      Lumbar Exercises: Standing   Wall Slides 15 reps   Wall Slides Limitations with ball    Other Standing Lumbar Exercises standing hip abduction x 15 each side at countertop      Lumbar Exercises: Supine   Bridge 10 reps   Other Supine Lumbar Exercises Large ball exercises: hamstring curl x 10, bridge, SLR from ball x 10 each and lower trunk rotation/twist      Knee/Hip Exercises: Stretches   Piriformis Stretch Both;2 reps;30 seconds     Knee/Hip Exercises: Supine   Bridges Strengthening;Both;1 set;2 sets;10 reps   Bridges Limitations 1 set with blue band clam (other set iso press out with band)      Moist Heat  Therapy   Number Minutes Moist Heat 15 Minutes   Moist Heat Location Lumbar Spine;Hip                PT Education - 01/12/16 0904    Education provided Yes   Education Details simple ball exercises , variations for stability    Person(s) Educated Patient   Methods Explanation;Demonstration   Comprehension Verbalized understanding;Returned demonstration          PT Short Term Goals - 01/10/16 0915      PT SHORT TERM GOAL #1   Title Pt will be I with initial HEP   Status Achieved     PT SHORT TERM GOAL #2   Title Pt will be able to report less pain in hip and lower leg, improved by 20-25%   Status Achieved           PT Long Term Goals - 01/10/16 0915      PT LONG TERM GOAL #1   Title Pt will be I with more advanced HEP    Status On-going     PT LONG TERM GOAL #2   Title Pt will be I with concepts of posture and body mechanics , RICE  Status On-going     PT LONG TERM GOAL #3   Title Pt will be able to walk for up to 30 min for fitness and report min increase in hip pain which resolves with rest and RICE   Status On-going     PT LONG TERM GOAL #4   Title Pt will be able to stand, walk and engage in recreation without limitation of pain    Status On-going     PT LONG TERM GOAL #5   Title Pt will demo 4+/5 strength in Hips for improved gait mechanics and stability.    Status On-going               Plan - 01/12/16 0940    Clinical Impression Statement PT doing well, did have a mod amt of pain in Rt. hip with standing hip abd (when full weight on Rt. LE)   PT Next Visit Plan  stabilization, progress hip abd and ext strength, AROM Rt hip and strength hip abd, ext    PT Home Exercise Plan LTR, hamstring stretch and bridge calf stretch, prepilates (not heel slide)   Consulted and Agree with Plan of Care Patient      Patient will benefit from skilled therapeutic intervention in order to improve the following deficits and impairments:  Decreased  balance, Decreased mobility, Decreased strength, Impaired flexibility, Pain, Obesity, Increased fascial restricitons, Difficulty walking, Decreased range of motion  Visit Diagnosis: Pain in right hip  Difficulty in walking, not elsewhere classified  Stiffness of right hip, not elsewhere classified  Muscle weakness (generalized)     Problem List Patient Active Problem List   Diagnosis Date Noted  . Pre-diabetes 10/25/2014  . Hypertension   . Hyperlipidemia   . Reflux   . Anemia     Jo [name] 01/12/2016, 9:54 AM  [facility] [address] [place], [place], [address] Phone: [phone]   Fax:  [phone]  Name: Jo [name] MRN: [id number] Date of Birth: Feb 02, 1945   Raeford Razor, PT 01/12/16 9:54 AM Phone: [phone] Fax: [phone]

## 2016-01-16 ENCOUNTER — Ambulatory Visit: Payer: Medicare Other | Attending: Family Medicine | Admitting: Physical Therapy

## 2016-01-16 DIAGNOSIS — M6281 Muscle weakness (generalized): Secondary | ICD-10-CM

## 2016-01-16 DIAGNOSIS — M25651 Stiffness of right hip, not elsewhere classified: Secondary | ICD-10-CM | POA: Diagnosis present

## 2016-01-16 DIAGNOSIS — R262 Difficulty in walking, not elsewhere classified: Secondary | ICD-10-CM | POA: Diagnosis present

## 2016-01-16 DIAGNOSIS — M25551 Pain in right hip: Secondary | ICD-10-CM

## 2016-01-16 NOTE — Therapy (Signed)
[place], [place], [address] Phone: [phone]   Fax:  [phone]  Physical Therapy Treatment/Discharge   Patient Details  Name: Jo [name] MRN: [id number] Date of Birth: [birth year] Referring Provider: Dr. Delia Chimes  Encounter Date: 01/16/2016      PT End of Session - 01/16/16 0855    Visit Number 5   Number of Visits 16   PT Start Time 0850   PT Stop Time 0945   PT Time Calculation (min) 55 min   Activity Tolerance Patient tolerated treatment well   Behavior During Therapy [facility] for tasks assessed/performed      Past Medical History:  Diagnosis Date  . Anemia   . Goiter   . Hyperlipidemia   . Hypertension   . Menopause age 45  . Reflux   . Spinal stenosis     Past Surgical History:  Procedure Laterality Date  . CERVICAL SPINE SURGERY  06/2010    There were no vitals filed for this visit.      Subjective Assessment - 01/16/16 0852    Subjective Doing really well, no pain to report this AM. Was very busy, did not do my ex as much as I should.    Currently in Pain? No/denies            [facility] PT Assessment - 01/16/16 0953      Observation/Other Assessments   Focus on Therapeutic Outcomes (FOTO)  24%     Strength   Right Hip ABduction 3+/5   Left Hip ABduction 4+/5             [facility] Adult PT Treatment/Exercise - 01/16/16 0906      Lumbar Exercises: Stretches   Active Hamstring Stretch 2 reps;30 seconds   Lower Trunk Rotation 5 reps   Pelvic Tilt 10 seconds   Pelvic Tilt Limitations x 10 post tilt      Lumbar Exercises: Aerobic   Stationary Bike NUstep L5 for 5 min UE and LE      Lumbar Exercises: Supine   Clam 10 reps;Other (comment)   Clam Limitations unilateral and bilateral sets    Bridge 10 reps   Bridge Limitations added bridge and clam combo with green band      Lumbar Exercises: Sidelying   Clam 20 reps   Clam Limitations green band    Hip  Abduction 10 reps   Hip Abduction Weights (lbs) green band      Knee/Hip Exercises: Stretches   Piriformis Stretch Both;2 reps;30 seconds     Knee/Hip Exercises: Standing   Heel Raises Both;1 set;20 reps   Hip Abduction Stengthening;Both;1 set;10 reps;Knee straight   Lateral Step Up Both;1 set;20 reps;Hand Hold: 1;Step Height: 6"   Functional Squat 1 set;15 reps   Functional Squat Limitations wide stance, cues to avoid mild lumbar flexion      Knee/Hip Exercises: Supine   Bridges with Clamshell Strengthening;Both;1 set;10 reps     Moist Heat Therapy   Number Minutes Moist Heat 15 Minutes   Moist Heat Location Lumbar Spine;Hip                PT Education - 01/16/16 0953    Education provided Yes   Education Details final DC HEP    Person(s) Educated Patient   Methods Explanation   Comprehension Verbalized understanding;Returned demonstration          PT Short Term Goals - 01/16/16 [phone]  PT SHORT TERM GOAL #1   Title Pt will be I with initial HEP   Status Achieved     PT SHORT TERM GOAL #2   Title Pt will be able to report less pain in hip and lower leg, improved by 20-25%   Status Achieved           PT Long Term Goals - 2016/02/05 0916      PT LONG TERM GOAL #1   Title Pt will be I with more advanced HEP    Status Achieved     PT LONG TERM GOAL #2   Title Pt will be I with concepts of posture and body mechanics , RICE    Status Achieved     PT LONG TERM GOAL #3   Title Pt will be able to walk for up to 30 min for fitness and report min increase in hip pain which resolves with rest and RICE   Baseline has been too cold    Status Unable to assess     PT LONG TERM GOAL #4   Title Pt will be able to stand, walk and engage in recreation without limitation of pain    Status Achieved     PT LONG TERM GOAL #5   Title Pt will demo 4+/5 strength in Hips for improved gait mechanics and stability.    Status Partially Met               Plan  - 02/05/16 0954    Clinical Impression Statement Pt has met most of her LTG and has decieed she will be able to finisher POC today.  She has several exercises which have helped her.  Strength in Rt. hip 3+/5 and has not formally returned to a structured exercise program (other than HEP).  She is not limited by pain functionally and FOTO has improved to 24% limited.    PT Next Visit Plan NA   PT Home Exercise Plan LTR, hamstring stretch and bridge calf stretch, prepilates (not heel slide), bridges, clam with bands    Consulted and Agree with Plan of Care Patient      Patient will benefit from skilled therapeutic intervention in order to improve the following deficits and impairments:     Visit Diagnosis: Pain in right hip  Difficulty in walking, not elsewhere classified  Stiffness of right hip, not elsewhere classified  Muscle weakness (generalized)       G-Codes - 02/05/16 0957    Functional Assessment Tool Used FOTO   Functional Limitation Mobility: Walking and moving around   Mobility: Walking and Moving Around Current Status [phone]) At least 20 percent but less than 40 percent impaired, limited or restricted   Mobility: Walking and Moving Around Goal Status [phone]) At least 20 percent but less than 40 percent impaired, limited or restricted   Mobility: Walking and Moving Around Discharge Status [phone]) At least 20 percent but less than 40 percent impaired, limited or restricted      Problem List Patient Active Problem List   Diagnosis Date Noted  . Pre-diabetes 10/25/2014  . Hypertension   . Hyperlipidemia   . Reflux   . Anemia     Jo [name] 02/05/16, 9:58 AM  [facility] [address] [place], [place], [address] Phone: [phone]   Fax:  [phone]  Name: Jo [name] MRN: [id number] Date of Birth: 06/05/1945  PHYSICAL THERAPY DISCHARGE SUMMARY  Visits from Start of Care: 5  Current functional  level related to goals / functional outcomes: See above    Remaining deficits: Min pain in Rt. Hip with weightbearing, intermittent , hip abd weakness, min core weakness   Education / Equipment: HEP, body mechanics  Plan: Patient agrees to discharge.  Patient goals were met. Patient is being discharged due to being pleased with the current functional level.  ?????      Raeford Razor, PT 01/16/16 9:59 AM Phone: [phone] Fax: [phone]

## 2016-01-18 ENCOUNTER — Ambulatory Visit: Payer: Medicare Other | Admitting: Physical Therapy

## 2016-01-22 ENCOUNTER — Ambulatory Visit: Payer: Medicare Other | Admitting: Physical Therapy

## 2016-01-25 ENCOUNTER — Ambulatory Visit: Payer: Medicare Other | Admitting: Physical Therapy

## 2016-04-01 ENCOUNTER — Other Ambulatory Visit: Payer: Self-pay | Admitting: Family Medicine

## 2016-04-01 DIAGNOSIS — I10 Essential (primary) hypertension: Secondary | ICD-10-CM

## 2016-04-01 DIAGNOSIS — E785 Hyperlipidemia, unspecified: Secondary | ICD-10-CM

## 2016-07-10 ENCOUNTER — Other Ambulatory Visit: Payer: Self-pay | Admitting: Family Medicine

## 2016-07-10 DIAGNOSIS — E785 Hyperlipidemia, unspecified: Secondary | ICD-10-CM

## 2016-07-10 DIAGNOSIS — I10 Essential (primary) hypertension: Secondary | ICD-10-CM

## 2016-09-25 ENCOUNTER — Other Ambulatory Visit: Payer: Self-pay | Admitting: Family Medicine

## 2016-09-25 DIAGNOSIS — I10 Essential (primary) hypertension: Secondary | ICD-10-CM

## 2016-09-25 DIAGNOSIS — E785 Hyperlipidemia, unspecified: Secondary | ICD-10-CM

## 2016-10-13 IMAGING — DX DG HIP (WITH OR WITHOUT PELVIS) 2-3V*R*
3 series · 3 of 3 positions shown · non-contrast
Comparison: None.

CLINICAL DATA: Pain without trauma

EXAM:
DG HIP (WITH OR WITHOUT PELVIS) 2-3V RIGHT

[pelvis ap]
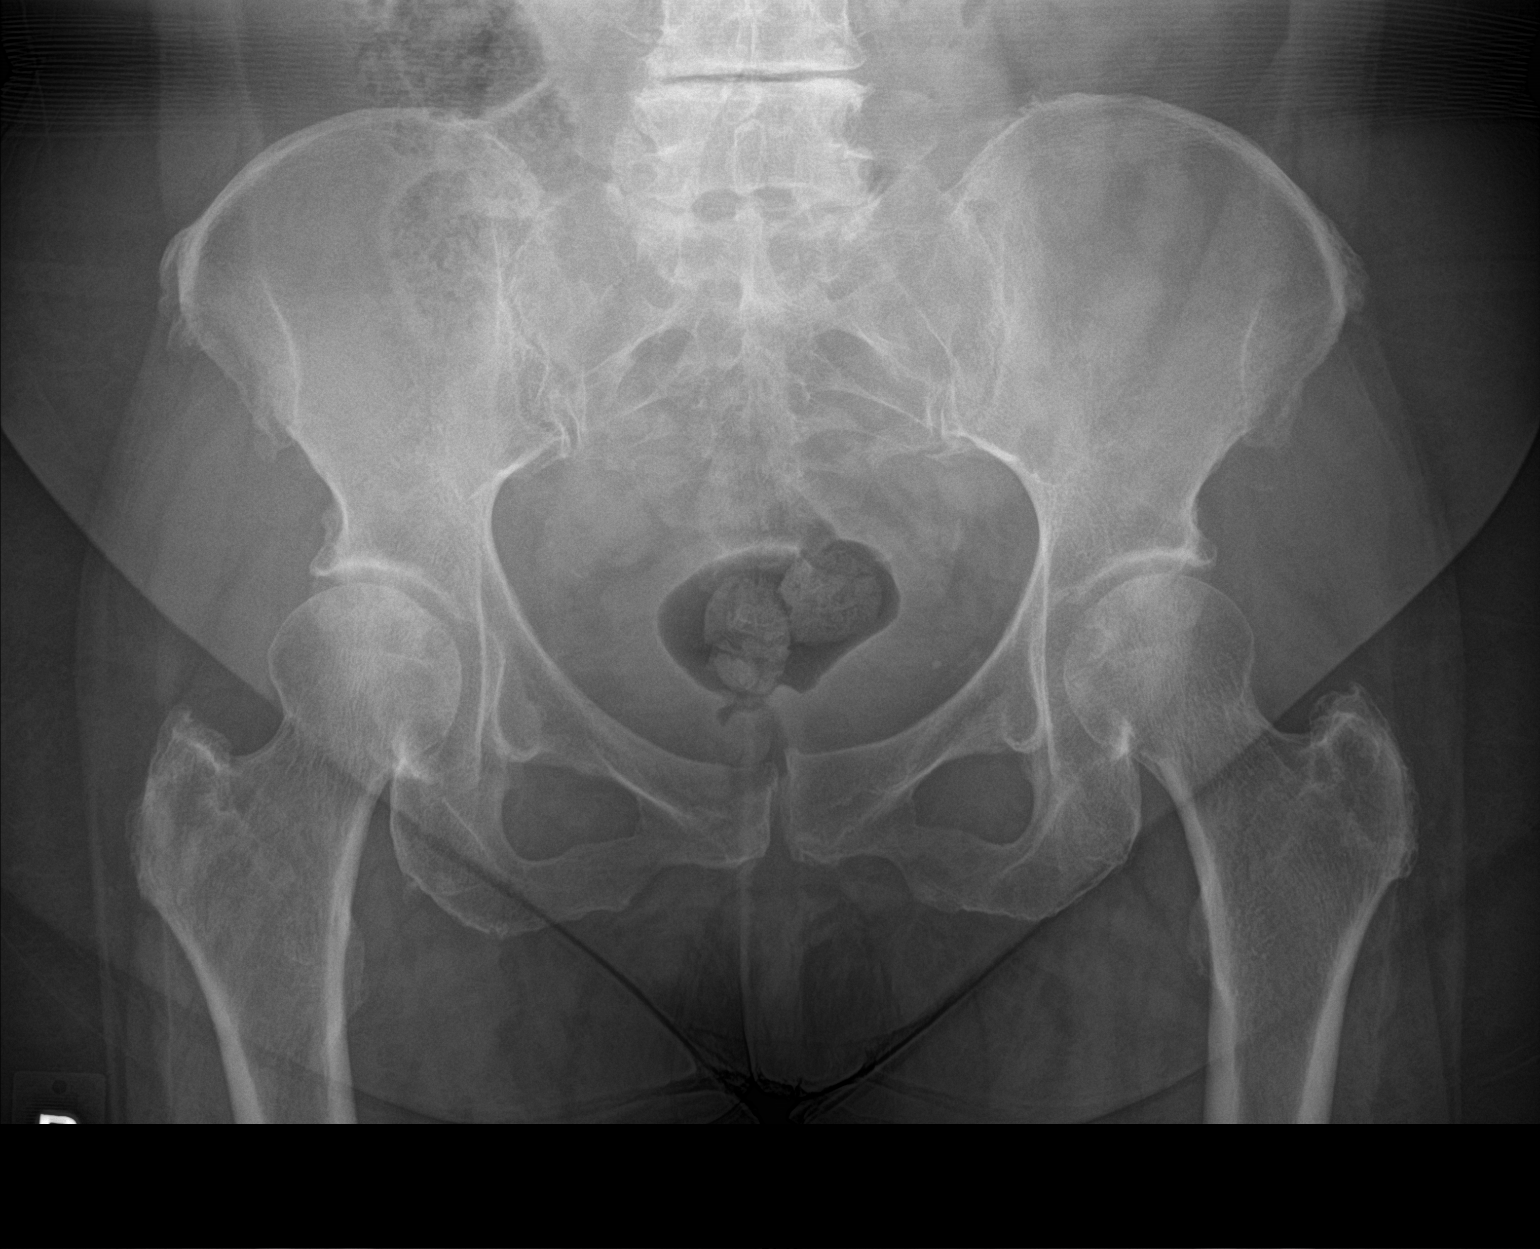

[hip ap]
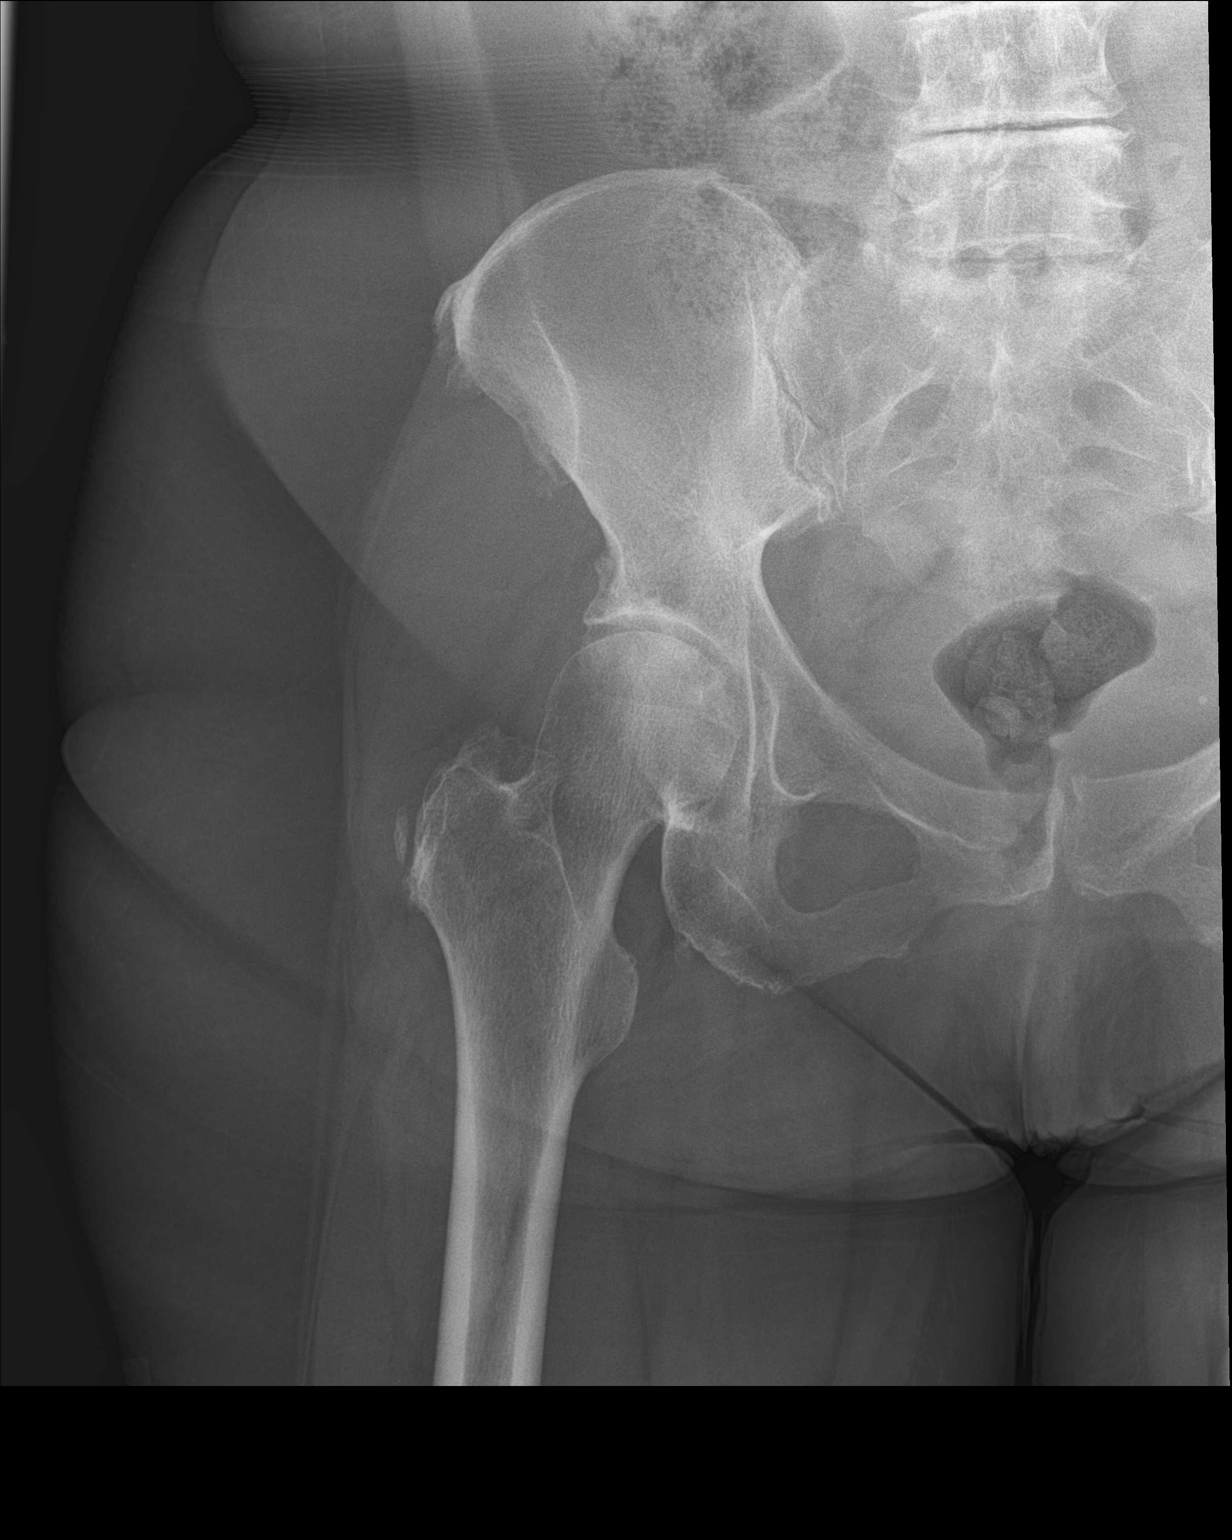

[hip lat]
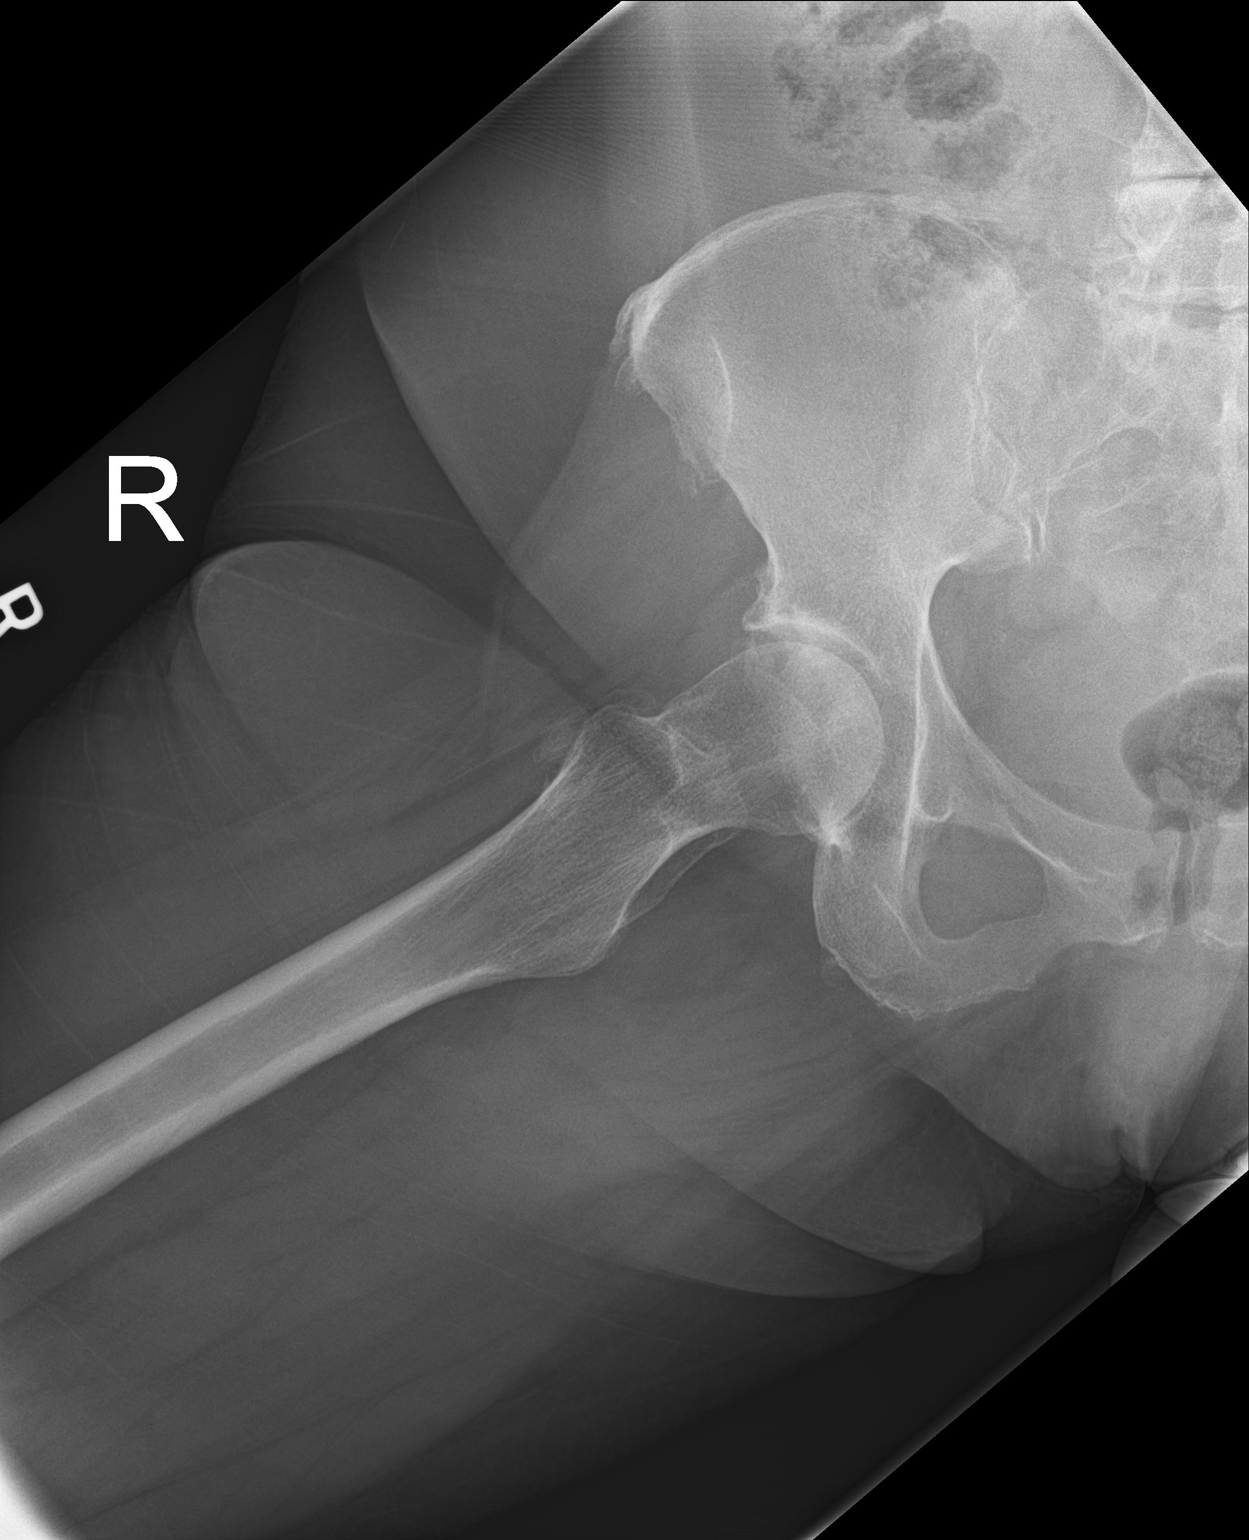

[3 of 3 positions shown; findings below may reference images not displayed]

FINDINGS: Mild degenerative changes with no fracture or dislocation.
Enthesopathic changes at the greater trocanter and iliac spine.
IMPRESSION: Degenerative changes.
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
## 2016-11-13 ENCOUNTER — Encounter: Payer: Self-pay | Admitting: Family Medicine

## 2016-11-13 ENCOUNTER — Other Ambulatory Visit: Payer: Self-pay | Admitting: Family Medicine

## 2016-11-13 ENCOUNTER — Ambulatory Visit (INDEPENDENT_AMBULATORY_CARE_PROVIDER_SITE_OTHER): Payer: Medicare Other | Admitting: Family Medicine

## 2016-11-13 VITALS — BP 128/60 | HR 101 | Temp 98.3°F | Resp 17 | Ht 66.0 in | Wt 183.6 lb

## 2016-11-13 DIAGNOSIS — N644 Mastodynia: Secondary | ICD-10-CM | POA: Diagnosis not present

## 2016-11-13 DIAGNOSIS — Z23 Encounter for immunization: Secondary | ICD-10-CM | POA: Diagnosis not present

## 2016-11-13 NOTE — Patient Instructions (Addendum)
We recommend that you schedule a mammogram for breast cancer screening. Typically, you do not need a referral to do this. Please contact a local imaging center to schedule your mammogram.  [facility] - [phone]  *ask for the Radiology Department [facility]) - [phone] or [phone]  [facility] - [phone] [place] [phone] [facility] - [phone]  *ask for the [facility] - [phone]  *ask for the Radiology Department [facility] - [phone]  *ask for the [place] - [phone]    IF you received an x-ray today, you will receive an invoice from [facility] Radiology. Please contact [facility] Radiology at [phone] with questions or concerns regarding your invoice.   IF you received labwork today, you will receive an invoice from [place]. Please contact [facility] at [phone] with questions or concerns regarding your invoice.   Our billing staff will not be able to assist you with questions regarding bills from these companies.  You will be contacted with the lab results as soon as they are available. The fastest way to get your results is to activate your My Chart account. Instructions are located on the last page of this paperwork. If you have not heard from [place] regarding the results in 2 weeks, please contact this office.     Mammogram A mammogram is an X-ray of the breasts that is done to check for changes that are not normal. This test can screen for and find any changes that may suggest breast cancer. This test can also help to find other changes and variations in the breast. What happens before the procedure?  Have this test done about 1-2 weeks after your period. This is usually when your breasts are the least tender.  If you are visiting a new doctor or clinic, send any past  mammogram images to your new doctor's office.  Wash your breasts and under your arms the day of the test.  Do not use deodorants, perfumes, lotions, or powders on the day of the test.  Take off any jewelry from your neck.  Wear clothes that you can change into and out of easily. What happens during the procedure?  You will undress from the waist up. You will put on a gown.  You will stand in front of the X-ray machine.  Each breast will be placed between two plastic or glass plates. The plates will press down on your breast for a few seconds. Try to stay as relaxed as possible. This does not cause any harm to your breasts. Any discomfort you feel will be very brief.  X-rays will be taken from different angles of each breast. The procedure may vary among doctors and hospitals. What happens after the procedure?  The mammogram will be looked at by a specialist (radiologist).  You may need to do certain parts of the test again. This depends on the quality of the images.  Ask when your test results will be ready. Make sure you get your test results.  You may go back to your normal activities. This information is not intended to replace advice given to you by your health care provider. Make sure you discuss any questions you have with your health care provider. Document Released: 03/29/2008 Document Revised: 06/08/2015 Document Reviewed: 03/11/2014 Elsevier Interactive Patient Education  [employer].

## 2016-11-18 ENCOUNTER — Ambulatory Visit
Admission: RE | Admit: 2016-11-18 | Discharge: 2016-11-18 | Disposition: A | Discharge status: Home / Self Care | Payer: Medicare Other | Source: Ambulatory Visit | Attending: Family Medicine | Admitting: Family Medicine

## 2016-11-18 ENCOUNTER — Ambulatory Visit: Payer: Medicare Other

## 2016-11-18 DIAGNOSIS — N644 Mastodynia: Secondary | ICD-10-CM

## 2016-11-21 ENCOUNTER — Encounter: Payer: Self-pay | Admitting: Radiology

## 2016-11-25 ENCOUNTER — Telehealth: Payer: Self-pay

## 2016-11-25 NOTE — Telephone Encounter (Signed)
Called pt to schedule Medicare Annual Wellness Visit with Nurse Health   Patient reports that she had someone from her insurance company come to her home and complete AWV earlier this year.   Jo [name], B.A.  Care Guide - [facility]

## 2016-12-16 ENCOUNTER — Encounter: Payer: Medicare Other | Admitting: Family Medicine

## 2016-12-18 ENCOUNTER — Encounter: Payer: Self-pay | Admitting: Family Medicine

## 2016-12-18 ENCOUNTER — Ambulatory Visit (INDEPENDENT_AMBULATORY_CARE_PROVIDER_SITE_OTHER): Payer: Medicare Other | Admitting: Family Medicine

## 2016-12-18 ENCOUNTER — Other Ambulatory Visit: Payer: Self-pay

## 2016-12-18 VITALS — BP 130/82 | HR 78 | Temp 98.6°F | Resp 16 | Ht 66.0 in | Wt 183.4 lb

## 2016-12-18 DIAGNOSIS — I10 Essential (primary) hypertension: Secondary | ICD-10-CM | POA: Diagnosis not present

## 2016-12-18 DIAGNOSIS — R7301 Impaired fasting glucose: Secondary | ICD-10-CM | POA: Diagnosis not present

## 2016-12-18 DIAGNOSIS — R7303 Prediabetes: Secondary | ICD-10-CM | POA: Diagnosis not present

## 2016-12-18 DIAGNOSIS — M25551 Pain in right hip: Secondary | ICD-10-CM

## 2016-12-18 DIAGNOSIS — D649 Anemia, unspecified: Secondary | ICD-10-CM

## 2016-12-18 DIAGNOSIS — E782 Mixed hyperlipidemia: Secondary | ICD-10-CM | POA: Diagnosis not present

## 2016-12-18 DIAGNOSIS — E785 Hyperlipidemia, unspecified: Secondary | ICD-10-CM

## 2016-12-18 MED ORDER — LISINOPRIL 10 MG PO TABS: 10.0000 mg | ORAL_TABLET | Freq: Every day | ORAL | 1 refills | Status: DC

## 2016-12-18 MED ORDER — ATORVASTATIN CALCIUM 20 MG PO TABS: 20.0000 mg | ORAL_TABLET | Freq: Every day | ORAL | 3 refills | Status: DC

## 2016-12-18 MED ORDER — MELOXICAM 15 MG PO TABS
7.5000 mg | ORAL_TABLET | Freq: Every day | ORAL | 1 refills | Status: DC
Start: 2016-12-18 — End: 2017-09-16

## 2016-12-18 NOTE — Assessment & Plan Note (Signed)
Discussed bp control with Lisinopril.

## 2016-12-18 NOTE — Assessment & Plan Note (Signed)
Will monitor. Continue exercise. Patient is doing well with her lifestyle.

## 2016-12-18 NOTE — Addendum Note (Signed)
Addended by: Delia Chimes A on: 12/18/2016 09:15 AM   Modules accepted: Orders

## 2016-12-18 NOTE — Assessment & Plan Note (Signed)
Continue lipitor, no side effects noted. Continue heart healthy diet. CMP and FLP

## 2016-12-18 NOTE — Progress Notes (Signed)
Chief Complaint  Patient presents with  . Anemia    already had medicare wellness exam    HPI  Prediabetes Pt reports that she exercises doing yard work She states that she feels like it is well controlled Pt denies increased thirst, numbness or tingling. She sticks to a mostly diabetic diet.  Lab Results  Component Value Date   HGBA1C 5.8 (H) 12/13/2015    Hypertension: Patient here for follow-up of elevated blood pressure. She is exercising and is adherent to low salt diet.  Blood pressure is well controlled at home. Cardiac symptoms none. Patient denies chest pain, chest pressure/discomfort, dyspnea, exertional chest pressure/discomfort, irregular heart beat, lower extremity edema and palpitations.  Cardiovascular risk factors: advanced age (older than 95 for men, 25 for women), diabetes mellitus, hypertension and obesity (BMI >= 30 kg/m2). Use of agents associated with hypertension: NSAIDS. History of target organ damage: none. BP Readings from Last 3 Encounters:  12/18/16 130/82  11/13/16 128/60  12/13/15 128/70   Anemia: Patient presents for presents evaluation of anemia. Anemia was found by routine CBC.  It has been present for 1 year. She had her stool cards for colon cancer screening and had normal results. [employer] arranged it with a nurse who did her medicare wellness visit.  She denies abdominal pain, dizziness/lightheadedness, dyspnea, fatigue, melena, numbness/paresthesias and pallor.   Hyperlipidemia: Patient presents with hyperlipidemia.  She was tested because routine screening. She denies chest pain, dyspnea, exertional chest pressure/discomfort, fatigue, lower extremity edema and palpitations. There is not a family history of hyperlipidemia. There is not a family history of early ischemia heart disease.  The 10-year ASCVD risk score Mikey Bussing DC Brooke Bonito., et al., 2013) is: 13.1%   Values used to calculate the score:     Age: 71 years     Sex: Female     Is  Non-Hispanic African American: Yes     Diabetic: No     Tobacco smoker: No     Systolic Blood Pressure: 824 mmHg     Is BP treated: Yes     HDL Cholesterol: 72 mg/dL     Total Cholesterol: 180 mg/dL   Past Medical History:  Diagnosis Date  . Anemia   . Goiter   . Hyperlipidemia   . Hypertension   . Menopause age 24  . Reflux   . Spinal stenosis     Current Outpatient Medications  Medication Sig Dispense Refill  . aspirin 81 MG tablet Take 81 mg by mouth daily.    Marland Kitchen atorvastatin (LIPITOR) 20 MG tablet TAKE 1 TABLET BY MOUTH ONCE DAILY 90 tablet 0  . Cholecalciferol (VITAMIN D3 PO) Take 2,000 Units by mouth daily. Reported on 01/25/2015    . cyclobenzaprine (FLEXERIL) 5 MG tablet Take 1-2 tablets (5-10 mg total) by mouth 3 (three) times daily as needed for muscle spasms. 30 tablet 1  . ferrous fumarate (HEMOCYTE - 106 MG FE) 325 (106 FE) MG TABS tablet Take 1 tablet by mouth.    Marland Kitchen lisinopril (PRINIVIL,ZESTRIL) 10 MG tablet TAKE 1 TABLET BY MOUTH ONCE DAILY 90 tablet 0  . magnesium 30 MG tablet Take 30 mg by mouth 2 (two) times daily. Reported on 01/25/2015    . meloxicam (MOBIC) 15 MG tablet Take 0.5-1 tablets (7.5-15 mg total) by mouth daily. 30 tablet 0  . Multiple Vitamin (MULTIVITAMIN) tablet Take 1 tablet by mouth daily.    . vitamin B-12 (CYANOCOBALAMIN) 1000 MCG tablet Take 1,000 mcg by mouth daily.  No current facility-administered medications for this visit.     Allergies:  Allergies  Allergen Reactions  . Demerol Nausea And Vomiting    VERY SICK    Past Surgical History:  Procedure Laterality Date  . CERVICAL SPINE SURGERY  06/2010    Social History   Socioeconomic History  . Marital status: Widowed    Spouse name: None  . Number of children: 3  . Years of education: college  . Highest education level: None  Social Needs  . Financial resource strain: None  . Food insecurity - worry: None  . Food insecurity - inability: None  . Transportation needs  - medical: None  . Transportation needs - non-medical: None  Occupational History  . 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[employer]  Tobacco Use  . Smoking status: Never Smoker  . Smokeless tobacco: Never Used  Substance and Sexual Activity  . Alcohol use: No    Comment: glass of wine-special occasions  . Drug use: No  . Sexual activity: No    Birth control/protection: Abstinence  Other Topics Concern  . None  Social History Narrative  . None    Family History  Problem Relation Age of Onset  . Stroke Mother   . Hypertension Mother   . Heart disease Mother   . Heart disease Father   . Hypertension Daughter   . Cancer Sister   . Breast cancer Sister   . Cancer Brother   . Cancer Sister   . Hypertension Unknown   . Cancer Unknown        lung x 2     ROS Review of Systems See HPI Constitution: No fevers or chills No malaise No diaphoresis Skin: No rash or itching Eyes: no blurry vision, no double vision GU: no dysuria or hematuria Neuro: no dizziness or headaches all others reviewed and negative   Objective: Vitals:   12/18/16 0824  BP: 130/82  Pulse: 78  Resp: 16  Temp: 98.6 F (37 C)  SpO2: 99%  Weight: 183 lb 6.4 oz (83.2 kg)  Height: 5\' 6"  (1.676 m)    Physical Exam  Constitutional: She is oriented to person, place, and time. She appears well-developed and well-nourished.  HENT:  Head: Normocephalic and atraumatic.  Eyes: Conjunctivae and EOM are normal.  Cardiovascular: Normal rate, regular rhythm and normal heart sounds.  No murmur heard. Pulmonary/Chest: Effort normal and breath sounds normal. No stridor. No respiratory distress.  Abdominal: Soft. Bowel sounds are normal. She exhibits no distension. There is no tenderness. There is no guarding.  Neurological: She is alert and oriented to person, place, and time.  Psychiatric: She has a normal mood and affect. Her behavior is normal. Judgment and thought content normal.     Assessment and  Plan  Problem List Items Addressed This Visit      Cardiovascular and Mediastinum   Hypertension    Discussed bp control with Lisinopril.         Endocrine   Impaired fasting glucose   Relevant Orders   Hemoglobin A1c     Other   Hyperlipidemia    Continue lipitor, no side effects noted. Continue heart healthy diet. CMP and FLP      Relevant Orders   Lipid panel   Comprehensive metabolic panel   Mild anemia - Primary    Continues to have mild anemia. Iron rich foods advised.       Relevant Orders   CBC   Pre-diabetes  Will monitor. Continue exercise. Patient is doing well with her lifestyle.           [place]

## 2016-12-18 NOTE — Assessment & Plan Note (Signed)
Continues to have mild anemia. Iron rich foods advised.

## 2016-12-19 ENCOUNTER — Encounter: Payer: Self-pay | Admitting: Family Medicine

## 2016-12-19 ENCOUNTER — Telehealth: Payer: Self-pay

## 2016-12-19 LAB — CBC
Hematocrit: 35.5 % (ref 34.0–46.6)
Hemoglobin: 11.5 g/dL (ref 11.1–15.9)
MCH: 28.9 pg (ref 26.6–33.0)
MCHC: 32.4 g/dL (ref 31.5–35.7)
MCV: 89 fL (ref 79–97)
Platelets: 232 10*3/uL (ref 150–379)
RBC: 3.98 x10E6/uL (ref 3.77–5.28)
RDW: 14.6 % (ref 12.3–15.4)
WBC: 7 10*3/uL (ref 3.4–10.8)

## 2016-12-19 LAB — COMPREHENSIVE METABOLIC PANEL
ALT: 13 IU/L (ref 0–32)
AST: 20 IU/L (ref 0–40)
Albumin/Globulin Ratio: 1.5 (ref 1.2–2.2)
Albumin: 4.1 g/dL (ref 3.5–4.8)
Alkaline Phosphatase: 74 IU/L (ref 39–117)
BUN/Creatinine Ratio: 19 (ref 12–28)
BUN: 18 mg/dL (ref 8–27)
Bilirubin Total: 0.5 mg/dL (ref 0.0–1.2)
CO2: 25 mmol/L (ref 20–29)
Calcium: 9.5 mg/dL (ref 8.7–10.3)
Chloride: 105 mmol/L (ref 96–106)
Creatinine, Ser: 0.97 mg/dL (ref 0.57–1.00)
GFR calc Af Amer: 67 mL/min/{1.73_m2} (ref >59)
GFR calc non Af Amer: 59 mL/min/{1.73_m2} — ABNORMAL LOW (ref >59)
Globulin, Total: 2.8 g/dL (ref 1.5–4.5)
Glucose: 84 mg/dL (ref 65–99)
Potassium: 4.5 mmol/L (ref 3.5–5.2)
Sodium: 142 mmol/L (ref 134–144)
Total Protein: 6.9 g/dL (ref 6.0–8.5)

## 2016-12-19 LAB — LIPID PANEL
Chol/HDL Ratio: 2.7 ratio (ref 0.0–4.4)
Cholesterol, Total: 173 mg/dL (ref 100–199)
HDL: 64 mg/dL (ref >39)
LDL Calculated: 98 mg/dL (ref 0–99)
Triglycerides: 56 mg/dL (ref 0–149)
VLDL Cholesterol Cal: 11 mg/dL (ref 5–40)

## 2016-12-19 LAB — HEMOGLOBIN A1C
Est. average glucose Bld gHb Est-mCnc: 123 mg/dL
Hgb A1c MFr Bld: 5.9 % — ABNORMAL HIGH (ref 4.8–5.6)

## 2016-12-19 NOTE — Telephone Encounter (Signed)
Pt lab results sent via mail to her home address at: [address], [place], [place] [address]. DGaddy, CMA

## 2017-06-23 ENCOUNTER — Other Ambulatory Visit: Payer: Self-pay

## 2017-06-23 ENCOUNTER — Encounter: Payer: Self-pay | Admitting: Family Medicine

## 2017-06-23 ENCOUNTER — Ambulatory Visit (INDEPENDENT_AMBULATORY_CARE_PROVIDER_SITE_OTHER): Payer: Medicare Other | Admitting: Family Medicine

## 2017-06-23 VITALS — BP 138/80 | HR 72 | Temp 98.9°F | Ht 66.5 in | Wt 185.2 lb

## 2017-06-23 DIAGNOSIS — R7303 Prediabetes: Secondary | ICD-10-CM | POA: Diagnosis not present

## 2017-06-23 DIAGNOSIS — I10 Essential (primary) hypertension: Secondary | ICD-10-CM | POA: Diagnosis not present

## 2017-06-23 DIAGNOSIS — E785 Hyperlipidemia, unspecified: Secondary | ICD-10-CM | POA: Diagnosis not present

## 2017-06-23 DIAGNOSIS — D649 Anemia, unspecified: Secondary | ICD-10-CM | POA: Diagnosis not present

## 2017-06-23 LAB — CMP14+EGFR
ALT: 10 IU/L (ref 0–32)
AST: 15 IU/L (ref 0–40)
Albumin/Globulin Ratio: 1.3 (ref 1.2–2.2)
Albumin: 3.8 g/dL (ref 3.5–4.8)
Alkaline Phosphatase: 73 IU/L (ref 39–117)
BUN/Creatinine Ratio: 14 (ref 12–28)
BUN: 15 mg/dL (ref 8–27)
Bilirubin Total: 0.4 mg/dL (ref 0.0–1.2)
CO2: 24 mmol/L (ref 20–29)
Calcium: 10 mg/dL (ref 8.7–10.3)
Chloride: 105 mmol/L (ref 96–106)
Creatinine, Ser: 1.05 mg/dL — ABNORMAL HIGH (ref 0.57–1.00)
GFR calc Af Amer: 61 mL/min/{1.73_m2} (ref >59)
GFR calc non Af Amer: 53 mL/min/{1.73_m2} — ABNORMAL LOW (ref >59)
Globulin, Total: 3 g/dL (ref 1.5–4.5)
Glucose: 94 mg/dL (ref 65–99)
Potassium: 5.2 mmol/L (ref 3.5–5.2)
Sodium: 141 mmol/L (ref 134–144)
Total Protein: 6.8 g/dL (ref 6.0–8.5)

## 2017-06-23 LAB — CBC
Hematocrit: 34.6 % (ref 34.0–46.6)
Hemoglobin: 11 g/dL — ABNORMAL LOW (ref 11.1–15.9)
MCH: 28.1 pg (ref 26.6–33.0)
MCHC: 31.8 g/dL (ref 31.5–35.7)
MCV: 89 fL (ref 79–97)
Platelets: 276 10*3/uL (ref 150–450)
RBC: 3.91 x10E6/uL (ref 3.77–5.28)
RDW: 14.7 % (ref 12.3–15.4)
WBC: 7.3 10*3/uL (ref 3.4–10.8)

## 2017-06-23 LAB — HEMOGLOBIN A1C
Est. average glucose Bld gHb Est-mCnc: 126 mg/dL
Hgb A1c MFr Bld: 6 % — ABNORMAL HIGH (ref 4.8–5.6)

## 2017-06-23 MED ORDER — LISINOPRIL 10 MG PO TABS: 10.0000 mg | ORAL_TABLET | Freq: Every day | ORAL | 1 refills | Status: DC

## 2017-06-23 NOTE — Progress Notes (Signed)
Chief Complaint  Patient presents with  . Follow-up    Blood pressure management    HPI   Hypertension: Patient here for follow-up of elevated blood pressure. She is exercising and is adherent to low salt diet.  Blood pressure is well controlled at home. Cardiac symptoms none. Patient denies chest pain, chest pressure/discomfort, dyspnea, exertional chest pressure/discomfort, fatigue, irregular heart beat, lower extremity edema, orthopnea, palpitations and syncope.  Cardiovascular risk factors: advanced age (older than 57 for men, 74 for women), dyslipidemia and hypertension. Use of agents associated with hypertension: none. History of target organ damage: none. BP Readings from Last 3 Encounters:  06/23/17 138/80  12/18/16 130/82  11/13/16 128/60   Prediabetes She reports that she has been traveling until recently going until she returned this past week She states that she has been trying to eat healthy She had some fast foods and restaurant foods She had a hard time picking healthy choices while in [place], [place] for her grandson's basketball tournamet She tried to do salads Wt Readings from Last 3 Encounters:  06/23/17 185 lb 3.2 oz (84 kg)  12/18/16 183 lb 6.4 oz (83.2 kg)  11/13/16 183 lb 9.6 oz (83.3 kg)    Lab Results  Component Value Date   HGBA1C 5.9 (H) 12/18/2016   Mild anemia She reports some fatigue  She states that she takes her iron supplement that there are times when she cannot tolerate it so she might take 1-2 a week She reports that she eats more iron rich foods  Lab Results  Component Value Date   WBC 7.0 12/18/2016   HGB 11.5 12/18/2016   HCT 35.5 12/18/2016   MCV 89 12/18/2016   PLT 232 12/18/2016     Past Medical History:  Diagnosis Date  . Anemia   . Goiter   . Hyperlipidemia   . Hypertension   . Menopause age 87  . Reflux   . Spinal stenosis     Current Outpatient Medications  Medication Sig Dispense Refill  . aspirin 81 MG tablet Take  81 mg by mouth daily.    Marland Kitchen atorvastatin (LIPITOR) 20 MG tablet Take 1 tablet (20 mg total) by mouth daily. 90 tablet 3  . ferrous fumarate (HEMOCYTE - 106 MG FE) 325 (106 FE) MG TABS tablet Take 1 tablet by mouth.    Marland Kitchen lisinopril (PRINIVIL,ZESTRIL) 10 MG tablet Take 1 tablet (10 mg total) by mouth daily. 90 tablet 1  . Multiple Vitamin (MULTIVITAMIN) tablet Take 1 tablet by mouth daily.    . vitamin B-12 (CYANOCOBALAMIN) 1000 MCG tablet Take 1,000 mcg by mouth daily.    . Cholecalciferol (VITAMIN D3 PO) Take 2,000 Units by mouth daily. Reported on 01/25/2015    . cyclobenzaprine (FLEXERIL) 5 MG tablet Take 1-2 tablets (5-10 mg total) by mouth 3 (three) times daily as needed for muscle spasms. (Patient not taking: Reported on 06/23/2017) 30 tablet 1  . magnesium 30 MG tablet Take 30 mg by mouth 2 (two) times daily. Reported on 01/25/2015    . meloxicam (MOBIC) 15 MG tablet Take 0.5-1 tablets (7.5-15 mg total) by mouth daily. (Patient not taking: Reported on 06/23/2017) 30 tablet 1   No current facility-administered medications for this visit.     Allergies:  Allergies  Allergen Reactions  . Demerol Nausea And Vomiting    VERY SICK    Past Surgical History:  Procedure Laterality Date  . CERVICAL SPINE SURGERY  06/2010    Social History  Socioeconomic History  . Marital status: Widowed    Spouse name: Not on file  . Number of children: 3  . Years of education: college  . Highest education level: Not on file  Occupational History  . 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[employer]  . Financial resource strain: Not on file  . Food insecurity:    Worry: Not on file    Inability: Not on file  . Transportation needs:    Medical: Not on file    Non-medical: Not on file  Tobacco Use  . Smoking status: Never Smoker  . Smokeless tobacco: Never Used  Substance and Sexual Activity  . Alcohol use: No    Comment: glass of wine-special occasions  . Drug use: No  . Sexual  activity: Never    Birth control/protection: Abstinence  Lifestyle  . Physical activity:    Days per week: Not on file    Minutes per session: Not on file  . Stress: Not on file  Relationships  . Social connections:    Talks on phone: Not on file    Gets together: Not on file    Attends religious service: Not on file    Active member of club or organization: Not on file    Attends meetings of clubs or organizations: Not on file    Relationship status: Not on file  Other Topics Concern  . Not on file  Social History Narrative  . Not on file    Family History  Problem Relation Age of Onset  . Stroke Mother   . Hypertension Mother   . Heart disease Mother   . Heart disease Father   . Hypertension Daughter   . Cancer Sister   . Breast cancer Sister   . Cancer Brother   . Cancer Sister   . Hypertension Unknown   . Cancer Unknown        lung x 2     ROS Review of Systems See HPI Constitution: No fevers or chills No malaise No diaphoresis Skin: No rash or itching Eyes: no blurry vision, no double vision GU: no dysuria or hematuria Neuro: no dizziness or headaches all others reviewed and negative   Objective: Vitals:   06/23/17 0825  BP: 138/80  Pulse: 72  Temp: 98.9 F (37.2 C)  TempSrc: Oral  SpO2: 99%  Weight: 185 lb 3.2 oz (84 kg)  Height: 5' 6.5" (1.689 m)    Physical Exam  Physical Exam  Constitutional: She is oriented to person, place, and time. She appears well-developed and well-nourished.  HENT:  Head: Normocephalic and atraumatic.  Eyes: Conjunctivae and EOM are normal.  Cardiovascular: Normal rate, regular rhythm and normal heart sounds.   Pulmonary/Chest: Effort normal and breath sounds normal. No respiratory distress. She has no wheezes.  Abdominal: Normal appearance and bowel sounds are normal. There is no tenderness. There is no CVA tenderness.  Neurological: She is alert and oriented to person, place, and time.     Assessment and  Plan Jo [name] was seen today for follow-up.  Diagnoses and all orders for this visit:  Essential hypertension- bp at goal for geriatric pt She is tolerating lisinopril without side effects Discussed DASH diet Resume exercise -     CMP14+EGFR  Mild anemia- discussed iron rich foods -     CBC  Pre-diabetes- discussed insulin resistance, discussed that if her a1c increases she will start metformin at the lowest dose with the plan to also  implement lifestyle changes -     Hemoglobin A1c   3 months follow up for anemia  Jo [name]

## 2017-06-23 NOTE — Patient Instructions (Addendum)
  IF you received an x-ray today, you will receive an invoice from [facility]. Please contact [facility] at [phone] with questions or concerns regarding your invoice.   IF you received labwork today, you will receive an invoice from [facility]. Please contact [facility] at [phone] with questions or concerns regarding your invoice.   Our billing staff will not be able to assist you with questions regarding bills from these companies.  You will be contacted with the lab results as soon as they are available. The fastest way to get your results is to activate your My Chart account. Instructions are located on the last page of this paperwork. If you have not heard from us regarding the results in 2 weeks, please contact this office.     Iron-Rich Diet Iron is a mineral that helps your body to produce hemoglobin. Hemoglobin is a protein in your red blood cells that carries oxygen to your body's tissues. Eating too little iron may cause you to feel weak and tired, and it can increase your risk for infection. Eating enough iron is necessary for your body's metabolism, muscle function, and nervous system. Iron is naturally found in many foods. It can also be added to foods or fortified in foods. There are two types of dietary iron:  Heme iron. Heme iron is absorbed by the body more easily than nonheme iron. Heme iron is found in meat, poultry, and fish.  Nonheme iron. Nonheme iron is found in dietary supplements, iron-fortified grains, beans, and vegetables.  You may need to follow an iron-rich diet if:  You have been diagnosed with iron deficiency or iron-deficiency anemia.  You have a condition that prevents you from absorbing dietary iron, such as: ? Infection in your intestines. ? Celiac disease. This involves long-lasting (chronic) inflammation of your intestines.  You do not eat enough iron.  You eat a diet that is high in foods that impair iron  absorption.  You have lost a lot of blood.  You have heavy bleeding during your menstrual cycle.  You are pregnant.  What is my plan? Your health care provider may help you to determine how much iron you need per day based on your condition. Generally, when a person consumes sufficient amounts of iron in the diet, the following iron needs are met:  Men. ? 14-18 years old: 11 mg per day. ? 19-50 years old: 8 mg per day.  Women. ? 14-18 years old: 15 mg per day. ? 19-50 years old: 18 mg per day. ? Over 50 years old: 8 mg per day. ? Pregnant women: 27 mg per day. ? Breastfeeding women: 9 mg per day.  What do I need to know about an iron-rich diet?  Eat fresh fruits and vegetables that are high in vitamin C along with foods that are high in iron. This will help increase the amount of iron that your body absorbs from food, especially with foods containing nonheme iron. Foods that are high in vitamin C include oranges, peppers, tomatoes, and mango.  Take iron supplements only as directed by your health care provider. Overdose of iron can be life-threatening. If you were prescribed iron supplements, take them with orange juice or a vitamin C supplement.  Cook foods in pots and pans that are made from iron.  Eat nonheme iron-containing foods alongside foods that are high in heme iron. This helps to improve your iron absorption.  Certain foods and drinks contain compounds that impair iron absorption. Avoid eating   these foods in the same meal as iron-rich foods or with iron supplements. These include: ? Coffee, black tea, and red wine. ? Milk, dairy products, and foods that are high in calcium. ? Beans, soybeans, and peas. ? Whole grains.  When eating foods that contain both nonheme iron and compounds that impair iron absorption, follow these tips to absorb iron better. ? Soak beans overnight before cooking. ? Soak whole grains overnight and drain them before using. ? Ferment flours  before baking, such as using yeast in bread dough. What foods can I eat? Grains Iron-fortified breakfast cereal. Iron-fortified whole-wheat bread. Enriched rice. Sprouted grains. Vegetables Spinach. Potatoes with skin. Green peas. Broccoli. Red and green bell peppers. Fermented vegetables. Fruits Prunes. Raisins. Oranges. Strawberries. Mango. Grapefruit. Meats and Other Protein Sources Beef liver. Oysters. Beef. Shrimp. Turkey. Chicken. Tuna. Sardines. Chickpeas. Nuts. Tofu. Beverages Tomato juice. Fresh orange juice. Prune juice. Hibiscus tea. Fortified instant breakfast shakes. Condiments Tahini. Fermented soy sauce. Sweets and Desserts Black-strap molasses. Other Wheat germ. The items listed above may not be a complete list of recommended foods or beverages. Contact your dietitian for more options. What foods are not recommended? Grains Whole grains. Bran cereal. Bran flour. Oats. Vegetables Artichokes. Brussels sprouts. Kale. Fruits Blueberries. Raspberries. Strawberries. Figs. Meats and Other Protein Sources Soybeans. Products made from soy protein. Dairy Milk. Cream. Cheese. Yogurt. Cottage cheese. Beverages Coffee. Black tea. Red wine. Sweets and Desserts Cocoa. Chocolate. Ice cream. Other Basil. Oregano. Parsley. The items listed above may not be a complete list of foods and beverages to avoid. Contact your dietitian for more information. This information is not intended to replace advice given to you by your health care provider. Make sure you discuss any questions you have with your health care provider. Document Released: 08/14/2004 Document Revised: 07/21/2015 Document Reviewed: 07/28/2013 Elsevier Interactive Patient Education  2018 Elsevier Inc.  

## 2017-06-23 NOTE — Addendum Note (Signed)
Addended by: Delia Chimes A on: 06/23/2017 09:02 AM   Modules accepted: Orders

## 2017-08-28 ENCOUNTER — Telehealth: Payer: Self-pay | Admitting: Family Medicine

## 2017-08-28 NOTE — Telephone Encounter (Signed)
Left a voicemail in regards to her appt she has with Dr. Nolon Rod on 09/25/2017. The provider will not be in the office on that day and will need to be rescheduled.

## 2017-09-14 ENCOUNTER — Observation Stay (HOSPITAL_COMMUNITY)
Admission: EM | Admit: 2017-09-14 | Discharge: 2017-09-16 | Disposition: A | Discharge status: Home / Self Care | Payer: Medicare Other | Attending: Internal Medicine | Admitting: Internal Medicine

## 2017-09-14 ENCOUNTER — Emergency Department (HOSPITAL_COMMUNITY): Payer: Medicare Other

## 2017-09-14 ENCOUNTER — Encounter (HOSPITAL_COMMUNITY): Payer: Self-pay | Admitting: Emergency Medicine

## 2017-09-14 DIAGNOSIS — Z7982 Long term (current) use of aspirin: Secondary | ICD-10-CM | POA: Diagnosis not present

## 2017-09-14 DIAGNOSIS — N182 Chronic kidney disease, stage 2 (mild): Secondary | ICD-10-CM | POA: Diagnosis not present

## 2017-09-14 DIAGNOSIS — I5030 Unspecified diastolic (congestive) heart failure: Secondary | ICD-10-CM | POA: Insufficient documentation

## 2017-09-14 DIAGNOSIS — K219 Gastro-esophageal reflux disease without esophagitis: Secondary | ICD-10-CM | POA: Diagnosis not present

## 2017-09-14 DIAGNOSIS — Z9889 Other specified postprocedural states: Secondary | ICD-10-CM | POA: Diagnosis not present

## 2017-09-14 DIAGNOSIS — R7303 Prediabetes: Secondary | ICD-10-CM | POA: Insufficient documentation

## 2017-09-14 DIAGNOSIS — Z885 Allergy status to narcotic agent status: Secondary | ICD-10-CM | POA: Insufficient documentation

## 2017-09-14 DIAGNOSIS — D509 Iron deficiency anemia, unspecified: Secondary | ICD-10-CM | POA: Diagnosis not present

## 2017-09-14 DIAGNOSIS — R0789 Other chest pain: Secondary | ICD-10-CM | POA: Diagnosis not present

## 2017-09-14 DIAGNOSIS — E875 Hyperkalemia: Secondary | ICD-10-CM | POA: Diagnosis not present

## 2017-09-14 DIAGNOSIS — R079 Chest pain, unspecified: Secondary | ICD-10-CM | POA: Diagnosis present

## 2017-09-14 DIAGNOSIS — I051 Rheumatic mitral insufficiency: Secondary | ICD-10-CM | POA: Insufficient documentation

## 2017-09-14 DIAGNOSIS — E785 Hyperlipidemia, unspecified: Secondary | ICD-10-CM | POA: Diagnosis not present

## 2017-09-14 DIAGNOSIS — I129 Hypertensive chronic kidney disease with stage 1 through stage 4 chronic kidney disease, or unspecified chronic kidney disease: Secondary | ICD-10-CM | POA: Diagnosis not present

## 2017-09-14 DIAGNOSIS — Z823 Family history of stroke: Secondary | ICD-10-CM | POA: Insufficient documentation

## 2017-09-14 DIAGNOSIS — Z8249 Family history of ischemic heart disease and other diseases of the circulatory system: Secondary | ICD-10-CM | POA: Diagnosis not present

## 2017-09-14 DIAGNOSIS — I420 Dilated cardiomyopathy: Secondary | ICD-10-CM | POA: Insufficient documentation

## 2017-09-14 DIAGNOSIS — R918 Other nonspecific abnormal finding of lung field: Secondary | ICD-10-CM | POA: Diagnosis not present

## 2017-09-14 DIAGNOSIS — Z79899 Other long term (current) drug therapy: Secondary | ICD-10-CM | POA: Diagnosis not present

## 2017-09-14 LAB — HEPATIC FUNCTION PANEL
ALT: 15 U/L (ref 0–44)
AST: 19 U/L (ref 15–41)
Albumin: 3.5 g/dL (ref 3.5–5.0)
Alkaline Phosphatase: 70 U/L (ref 38–126)
Bilirubin, Direct: 0.1 mg/dL (ref 0.0–0.2)
Indirect Bilirubin: 0.5 mg/dL (ref 0.3–0.9)
Total Bilirubin: 0.6 mg/dL (ref 0.3–1.2)
Total Protein: 6.9 g/dL (ref 6.5–8.1)

## 2017-09-14 LAB — BASIC METABOLIC PANEL
Anion gap: 9 (ref 5–15)
BUN: 16 mg/dL (ref 8–23)
CO2: 27 mmol/L (ref 22–32)
Calcium: 9.9 mg/dL (ref 8.9–10.3)
Chloride: 103 mmol/L (ref 98–111)
Creatinine, Ser: 1.2 mg/dL — ABNORMAL HIGH (ref 0.44–1.00)
GFR calc Af Amer: 51 mL/min — ABNORMAL LOW (ref >60)
GFR calc non Af Amer: 44 mL/min — ABNORMAL LOW (ref >60)
Glucose, Bld: 144 mg/dL — ABNORMAL HIGH (ref 70–99)
Potassium: 5.4 mmol/L — ABNORMAL HIGH (ref 3.5–5.1)
Sodium: 139 mmol/L (ref 135–145)

## 2017-09-14 LAB — CBC
HCT: 36.1 % (ref 36.0–46.0)
Hemoglobin: 11.3 g/dL — ABNORMAL LOW (ref 12.0–15.0)
MCH: 29 pg (ref 26.0–34.0)
MCHC: 31.3 g/dL (ref 30.0–36.0)
MCV: 92.8 fL (ref 78.0–100.0)
Platelets: 238 10*3/uL (ref 150–400)
RBC: 3.89 MIL/uL (ref 3.87–5.11)
RDW: 13.6 % (ref 11.5–15.5)
WBC: 9.2 10*3/uL (ref 4.0–10.5)

## 2017-09-14 LAB — I-STAT TROPONIN, ED: Troponin i, poc: 0 ng/mL (ref 0.00–0.08)

## 2017-09-14 LAB — LIPASE, BLOOD: Lipase: 51 U/L (ref 11–51)

## 2017-09-14 LAB — TROPONIN I: Troponin I: <0.03 ng/mL (ref <0.03)

## 2017-09-14 MED ORDER — ENOXAPARIN SODIUM 40 MG/0.4ML ~~LOC~~ SOLN
40.0000 mg | SUBCUTANEOUS | Status: DC
  Administered 2017-09-16: 40 mg via SUBCUTANEOUS
  Filled 2017-09-14 (×2): qty 0.4

## 2017-09-14 MED ORDER — ATORVASTATIN CALCIUM 20 MG PO TABS
20.0000 mg | ORAL_TABLET | Freq: Every day | ORAL | Status: DC
  Administered 2017-09-15 (×2): 20 mg via ORAL
  Filled 2017-09-14 (×2): qty 1

## 2017-09-14 MED ORDER — FAMOTIDINE 20 MG PO TABS
20.0000 mg | ORAL_TABLET | Freq: Every day | ORAL | Status: DC
  Administered 2017-09-15 (×2): 20 mg via ORAL
  Filled 2017-09-14 (×2): qty 1

## 2017-09-14 MED ORDER — LISINOPRIL 10 MG PO TABS
10.0000 mg | ORAL_TABLET | Freq: Every day | ORAL | Status: DC
  Administered 2017-09-15 – 2017-09-16 (×2): 10 mg via ORAL
  Filled 2017-09-14 (×2): qty 1

## 2017-09-14 MED ORDER — ASPIRIN 81 MG PO CHEW
243.0000 mg | CHEWABLE_TABLET | Freq: Once | ORAL | Status: AC
  Administered 2017-09-14: 243 mg via ORAL
  Filled 2017-09-14: qty 3

## 2017-09-14 MED ORDER — GI COCKTAIL ~~LOC~~: 30.0000 mL | Freq: Four times a day (QID) | ORAL | Status: DC | PRN

## 2017-09-14 MED ORDER — ONDANSETRON HCL 4 MG/2ML IJ SOLN: 4.0000 mg | Freq: Four times a day (QID) | INTRAMUSCULAR | Status: DC | PRN

## 2017-09-14 MED ORDER — ACETAMINOPHEN 325 MG PO TABS: 650.0000 mg | ORAL_TABLET | ORAL | Status: DC | PRN

## 2017-09-14 MED ORDER — ASPIRIN EC 81 MG PO TBEC
81.0000 mg | DELAYED_RELEASE_TABLET | Freq: Every day | ORAL | Status: DC
  Administered 2017-09-15 – 2017-09-16 (×2): 81 mg via ORAL
  Filled 2017-09-14 (×2): qty 1

## 2017-09-14 MED ORDER — NITROGLYCERIN 0.4 MG SL SUBL
0.4000 mg | SUBLINGUAL_TABLET | SUBLINGUAL | Status: DC | PRN
  Administered 2017-09-14: 0.4 mg via SUBLINGUAL
  Filled 2017-09-14: qty 1

## 2017-09-14 MED ORDER — ASPIRIN EC 81 MG PO TBEC: 81.0000 mg | DELAYED_RELEASE_TABLET | Freq: Every day | ORAL | Status: DC

## 2017-09-14 NOTE — ED Triage Notes (Signed)
Pt reports CP "for a while" that has worsened over the past few days. Central, non radiating, pressure like, 7/10. Also reports SOB, nausea, dizziness.

## 2017-09-14 NOTE — ED Provider Notes (Signed)
[facility] EMERGENCY DEPARTMENT Provider Note   CSN: [id number] Arrival date & time: 09/14/17  1926     History   Chief Complaint No chief complaint on file.   HPI Jo [name] is a 72 y.o. female.  HPI 72 year old female with history of hypertension, hyperlipidemia, prediabetes, here with chest pain.  The patient was in her usual state of health until sitting down after a movie this afternoon.  She reports immediate onset of a dull, aching, midline substernal chest pressure.  She had associated nausea and shortness of breath.  Per family, she was diaphoretic.  She was clutching her chest.  Symptoms persisted and have improved, but she continues to have a mild dull chest pressure.  She states she has never had pain similar to this.  It is different from her usual reflux and she did not have anything out of the usual for dinner.  She denies any history of heart disease, but has never been formally evaluated.  Pain has improved with nitroglycerin here.  No other relieving factors.  No aggravating factors.  She felt very weak walking into the emergency department, but it did not necessarily worsen her chest pressure.  Past Medical History:  Diagnosis Date  . Anemia   . Goiter   . Hyperlipidemia   . Hypertension   . Menopause age 34  . Reflux   . Spinal stenosis     Patient Active Problem List   Diagnosis Date Noted  . Chest pain 09/14/2017  . Impaired fasting glucose 12/18/2016  . Pre-diabetes 10/25/2014  . Hypertension   . Hyperlipidemia   . Reflux   . Mild anemia     Past Surgical History:  Procedure Laterality Date  . CERVICAL SPINE SURGERY  06/2010     OB History   None      Home Medications    Prior to Admission medications   Medication Sig Start Date End Date Taking? Authorizing Provider  aspirin EC 81 MG tablet Take 81 mg by mouth at bedtime.   Yes [provider]  atorvastatin (LIPITOR) 20 MG tablet Take 1 tablet (20 mg  total) by mouth daily. Patient taking differently: Take 20 mg by mouth at bedtime.  12/18/16  Yes Forrest Moron, MD  cholecalciferol (VITAMIN D) 1000 units tablet Take 1,000 Units by mouth at bedtime.   Yes [provider]  Ferrous Sulfate (IRON SLOW RELEASE PO) Take 1 tablet by mouth daily.   Yes [provider]  HYDROCORTISONE EX Apply 1 application topically daily as needed (itching/bug bites).   Yes [provider]  lisinopril (PRINIVIL,ZESTRIL) 10 MG tablet Take 1 tablet (10 mg total) by mouth daily. Patient taking differently: Take 10 mg by mouth at bedtime.  06/23/17  Yes Stallings, Zoe A, MD  naproxen sodium (ALEVE) 220 MG tablet Take 220 mg by mouth 2 (two) times daily as needed (pain/headache).   Yes [provider]  OVER THE COUNTER MEDICATION Take 1 capsule by mouth at bedtime. Over the counter stool softener   Yes [provider]  polyethylene glycol (MIRALAX / GLYCOLAX) packet Take 17 g by mouth daily as needed (constipation). Mix in 8 oz liquid and drink   Yes [provider]  Probiotic Product (DIGESTIVE ADVANTAGE) CAPS Take 1 capsule by mouth daily.   Yes [provider]  vitamin B-12 (CYANOCOBALAMIN) 1000 MCG tablet Take 1,000 mcg by mouth at bedtime.    Yes [provider]  cyclobenzaprine (  FLEXERIL) 5 MG tablet Take 1-2 tablets (5-10 mg total) by mouth 3 (three) times daily as needed for muscle spasms. Patient not taking: Reported on 06/23/2017 09/02/15   Ivar Drape D, PA  meloxicam (MOBIC) 15 MG tablet Take 0.5-1 tablets (7.5-15 mg total) by mouth daily. Patient not taking: Reported on 06/23/2017 12/18/16   Forrest Moron, MD    Family History Family History  Problem Relation Age of Onset  . Stroke Mother   . Hypertension Mother   . Heart disease Mother   . Heart disease Father   . Hypertension Daughter   . Cancer Sister   . Breast cancer Sister   . Cancer Brother   . Cancer Sister   .  Hypertension Unknown   . Cancer Unknown        lung x 2    Social History Social History   Tobacco Use  . Smoking status: Never Smoker  . Smokeless tobacco: Never Used  Substance Use Topics  . Alcohol use: No    Comment: glass of wine-special occasions  . Drug use: No     Allergies   Demerol   Review of Systems Review of Systems  Constitutional: Positive for fatigue. Negative for chills and fever.  HENT: Negative for congestion, rhinorrhea and sore throat.   Eyes: Negative for visual disturbance.  Respiratory: Positive for shortness of breath. Negative for cough and wheezing.   Cardiovascular: Positive for chest pain. Negative for leg swelling.  Gastrointestinal: Negative for abdominal pain, diarrhea, nausea and vomiting.  Genitourinary: Negative for dysuria, flank pain, vaginal bleeding and vaginal discharge.  Musculoskeletal: Negative for neck pain.  Skin: Negative for rash.  Allergic/Immunologic: Negative for immunocompromised state.  Neurological: Positive for weakness. Negative for syncope and headaches.  Hematological: Does not bruise/bleed easily.  All other systems reviewed and are negative.    Physical Exam Updated Vital Signs BP (!) 164/90   Pulse 68   Temp 98.6 F (37 C) (Oral)   Resp 18   Ht 5\' 6"  (1.676 m)   Wt 83.8 kg Comment: scale a  SpO2 100%   BMI 29.83 kg/m   Physical Exam  Constitutional: She is oriented to person, place, and time. She appears well-developed and well-nourished. No distress.  HENT:  Head: Normocephalic and atraumatic.  Eyes: Conjunctivae are normal.  Neck: Neck supple.  Cardiovascular: Normal rate, regular rhythm and normal heart sounds.  Pulmonary/Chest: Effort normal. No respiratory distress. She has no wheezes.  Abdominal: She exhibits no distension.  Musculoskeletal: She exhibits no edema.  Neurological: She is alert and oriented to person, place, and time. She exhibits normal muscle tone.  Skin: Skin is warm.  Capillary refill takes less than 2 seconds. No rash noted.  Nursing note and vitals reviewed.    ED Treatments / Results  Labs (all labs ordered are listed, but only abnormal results are displayed) Labs Reviewed  BASIC METABOLIC PANEL - Abnormal; Notable for the following components:      Result Value   Potassium 5.4 (*)    Glucose, Bld 144 (*)    Creatinine, Ser 1.20 (*)    GFR calc non Af Amer 44 (*)    GFR calc Af Amer 51 (*)    All other components within normal limits  CBC - Abnormal; Notable for the following components:   Hemoglobin 11.3 (*)    All other components within normal limits  TROPONIN I  HEPATIC FUNCTION PANEL  LIPASE, BLOOD  TROPONIN I  TROPONIN  I  BASIC METABOLIC PANEL  CBC  MAGNESIUM  BRAIN NATRIURETIC PEPTIDE  I-STAT TROPONIN, ED    EKG EKG Interpretation  Date/Time:  Sunday September 14 2017 19:29:04 EDT Ventricular Rate:  80 PR Interval:  162 QRS Duration: 80 QT Interval:  360 QTC Calculation: 415 R Axis:   -17 Text Interpretation:  Normal sinus rhythm Left ventricular hypertrophy Abnormal ECG No significant change since last tracing Confirmed by Ruby Dilone ([id number]) on 09/14/2017 7:59:38 PM   Radiology Dg Chest 2 View  Result Date: 09/14/2017 CLINICAL DATA:  72 year old female with history of chest pain worsening over the past several days. Some associated shortness of breath, nausea and dizziness. EXAM: CHEST - 2 VIEW COMPARISON:  Chest x-ray 03/14/2010. FINDINGS: Diffuse peribronchial cuffing. Lung volumes are normal. No consolidative airspace disease. No pleural effusions. No pneumothorax. No pulmonary nodule or mass noted. Pulmonary vasculature and the cardiomediastinal silhouette are within normal limits. Orthopedic fixation hardware in the lower cervical spine. IMPRESSION: 1. Diffuse peribronchial cuffing, concerning for an acute bronchitis. Electronically Signed   By: Daniel  Entrikin M.D.   On: 09/14/2017 20:26     Procedures Procedures (including critical care time)  Medications Ordered in ED Medications  atorvastatin (LIPITOR) tablet 20 mg (20 mg Oral Given 09/15/17 0020)  lisinopril (PRINIVIL,ZESTRIL) tablet 10 mg (has no administration in time range)  acetaminophen (TYLENOL) tablet 650 mg (has no administration in time range)  ondansetron (ZOFRAN) injection 4 mg (has no administration in time range)  enoxaparin (LOVENOX) injection 40 mg (has no administration in time range)  gi cocktail (Maalox,Lidocaine,Donnatal) (has no administration in time range)  famotidine (PEPCID) tablet 20 mg (20 mg Oral Given 09/15/17 0019)  aspirin EC tablet 81 mg (has no administration in time range)  aspirin chewable tablet 243 mg (243 mg Oral Given 09/14/17 2125)     Initial Impression / Assessment and Plan / ED Course  I have reviewed the triage vital signs and the nursing notes.  Pertinent labs & imaging results that were available during my care of the patient were reviewed by me and considered in my medical decision making (see chart for details).     72  year old female here with fairly concerning history of typical chest pain.  Differential includes esophageal spasm or GI etiology.  Her lab work is reassuring, but pain is resolved with nitroglycerin and her heart score is 4.  Will plan to admit for high risk chest pain observation.  Final Clinical Impressions(s) / ED Diagnoses   Final diagnoses:  Chest pain with high risk for cardiac etiology    ED Discharge Orders    None       Duffy Bruce, MD 09/15/17 [phone]

## 2017-09-14 NOTE — H&P (Signed)
History and Physical  Jo [name] IRW:[id number] DOB: 1945-12-14 DOA: 09/14/2017 1928   PCP: Forrest Moron, MD   HISTORY   Chief Complaint: acute chest pain  HPI: Jo [name] is a 72 y.o. female with HTN, pre-diabetes, hx of reflux, chronic IDA, who presented with acute onset chest pain that occurred after eating dinner and while she was resting (sitting). Described as deep squeezing pressure like pain in the mid epigastric and low-substernal region -- otherwise non radiating. Had associated nausea when she stood up. Family member reported that she seemed to be diaphoretic, although she herself denies this. Occasional trace ankle edema. No PND, dyspnea on exertion, orthopnea, cough or significant edema. She reports + premature CAD family hx in her mother and sisters, although they lived/or still living into their 80-90s. Patient's risk factors include HTN, age, HLD (last lipid panel normal). Pre-diabetic per A1c (6.0) this year. No prior cardiac history. Denies any exertional angina in the past.   Initial EKG showed no ST changes. Received SL nitro and full dose aspirin x 1 in ED. Patient reports chest pain relieved by SL nitro x1. Currently chest pain free.   Review of Systems:  + chest pain (acute, resolved)  - no fevers/chills - no cough - dyspnea on exertion - no edema, PND, orthopnea - no nausea/vomiting; no tarry, melanotic or bloody stools - no dysuria, increased urinary frequency - no weight changes - no neuro deficits  Rest of systems reviewed are negative, except as per above history.   ED course:  Vitals Blood pressure 136/78, pulse 74, temperature 98.1 F (36.7 C), temperature source Oral, resp. rate (!) 22, height 5\' 6"  (1.676 m), weight 83.9 kg, SpO2 100 %. Received asa 243mg  x 1; SL nitro x 1. Initial EKG and troponin negative for ischemic changes.   Past Medical History:  Diagnosis Date  . Anemia   . Goiter   . Hyperlipidemia   . Hypertension     . Menopause age 37  . Reflux   . Spinal stenosis    Past Surgical History:  Procedure Laterality Date  . CERVICAL SPINE SURGERY  06/2010    Social History:  reports that she has never smoked. She has never used smokeless tobacco. She reports that she does not drink alcohol or use drugs.  Allergies  Allergen Reactions  . Demerol Nausea And Vomiting    Severe nausea and vomiting    Family History  Problem Relation Age of Onset  . Stroke Mother   . Hypertension Mother   . Heart disease Mother   . Heart disease Father   . Hypertension Daughter   . Cancer Sister   . Breast cancer Sister   . Cancer Brother   . Cancer Sister   . Hypertension Unknown   . Cancer Unknown        lung x 2      Prior to Admission medications   Medication Sig Start Date End Date Taking? Authorizing Provider  aspirin 81 MG tablet Take 81 mg by mouth daily.    [provider]  atorvastatin (LIPITOR) 20 MG tablet Take 1 tablet (20 mg total) by mouth daily. 12/18/16   Forrest Moron, MD  Cholecalciferol (VITAMIN D3 PO) Take 2,000 Units by mouth daily. Reported on 01/25/2015    [provider]  cyclobenzaprine (FLEXERIL) 5 MG tablet Take 1-2 tablets (5-10 mg total) by mouth 3 (three) times daily as needed for muscle spasms. Patient  not taking: Reported on 06/23/2017 09/02/15   Ivar Drape D, PA  ferrous fumarate (HEMOCYTE - 106 MG FE) 325 (106 FE) MG TABS tablet Take 1 tablet by mouth.    [provider]  lisinopril (PRINIVIL,ZESTRIL) 10 MG tablet Take 1 tablet (10 mg total) by mouth daily. 06/23/17   Forrest Moron, MD  magnesium 30 MG tablet Take 30 mg by mouth 2 (two) times daily. Reported on 01/25/2015    [provider]  meloxicam (MOBIC) 15 MG tablet Take 0.5-1 tablets (7.5-15 mg total) by mouth daily. Patient not taking: Reported on 06/23/2017 12/18/16   Forrest Moron, MD  Multiple Vitamin (MULTIVITAMIN) tablet Take 1 tablet by mouth daily.    [provider]  vitamin B-12 (CYANOCOBALAMIN) 1000 MCG tablet Take 1,000 mcg by mouth daily.    [provider]    PHYSICAL EXAM   Temp:  [98.1 F (36.7 C)] 98.1 F (36.7 C) (09/01 1929) Pulse Rate:  [65-81] 74 (09/01 2146) Resp:  [16-22] 22 (09/01 2146) BP: (97-150)/(78-85) 136/78 (09/01 2146) SpO2:  [100 %] 100 % (09/01 2146) Weight:  [83.9 kg] 83.9 kg (09/01 1931)  BP 136/78   Pulse 74   Temp 98.1 F (36.7 C) (Oral)   Resp (!) 22   Ht 5\' 6"  (1.676 m)   Wt 83.9 kg   SpO2 100%   BMI 29.86 kg/m    GEN well-nourished elderly african-american female; resting comfortably in bed  HEENT NCAT EOM PERRL; clear oropharynx, no cervical LAD; moist mucus membranes  JVP estimated 5 cm H2O above RA; no HJR ; no carotid bruits b/l ;  CV regular normal rate; normal S1 and S2; no m/r/g or S3/S4; PMI non displaced; no parasternal heave  RESP CTA b/l; breathing unlabored and symmetric  ABD soft NT ND +normoactive BS  EXT warm throughout b/l; no peripheral edema b/l  PULSES  DP and radials 2+ intact b/l  SKIN/MSK no rashes or lesions  NEURO/PSYCH AAOx4; no focal deficits   DATA   LABS ON ADMISSION:  Basic Metabolic Panel: Recent Labs  Lab 09/14/17 1958  NA 139  K 5.4*  CL 103  CO2 27  GLUCOSE 144*  BUN 16  CREATININE 1.20*  CALCIUM 9.9   CBC: Recent Labs  Lab 09/14/17 1958  WBC 9.2  HGB 11.3*  HCT 36.1  MCV 92.8  PLT 238   Liver Function Tests: Recent Labs  Lab 09/14/17 2020  AST 19  ALT 15  ALKPHOS 70  BILITOT 0.6  PROT 6.9  ALBUMIN 3.5   Recent Labs  Lab 09/14/17 2020  LIPASE 51   No results for input(s): AMMONIA in the last 168 hours. Coagulation:  Lab Results  Component Value Date   INR 0.93 03/14/2010   No results found for: PTT Lactic Acid, Venous:  No results found for: LATICACIDVEN Cardiac Enzymes: Recent Labs  Lab 09/14/17 2020  TROPONINI <0.03   Urinalysis:    Component Value Date/Time   COLORURINE YELLOW 03/14/2010  1050   APPEARANCEUR CLEAR 03/14/2010 1050   LABSPEC 1.009 03/14/2010 1050   PHURINE 7.0 03/14/2010 1050   HGBUR NEGATIVE 03/14/2010 1050   BILIRUBINUR neg 11/02/2012 [place] 03/14/2010 1050   PROTEINUR trace 11/02/2012 0946   PROTEINUR NEGATIVE 03/14/2010 1050   UROBILINOGEN 0.2 11/02/2012 0946   UROBILINOGEN 0.2 03/14/2010 1050   NITRITE neg 11/02/2012 0946   NITRITE NEGATIVE 03/14/2010 1050   LEUKOCYTESUR Negative 11/02/2012 0946  BNP (last 3 results) No results for input(s): PROBNP in the last 8760 hours. CBG: No results for input(s): GLUCAP in the last 168 hours.  Lab Results  Component Value Date   CHOL 173 12/18/2016   HDL 64 12/18/2016   LDLCALC 98 12/18/2016   TRIG 56 12/18/2016   CHOLHDL 2.7 12/18/2016    Radiological Exams on Admission: Dg Chest 2 View  Result Date: 09/14/2017 CLINICAL DATA:  72 year old female with history of chest pain worsening over the past several days. Some associated shortness of breath, nausea and dizziness. EXAM: CHEST - 2 VIEW COMPARISON:  Chest x-ray 03/14/2010. FINDINGS: Diffuse peribronchial cuffing. Lung volumes are normal. No consolidative airspace disease. No pleural effusions. No pneumothorax. No pulmonary nodule or mass noted. Pulmonary vasculature and the cardiomediastinal silhouette are within normal limits. Orthopedic fixation hardware in the lower cervical spine. IMPRESSION: 1. Diffuse peribronchial cuffing, concerning for an acute bronchitis. Electronically Signed   By: Vinnie Langton M.D.   On: 09/14/2017 20:26    EKG: Independently reviewed. NSR with LVH (unchanged from prior)   ASSESSMENT AND PLAN   Assessment: Jo [name] is a 72 y.o. female with hx of HTN, chronic anemia, reflux who presented with acute onset lower chest pain at rest. Non-exertional although did resolve with SL nitro -- both typical and atypical features. Patient is at moderate risk of CAD given age, family hx of premature  CAD, and HTN/HLD history. Will trend troponins and EKG and consider stress echocardiogram tomorrow. If negative, would recommend patient does outpatient GI workup for reflux. Of note, although radiology read of CXR reported peri-bronchial cuffing, comparison with prior chest XR does not show significant changes. Furthermore, patient has not had cough or URI sx and resp exam was benign. Euvolemic on exam.   Plan:   # Chest pain with atypical and typical features. Likely GI (reflux related) but at least moderate risk for CAD. Negative initial troponin and EKG.  > A1c 6.0; last LDL 98; tchol 173 - trend troponins x 3 and repeat EKG in AM - stress echo ordered tomorrow - recommend outpatient GI workup - telemetry - pepcid trial  - prn GI cocktail - continue home aspirin and atorva for risk reduction  # Hx of HTN - on lisinopril 10mg   - continue lisinopril 10mg   # Borderline hyperkalemia - K 5.4 > stable - continue lisinopril since unchanged but discontinue if K continues to rise  # Cr increase to 1.2 from 1.0 from three months - monitor: if Cr rise tomorrow, consider stopping lisinopril - defer to outpatient PCP  # Chronic iron deficiency anemia > Hb baseline 11 (unchanged) - pt can resume home iron upon discharge - outpatient GI follow up recommended  DVT Prophylaxis: lovenox  Code Status:  Full Code Family Communication: family at bedside Disposition Plan: admit to observation; discharge pending stress echo  Patient contact: Extended Emergency Contact Information Primary Emergency Contact: Jackson,Deloris Address: [address]           [place], [place] [address] [address] [place] Phone: [phone] Mobile Phone: [phone] Relation: Daughter  Time spent: > 35 minutes  Colbert Ewing, MD [facility] Pager [phone]  If 7PM-7AM, please contact night-coverage [email or web address] Password [email or web address] 09/14/2017, 10:18 PM

## 2017-09-15 ENCOUNTER — Other Ambulatory Visit: Payer: Self-pay

## 2017-09-15 ENCOUNTER — Observation Stay (HOSPITAL_COMMUNITY): Payer: Medicare Other

## 2017-09-15 ENCOUNTER — Other Ambulatory Visit (HOSPITAL_COMMUNITY): Payer: Medicare Other

## 2017-09-15 DIAGNOSIS — I129 Hypertensive chronic kidney disease with stage 1 through stage 4 chronic kidney disease, or unspecified chronic kidney disease: Secondary | ICD-10-CM | POA: Diagnosis not present

## 2017-09-15 DIAGNOSIS — N182 Chronic kidney disease, stage 2 (mild): Secondary | ICD-10-CM | POA: Diagnosis not present

## 2017-09-15 DIAGNOSIS — I5030 Unspecified diastolic (congestive) heart failure: Secondary | ICD-10-CM | POA: Diagnosis not present

## 2017-09-15 DIAGNOSIS — R079 Chest pain, unspecified: Secondary | ICD-10-CM | POA: Diagnosis not present

## 2017-09-15 DIAGNOSIS — R0789 Other chest pain: Secondary | ICD-10-CM | POA: Diagnosis not present

## 2017-09-15 LAB — TROPONIN I
Troponin I: <0.03 ng/mL (ref <0.03)
Troponin I: <0.03 ng/mL (ref <0.03)

## 2017-09-15 LAB — BASIC METABOLIC PANEL
Anion gap: 7 (ref 5–15)
BUN: 13 mg/dL (ref 8–23)
CO2: 28 mmol/L (ref 22–32)
Calcium: 9.6 mg/dL (ref 8.9–10.3)
Chloride: 106 mmol/L (ref 98–111)
Creatinine, Ser: 1.16 mg/dL — ABNORMAL HIGH (ref 0.44–1.00)
GFR calc Af Amer: 53 mL/min — ABNORMAL LOW (ref >60)
GFR calc non Af Amer: 46 mL/min — ABNORMAL LOW (ref >60)
Glucose, Bld: 99 mg/dL (ref 70–99)
Potassium: 4.6 mmol/L (ref 3.5–5.1)
Sodium: 141 mmol/L (ref 135–145)

## 2017-09-15 LAB — MAGNESIUM: Magnesium: 1.9 mg/dL (ref 1.7–2.4)

## 2017-09-15 LAB — CBC
HCT: 37.2 % (ref 36.0–46.0)
Hemoglobin: 11.6 g/dL — ABNORMAL LOW (ref 12.0–15.0)
MCH: 28.4 pg (ref 26.0–34.0)
MCHC: 31.2 g/dL (ref 30.0–36.0)
MCV: 91.2 fL (ref 78.0–100.0)
Platelets: 247 10*3/uL (ref 150–400)
RBC: 4.08 MIL/uL (ref 3.87–5.11)
RDW: 13.4 % (ref 11.5–15.5)
WBC: 7.6 10*3/uL (ref 4.0–10.5)

## 2017-09-15 LAB — ECHOCARDIOGRAM COMPLETE
Height: 66 in
Weight: 2918.4 oz

## 2017-09-15 LAB — BRAIN NATRIURETIC PEPTIDE: B Natriuretic Peptide: 50.6 pg/mL (ref 0.0–100.0)

## 2017-09-15 MED ORDER — SODIUM CHLORIDE 0.9 % IV SOLN
INTRAVENOUS | Status: DC
  Administered 2017-09-15: 18:00:00 via INTRAVENOUS

## 2017-09-15 NOTE — Progress Notes (Signed)
*  PRELIMINARY RESULTS* Echocardiogram 2D Echocardiogram has been performed.  Leavy Cella 09/15/2017, 3:12 PM

## 2017-09-15 NOTE — Progress Notes (Signed)
   09/14/17 2326  Vitals  Temp 98.6 F (37 C)  Temp Source Oral  BP (!) 164/90  MAP (mmHg) 110  BP Method Automatic  Patient Position (if appropriate) Sitting  Pulse Rate 68  Pulse Rate Source Monitor  Resp 18  Oxygen Therapy  SpO2 100 %  O2 Device Room Air  Height and Weight  Height 5\' 6"  (1.676 m)  Weight 83.8 kg (scale a)  Type of Scale Used Standing  BSA (Calculated - sq m) 1.98 sq meters  BMI (Calculated) 29.84  Weight in (lb) to have BMI = 25 154.6  Admitted pt to rm 3E05 from ED, pt alert and oriented, denied pain at this time, oriented to room, call bell placed within reach, placed on cardiac monitor, CCMD made aware.

## 2017-09-15 NOTE — Consult Note (Signed)
Referring Physician:  [name] is an 72 y.o. female.                       Chief Complaint: Chest pain  HPI: 72 year old female with PMH of hypertension, pre-diabetes, Anemia, goiter, hyperlipidemia and spinal stenosis had acute onset of chest pain, squeezing type, non-radiating, substernal in location and relieved by one SL NTG and full dose aspirin. Currently patient is chest pain free. EKG showed NSR with possible old lateral infarct and LVH. Troponin I is negative x 3.  Past Medical History:  Diagnosis Date  . Anemia   . Goiter   . Hyperlipidemia   . Hypertension   . Menopause age 55  . Reflux   . Spinal stenosis       Past Surgical History:  Procedure Laterality Date  . CERVICAL SPINE SURGERY  06/2010    Family History  Problem Relation Age of Onset  . Stroke Mother   . Hypertension Mother   . Heart disease Mother   . Heart disease Father   . Hypertension Daughter   . Cancer Sister   . Breast cancer Sister   . Cancer Brother   . Cancer Sister   . Hypertension Unknown   . Cancer Unknown        lung x 2   Social History:  reports that she has never smoked. She has never used smokeless tobacco. She reports that she does not drink alcohol or use drugs.  Allergies:  Allergies  Allergen Reactions  . Demerol Nausea And Vomiting    Severe nausea and vomiting    Medications Prior to Admission  Medication Sig Dispense Refill  . aspirin EC 81 MG tablet Take 81 mg by mouth at bedtime.    Marland Kitchen atorvastatin (LIPITOR) 20 MG tablet Take 1 tablet (20 mg total) by mouth daily. (Patient taking differently: Take 20 mg by mouth at bedtime. ) 90 tablet 3  . cholecalciferol (VITAMIN D) 1000 units tablet Take 1,000 Units by mouth at bedtime.    . Ferrous Sulfate (IRON SLOW RELEASE PO) Take 1 tablet by mouth daily.    Marland Kitchen HYDROCORTISONE EX Apply 1 application topically daily as needed (itching/bug bites).    Marland Kitchen lisinopril (PRINIVIL,ZESTRIL) 10 MG tablet Take 1 tablet (10 mg  total) by mouth daily. (Patient taking differently: Take 10 mg by mouth at bedtime. ) 90 tablet 1  . naproxen sodium (ALEVE) 220 MG tablet Take 220 mg by mouth 2 (two) times daily as needed (pain/headache).    . OVER THE COUNTER MEDICATION Take 1 capsule by mouth at bedtime. Over the counter stool softener    . polyethylene glycol (MIRALAX / GLYCOLAX) packet Take 17 g by mouth daily as needed (constipation). Mix in 8 oz liquid and drink    . Probiotic Product (DIGESTIVE ADVANTAGE) CAPS Take 1 capsule by mouth daily.    . vitamin B-12 (CYANOCOBALAMIN) 1000 MCG tablet Take 1,000 mcg by mouth at bedtime.     . cyclobenzaprine (FLEXERIL) 5 MG tablet Take 1-2 tablets (5-10 mg total) by mouth 3 (three) times daily as needed for muscle spasms. (Patient not taking: Reported on 06/23/2017) 30 tablet 1  . meloxicam (MOBIC) 15 MG tablet Take 0.5-1 tablets (7.5-15 mg total) by mouth daily. (Patient not taking: Reported on 06/23/2017) 30 tablet 1    Results for orders placed or performed during the hospital encounter of 09/14/17 (from the past 48 hour(s))  Basic metabolic  panel     Status: Abnormal   Collection Time: 09/14/17  7:58 PM  Result Value Ref Range   Sodium 139 135 - 145 mmol/L   Potassium 5.4 (H) 3.5 - 5.1 mmol/L   Chloride 103 98 - 111 mmol/L   CO2 27 22 - 32 mmol/L   Glucose, Bld 144 (H) 70 - 99 mg/dL   BUN 16 8 - 23 mg/dL   Creatinine, Ser 1.20 (H) 0.44 - 1.00 mg/dL   Calcium 9.9 8.9 - 10.3 mg/dL   GFR calc non Af Amer 44 (L) >60 mL/min   GFR calc Af Amer 51 (L) >60 mL/min    Comment: (NOTE) The eGFR has been calculated using the CKD EPI equation. This calculation has not been validated in all clinical situations. eGFR's persistently <60 mL/min signify possible Chronic Kidney Disease.    Anion gap 9 5 - 15    Comment: Performed at [facility] [address] [place], [place] [address]  CBC     Status: Abnormal   Collection Time: 09/14/17  7:58 PM  Result Value Ref Range    WBC 9.2 4.0 - 10.5 K/uL   RBC 3.89 3.87 - 5.11 MIL/uL   Hemoglobin 11.3 (L) 12.0 - 15.0 g/dL   HCT 36.1 36.0 - 46.0 %   MCV 92.8 78.0 - 100.0 fL   MCH 29.0 26.0 - 34.0 pg   MCHC 31.3 30.0 - 36.0 g/dL   RDW 13.6 11.5 - 15.5 %   Platelets 238 150 - 400 K/uL    Comment: Performed at [facility] [address], [place], [place] [address]  I-stat troponin, ED     Status: None   Collection Time: 09/14/17  8:03 PM  Result Value Ref Range   Troponin i, poc 0.00 0.00 - 0.08 ng/mL   Comment 3            Comment: Due to the release kinetics of cTnI, a negative result within the first hours of the onset of symptoms does not rule out myocardial infarction with certainty. If myocardial infarction is still suspected, repeat the test at appropriate intervals.   Troponin I     Status: None   Collection Time: 09/14/17  8:20 PM  Result Value Ref Range   Troponin I <0.03 <0.03 ng/mL    Comment: Performed at [facility] [address], [place], [place] [address]  Hepatic function panel     Status: None   Collection Time: 09/14/17  8:20 PM  Result Value Ref Range   Total Protein 6.9 6.5 - 8.1 g/dL   Albumin 3.5 3.5 - 5.0 g/dL   AST 19 15 - 41 U/L   ALT 15 0 - 44 U/L   Alkaline Phosphatase 70 38 - 126 U/L   Total Bilirubin 0.6 0.3 - 1.2 mg/dL   Bilirubin, Direct 0.1 0.0 - 0.2 mg/dL   Indirect Bilirubin 0.5 0.3 - 0.9 mg/dL    Comment: Performed at [facility] [address] [place], [place] [address]  Lipase, blood     Status: None   Collection Time: 09/14/17  8:20 PM  Result Value Ref Range   Lipase 51 11 - 51 U/L    Comment: Performed at [facility] [address], [place], [place] [address]  Brain natriuretic peptide     Status: None   Collection Time: 09/14/17 11:49 PM  Result Value Ref Range   B Natriuretic Peptide  50.6 0.0 - 100.0 pg/mL    Comment: Performed at [facility], [place] [address], [place], [place] [address]  Troponin I-serum (0, 3, 6  hours)     Status: None   Collection Time: 09/15/17  2:36 AM  Result Value Ref Range   Troponin I <0.03 <0.03 ng/mL    Comment: Performed at [facility] [address] [place], [place] [address]  Troponin I-serum (0, 3, 6 hours)     Status: None   Collection Time: 09/15/17  7:58 AM  Result Value Ref Range   Troponin I <0.03 <0.03 ng/mL    Comment: Performed at [facility] [address] [place], [place] [address]  Basic metabolic panel     Status: Abnormal   Collection Time: 09/15/17  7:58 AM  Result Value Ref Range   Sodium 141 135 - 145 mmol/L   Potassium 4.6 3.5 - 5.1 mmol/L   Chloride 106 98 - 111 mmol/L   CO2 28 22 - 32 mmol/L   Glucose, Bld 99 70 - 99 mg/dL   BUN 13 8 - 23 mg/dL   Creatinine, Ser 1.16 (H) 0.44 - 1.00 mg/dL   Calcium 9.6 8.9 - 10.3 mg/dL   GFR calc non Af Amer 46 (L) >60 mL/min   GFR calc Af Amer 53 (L) >60 mL/min    Comment: (NOTE) The eGFR has been calculated using the CKD EPI equation. This calculation has not been validated in all clinical situations. eGFR's persistently <60 mL/min signify possible Chronic Kidney Disease.    Anion gap 7 5 - 15    Comment: Performed at [facility] [address] [place], [place] [address]  CBC     Status: Abnormal   Collection Time: 09/15/17  7:58 AM  Result Value Ref Range   WBC 7.6 4.0 - 10.5 K/uL   RBC 4.08 3.87 - 5.11 MIL/uL   Hemoglobin 11.6 (L) 12.0 - 15.0 g/dL   HCT 37.2 36.0 - 46.0 %   MCV 91.2 78.0 - 100.0 fL   MCH 28.4 26.0 - 34.0 pg   MCHC 31.2 30.0 - 36.0 g/dL   RDW 13.4 11.5 - 15.5 %   Platelets 247 150 - 400 K/uL    Comment: Performed at [facility], [place] [address], [place], [place] [address]  Magnesium     Status: None   Collection Time: 09/15/17  7:58 AM  Result Value Ref Range   Magnesium 1.9 1.7 - 2.4 mg/dL    Comment: Performed at [facility] [address] [place], [place] [address]   Dg Chest 2 View  Result Date: 09/14/2017 CLINICAL DATA:   72 year old female with history of chest pain worsening over the past several days. Some associated shortness of breath, nausea and dizziness. EXAM: CHEST - 2 VIEW COMPARISON:  Chest x-ray 03/14/2010. FINDINGS: Diffuse peribronchial cuffing. Lung volumes are normal. No consolidative airspace disease. No pleural effusions. No pneumothorax. No pulmonary nodule or mass noted. Pulmonary vasculature and the cardiomediastinal silhouette are within normal limits. Orthopedic fixation hardware in the lower cervical spine. IMPRESSION: 1. Diffuse peribronchial cuffing, concerning for an acute bronchitis. Electronically Signed   By: Vinnie Langton M.D.   On: 09/14/2017 20:26    Review Of Systems Constitutional: No fever, chills, weight loss or gain. Eyes: No vision change, wears glasses. No discharge or pain. Ears: No hearing loss, No tinnitus. Respiratory: No asthma, COPD, pneumonias. No shortness of breath.  No hemoptysis. Cardiovascular: Positive chest pain, no palpitation, no leg edema. Gastrointestinal: No nausea, vomiting, diarrhea, constipation. No GI bleed. No hepatitis. Genitourinary: No dysuria, hematuria, kidney stone. No incontinance. Neurological: No headache, stroke, seizures.  Psychiatry: No psych facility admission for anxiety, depression, suicide. No detox. Skin: No rash. Musculoskeletal: Positive joint pain,no  fibromyalgia. No neck pain, positive back pain. Lymphadenopathy: No lymphadenopathy. Hematology: No anemia or easy bruising.   Blood pressure 134/70, pulse 63, temperature (!) 97.5 F (36.4 C), temperature source Oral, resp. rate 18, height '5\' 6"'$  (1.676 m), weight 82.7 kg, SpO2 100 %. Body mass index is 29.44 kg/m. General appearance: alert, cooperative, appears stated age and no distress Head: Normocephalic, atraumatic. Eyes: Brown eyes, pink conjunctiva, corneas clear. PERRL, EOM's intact. Neck: No adenopathy, no carotid bruit, no JVD, supple, symmetrical, trachea midline and  thyroid not enlarged. Resp: Clear to auscultation bilaterally. Cardio: Regular rate and rhythm, S1, S2 normal, II/VI systolic murmur, no click, rub or gallop GI: Soft, non-tender; bowel sounds normal; no organomegaly. Extremities: No edema, cyanosis or clubbing. Skin: Warm and dry.  Neurologic: Alert and oriented X 3, normal strength. Normal coordination and gait.  Assessment/Plan Acute coronary syndrome. Hypertension Hyperlipidemia Mild anemia CKD, II H/O Spinal stenosis  Agree with NM stress test or stress echocardiography. Will do regular echocardiography for LV function. If LV function is good she may undergo NM myocardial perfusion test or TMST in office.  Birdie Riddle, MD  09/15/2017, 1:13 PM

## 2017-09-15 NOTE — Progress Notes (Signed)
Progress Note    Jo [name]  RJJ:[id number] DOB: 11-Aug-1945  DOA: 09/14/2017 PCP: Forrest Moron, MD    Brief Narrative:   Chief complaint: chest pain  Medical records reviewed and are as summarized below:  Jo [name] is an 72 y.o. female with pmh htn, pre-diabetes, reflux, chronic iron def anemia admitted on 09/14/17 with chest pain. Pt reports episode of "squeezing" chest pain in epigastric region. Pain nonradiating without assoicated n/v or diaphoresis though family member stated she was diaphoretic. Pain relieved with SL nitroglycerin in ED. Denies palpitations. No cardiac hx herself, but with family hx in mother and sisters.   Initial ekg NSR without evidence of acute ischemia. Troponin negative x 3. Given holiday, no nuclear medicine available for stress test. Cardiology asked to evaluate pt.   Assessment/Plan:   Principal Problem:   Chest pain  1. Chest pain -Cardiology, Dr Doylene Canard, asked to see pt in consultation -trop neg x 3, 2Decho done today EF 65-70%, no WMA's, grade I diastolic dysfunction -given age and risk factors (HEART score 5) pt was given choice of outpt stress vs cath in am. She has decided to do cath. -NPO after MN -risk stratification - on statin, ASA prior to admit. A1C 6.0% 06/2017  2. Hypertension -reasonable control on ACE  3. Mild renal insufficiency -Cr 1.16<<1.2 (0.97 12/2016) -recheck in am as outpt f/u likely appropriate. Will need f/u BMET after cath. On ACEI  4. Chronic iron def anemia -stable -resume iron at d/c.   Body mass index is 29.44 kg/m.   Family Communication/Anticipated D/C date and plan/Code Status   DVT prophylaxis: ambulate pt Code Status: Full Code.  Family Communication: at bedside Disposition Plan: home when ready   Medical Consultants:    None.   Anti-Infectives:    None  Subjective:   No recurrent chest pain  Objective:    Vitals:   09/15/17 0500 09/15/17 0631 09/15/17 0943  09/15/17 1139  BP:  (!) 141/82 139/74 134/70  Pulse:  65 63 63  Resp:  19  18  Temp:  98.7 F (37.1 C)  (!) 97.5 F (36.4 C)  TempSrc:  Oral  Oral  SpO2:  100%  100%  Weight: 82.7 kg     Height:        Intake/Output Summary (Last 24 hours) at 09/15/2017 1655 Last data filed at 09/15/2017 0700 Gross per 24 hour  Intake -  Output 900 ml  Net -900 ml   Filed Weights   09/14/17 1931 09/14/17 2326 09/15/17 0500  Weight: 83.9 kg 83.8 kg 82.7 kg    Exam: General: awake, alert sitting upright on bed in NAD CV: S1S2 RRR, no m/r/g, no LE edema Resp: CTAB, no w/r/c, no increased wob GI: abdomen soft, NT/ND, BS+ Neuro: no focal m/s deficits on exam Psych: AOx3, pleasant mood and affect  Data Reviewed:   I have personally reviewed following labs and imaging studies:  Labs: Labs show the following:   Basic Metabolic Panel: Recent Labs  Lab 09/14/17 1958 09/15/17 0758  NA 139 141  K 5.4* 4.6  CL 103 106  CO2 27 28  GLUCOSE 144* 99  BUN 16 13  CREATININE 1.20* 1.16*  CALCIUM 9.9 9.6  MG  --  1.9   GFR Estimated Creatinine Clearance: 46.8 mL/min (A) (by C-G formula based on SCr of 1.16 mg/dL (H)). Liver Function Tests: Recent Labs  Lab 09/14/17 2020  AST 19  ALT 15  ALKPHOS 70  BILITOT 0.6  PROT 6.9  ALBUMIN 3.5   Recent Labs  Lab 09/14/17 2020  LIPASE 51   No results for input(s): AMMONIA in the last 168 hours. Coagulation profile No results for input(s): INR, PROTIME in the last 168 hours.  CBC: Recent Labs  Lab 09/14/17 1958 09/15/17 0758  WBC 9.2 7.6  HGB 11.3* 11.6*  HCT 36.1 37.2  MCV 92.8 91.2  PLT 238 247   Cardiac Enzymes: Recent Labs  Lab 09/14/17 2020 09/15/17 0236 09/15/17 0758  TROPONINI <0.03 <0.03 <0.03   BNP (last 3 results) No results for input(s): PROBNP in the last 8760 hours. CBG: No results for input(s): GLUCAP in the last 168 hours. D-Dimer: No results for input(s): DDIMER in the last 72 hours. Hgb A1c: No results  for input(s): HGBA1C in the last 72 hours. Lipid Profile: No results for input(s): CHOL, HDL, LDLCALC, TRIG, CHOLHDL, LDLDIRECT in the last 72 hours. Thyroid function studies: No results for input(s): TSH, T4TOTAL, T3FREE, THYROIDAB in the last 72 hours.  Invalid input(s): FREET3 Anemia work up: No results for input(s): VITAMINB12, FOLATE, FERRITIN, TIBC, IRON, RETICCTPCT in the last 72 hours. Sepsis Labs: Recent Labs  Lab 09/14/17 1958 09/15/17 0758  WBC 9.2 7.6    Microbiology No results found for this or any previous visit (from the past 240 hour(s)).  Procedures and diagnostic studies:  Dg Chest 2 View  Result Date: 09/14/2017 CLINICAL DATA:  73 year old female with history of chest pain worsening over the past several days. Some associated shortness of breath, nausea and dizziness. EXAM: CHEST - 2 VIEW COMPARISON:  Chest x-ray 03/14/2010. FINDINGS: Diffuse peribronchial cuffing. Lung volumes are normal. No consolidative airspace disease. No pleural effusions. No pneumothorax. No pulmonary nodule or mass noted. Pulmonary vasculature and the cardiomediastinal silhouette are within normal limits. Orthopedic fixation hardware in the lower cervical spine. IMPRESSION: 1. Diffuse peribronchial cuffing, concerning for an acute bronchitis. Electronically Signed   By: Vinnie Langton M.D.   On: 09/14/2017 20:26    Medications:   . aspirin EC  81 mg Oral Daily  . atorvastatin  20 mg Oral QHS  . enoxaparin (LOVENOX) injection  40 mg Subcutaneous Q24H  . famotidine  20 mg Oral QHS  . lisinopril  10 mg Oral Daily    Patrici Ranks, NP-C [facility]  pgr [phone]    *Please refer to [email or web address], password [email or web address] to get updated schedule on who will round on this patient, as hospitalists switch teams weekly. If 7PM-7AM, please contact night-coverage at [email or web address], password [email or web address] for any overnight needs.  09/15/2017, 4:55 PM

## 2017-09-16 ENCOUNTER — Encounter (HOSPITAL_COMMUNITY)
Admission: EM | Disposition: A | Discharge status: Home / Self Care | Payer: Self-pay | Source: Home / Self Care | Attending: Emergency Medicine

## 2017-09-16 ENCOUNTER — Encounter (HOSPITAL_COMMUNITY): Payer: Self-pay | Admitting: Cardiovascular Disease

## 2017-09-16 DIAGNOSIS — R079 Chest pain, unspecified: Secondary | ICD-10-CM

## 2017-09-16 HISTORY — PX: LEFT HEART CATH AND CORONARY ANGIOGRAPHY: CATH118249

## 2017-09-16 LAB — BASIC METABOLIC PANEL
Anion gap: 7 (ref 5–15)
BUN: 19 mg/dL (ref 8–23)
CO2: 25 mmol/L (ref 22–32)
Calcium: 9.3 mg/dL (ref 8.9–10.3)
Chloride: 108 mmol/L (ref 98–111)
Creatinine, Ser: 1.12 mg/dL — ABNORMAL HIGH (ref 0.44–1.00)
GFR calc Af Amer: 55 mL/min — ABNORMAL LOW (ref >60)
GFR calc non Af Amer: 48 mL/min — ABNORMAL LOW (ref >60)
Glucose, Bld: 103 mg/dL — ABNORMAL HIGH (ref 70–99)
Potassium: 4.2 mmol/L (ref 3.5–5.1)
Sodium: 140 mmol/L (ref 135–145)

## 2017-09-16 LAB — CBC
HCT: 35.5 % — ABNORMAL LOW (ref 36.0–46.0)
Hemoglobin: 11.2 g/dL — ABNORMAL LOW (ref 12.0–15.0)
MCH: 28.6 pg (ref 26.0–34.0)
MCHC: 31.5 g/dL (ref 30.0–36.0)
MCV: 90.8 fL (ref 78.0–100.0)
Platelets: 226 10*3/uL (ref 150–400)
RBC: 3.91 MIL/uL (ref 3.87–5.11)
RDW: 13.5 % (ref 11.5–15.5)
WBC: 7.7 10*3/uL (ref 4.0–10.5)

## 2017-09-16 LAB — MAGNESIUM: Magnesium: 2.1 mg/dL (ref 1.7–2.4)

## 2017-09-16 SURGERY — LEFT HEART CATH AND CORONARY ANGIOGRAPHY
Anesthesia: LOCAL

## 2017-09-16 MED ORDER — SODIUM CHLORIDE 0.9% FLUSH: 3.0000 mL | Freq: Two times a day (BID) | INTRAVENOUS | Status: DC

## 2017-09-16 MED ORDER — SODIUM CHLORIDE 0.9 % WEIGHT BASED INFUSION
3.0000 mL/kg/h | INTRAVENOUS | Status: AC
  Administered 2017-09-16: 3 mL/kg/h via INTRAVENOUS

## 2017-09-16 MED ORDER — HEPARIN (PORCINE) IN NACL 1000-0.9 UT/500ML-% IV SOLN
INTRAVENOUS | Status: AC
  Filled 2017-09-16: qty 1000

## 2017-09-16 MED ORDER — SODIUM CHLORIDE 0.9% FLUSH: 3.0000 mL | INTRAVENOUS | Status: DC | PRN

## 2017-09-16 MED ORDER — MIDAZOLAM HCL 2 MG/2ML IJ SOLN
INTRAMUSCULAR | Status: AC
  Filled 2017-09-16: qty 2

## 2017-09-16 MED ORDER — FENTANYL CITRATE (PF) 100 MCG/2ML IJ SOLN
INTRAMUSCULAR | Status: DC | PRN
  Administered 2017-09-16: 25 ug via INTRAVENOUS

## 2017-09-16 MED ORDER — MIDAZOLAM HCL 2 MG/2ML IJ SOLN
INTRAMUSCULAR | Status: DC | PRN
  Administered 2017-09-16: 1 mg via INTRAVENOUS

## 2017-09-16 MED ORDER — SODIUM CHLORIDE 0.9 % IV SOLN: 250.0000 mL | INTRAVENOUS | Status: DC | PRN

## 2017-09-16 MED ORDER — IOPAMIDOL (ISOVUE-370) INJECTION 76%
INTRAVENOUS | Status: DC | PRN
  Administered 2017-09-16: 55 mL via INTRA_ARTERIAL

## 2017-09-16 MED ORDER — SODIUM CHLORIDE 0.9 % WEIGHT BASED INFUSION: 1.0000 mL/kg/h | INTRAVENOUS | Status: DC

## 2017-09-16 MED ORDER — FENTANYL CITRATE (PF) 100 MCG/2ML IJ SOLN
INTRAMUSCULAR | Status: AC
  Filled 2017-09-16: qty 2

## 2017-09-16 MED ORDER — SODIUM CHLORIDE 0.9 % IV SOLN: INTRAVENOUS | Status: AC

## 2017-09-16 MED ORDER — IOPAMIDOL (ISOVUE-370) INJECTION 76%
INTRAVENOUS | Status: AC
  Filled 2017-09-16: qty 100

## 2017-09-16 MED ORDER — LIDOCAINE HCL (PF) 1 % IJ SOLN
INTRAMUSCULAR | Status: AC
  Filled 2017-09-16: qty 30

## 2017-09-16 MED ORDER — ASPIRIN 81 MG PO CHEW
81.0000 mg | CHEWABLE_TABLET | ORAL | Status: AC
  Administered 2017-09-16: 81 mg via ORAL
  Filled 2017-09-16: qty 1

## 2017-09-16 MED ORDER — HEPARIN (PORCINE) IN NACL 1000-0.9 UT/500ML-% IV SOLN
INTRAVENOUS | Status: DC | PRN
  Administered 2017-09-16 (×2): 500 mL

## 2017-09-16 MED ORDER — FAMOTIDINE 20 MG PO TABS: 20.0000 mg | ORAL_TABLET | Freq: Every day | ORAL | 0 refills | Status: DC

## 2017-09-16 MED ORDER — LIDOCAINE HCL (PF) 1 % IJ SOLN
INTRAMUSCULAR | Status: DC | PRN
  Administered 2017-09-16: 15 mL

## 2017-09-16 SURGICAL SUPPLY — 11 items
CATH INFINITI 5 FR 3DRC (CATHETERS) ×1 IMPLANT
CATH INFINITI 5FR AL1 (CATHETERS) ×1 IMPLANT
CATH INFINITI 5FR MULTPACK ANG (CATHETERS) ×1 IMPLANT
KIT HEART LEFT (KITS) ×2 IMPLANT
NDL SMART REG 18GX2-3/4 (NEEDLE) IMPLANT
NEEDLE SMART REG 18GX2-3/4 (NEEDLE) ×2 IMPLANT
PACK CARDIAC CATHETERIZATION (CUSTOM PROCEDURE TRAY) ×2 IMPLANT
SHEATH PINNACLE 5F 10CM (SHEATH) ×1 IMPLANT
SYR MEDRAD MARK V 150ML (SYRINGE) ×2 IMPLANT
TRANSDUCER W/STOPCOCK (MISCELLANEOUS) ×2 IMPLANT
WIRE EMERALD 3MM-J .035X150CM (WIRE) ×1 IMPLANT

## 2017-09-16 NOTE — Progress Notes (Signed)
To the best of my knowledge, documentation by Daralene Milch, nursing student at [employer], is correct.

## 2017-09-16 NOTE — Progress Notes (Signed)
Site area: Right  Groin a 5 french arterial sheath was removed by Cyd Silence RN  Site Prior to Removal:  Level 0  Pressure Applied For 20 MINUTES    Minutes Beginning at 0910am  Manual:   Yes.    Patient Status During Pull:  stable  Post Pull Groin Site:  Level 0  Post Pull Instructions Given:  Yes.    Post Pull Pulses Present:  Yes.    Dressing Applied:  Yes.    Comments:  VS remain stable

## 2017-09-16 NOTE — Progress Notes (Signed)
Patient ready for discharge from unit to home. Pt tolerated activity well with no exception.  Tolerated lunch without difficulty. Has made no c/o discomfort.  All d/c instructions reviewed with pt.  Follow up appts reviewed.

## 2017-09-16 NOTE — Discharge Summary (Signed)
Physician Discharge Summary  [name] FIE:[id number] DOB: [birth year] DOA: 09/14/2017  PCP: Forrest Moron, MD  Admit date: 09/14/2017 Discharge date: 09/16/2017  Admitted From: home Discharge disposition: home   Recommendations for Outpatient Follow-Up:   1. Trial of pepcid for possible GI issues causing your chest discomfort 2. If continues may need referral to gastroenterology for an upper endoscopy 3. BMP 1 week   Discharge Diagnosis:   Principal Problem:   Chest pain    Discharge Condition: Improved.  Diet recommendation: Low sodium, heart healthy  Wound care: None.  Code status: Full.   History of Present Illness:   Jo [name] is a 72 y.o. female with HTN, pre-diabetes, hx of reflux, chronic IDA, who presented with acute onset chest pain that occurred after eating dinner and while she was resting (sitting). Described as deep squeezing pressure like pain in the mid epigastric and low-substernal region -- otherwise non radiating. Had associated nausea when she stood up. Family member reported that she seemed to be diaphoretic, although she herself denies this. Occasional trace ankle edema. No PND, dyspnea on exertion, orthopnea, cough or significant edema. She reports + premature CAD family hx in her mother and sisters, although they lived/or still living into their 80-90s. Patient's risk factors include HTN, age, HLD (last lipid panel normal). Pre-diabetic per A1c (6.0) this year. No prior cardiac history. Denies any exertional angina in the past.    Hospital Course by Problem:   1. Chest pain -Cardiology, Dr Doylene Canard, asked to see pt in consultation -trop neg x 3, 2Decho done today EF 65-70%, no WMA's, grade I diastolic dysfunction -given age and risk factors (HEART score 5) pt was given choice of outpt stress vs cath in am. She has decided to do cath. -cath reassuring  2. Hypertension -reasonable control on ACE  3. Mild renal  insufficiency -Cr 1.16<<1.2 (0.97 12/2016) -BMP in 1 wek  4. Chronic iron def anemia -stable -resume iron     Medical Consultants:   cards   Discharge Exam:   Vitals:   09/16/17 0900 09/16/17 0930  BP: (!) 149/76 140/80  Pulse: 64 67  Resp: 16   Temp:    SpO2: 100%    Vitals:   09/16/17 0850 09/16/17 0855 09/16/17 0900 09/16/17 0930  BP: (!) 156/71 (!) 147/73 (!) 149/76 140/80  Pulse: 68 63 64 67  Resp: 17 (!) 24 16   Temp:      TempSrc:      SpO2: 100% 100% 100%   Weight:      Height:        General exam: Appears calm and comfortable.    The results of significant diagnostics from this hospitalization (including imaging, microbiology, ancillary and laboratory) are listed below for reference.     Procedures and Diagnostic Studies:   Dg Chest 2 View  Result Date: 09/14/2017 CLINICAL DATA:  72 year old female with history of chest pain worsening over the past several days. Some associated shortness of breath, nausea and dizziness. EXAM: CHEST - 2 VIEW COMPARISON:  Chest x-ray 03/14/2010. FINDINGS: Diffuse peribronchial cuffing. Lung volumes are normal. No consolidative airspace disease. No pleural effusions. No pneumothorax. No pulmonary nodule or mass noted. Pulmonary vasculature and the cardiomediastinal silhouette are within normal limits. Orthopedic fixation hardware in the lower cervical spine. IMPRESSION: 1. Diffuse peribronchial cuffing, concerning for an acute bronchitis. Electronically Signed   By: Vinnie Langton M.D.   On: 09/14/2017 20:26  Labs:   Basic Metabolic Panel: Recent Labs  Lab 09/14/17 1958 09/15/17 0758 09/16/17 0614  NA 139 141 140  K 5.4* 4.6 4.2  CL 103 106 108  CO2 27 28 25   GLUCOSE 144* 99 103*  BUN 16 13 19   CREATININE 1.20* 1.16* 1.12*  CALCIUM 9.9 9.6 9.3  MG  --  1.9 2.1   GFR Estimated Creatinine Clearance: 48.4 mL/min (A) (by C-G formula based on SCr of 1.12 mg/dL (H)). Liver Function Tests: Recent Labs  Lab  09/14/17 2020  AST 19  ALT 15  ALKPHOS 70  BILITOT 0.6  PROT 6.9  ALBUMIN 3.5   Recent Labs  Lab 09/14/17 2020  LIPASE 51   No results for input(s): AMMONIA in the last 168 hours. Coagulation profile No results for input(s): INR, PROTIME in the last 168 hours.  CBC: Recent Labs  Lab 09/14/17 1958 09/15/17 0758 09/16/17 0614  WBC 9.2 7.6 7.7  HGB 11.3* 11.6* 11.2*  HCT 36.1 37.2 35.5*  MCV 92.8 91.2 90.8  PLT 238 247 226   Cardiac Enzymes: Recent Labs  Lab 09/14/17 2020 09/15/17 0236 09/15/17 0758  TROPONINI <0.03 <0.03 <0.03   BNP: Invalid input(s): POCBNP CBG: No results for input(s): GLUCAP in the last 168 hours. D-Dimer No results for input(s): DDIMER in the last 72 hours. Hgb A1c No results for input(s): HGBA1C in the last 72 hours. Lipid Profile No results for input(s): CHOL, HDL, LDLCALC, TRIG, CHOLHDL, LDLDIRECT in the last 72 hours. Thyroid function studies No results for input(s): TSH, T4TOTAL, T3FREE, THYROIDAB in the last 72 hours.  Invalid input(s): FREET3 Anemia work up No results for input(s): VITAMINB12, FOLATE, FERRITIN, TIBC, IRON, RETICCTPCT in the last 72 hours. Microbiology No results found for this or any previous visit (from the past 240 hour(s)).   Discharge Instructions:   Discharge Instructions    Diet - low sodium heart healthy   Complete by:  As directed    Discharge instructions   Complete by:  As directed    Trial of pepcid for possible GI issues causing your chest discomfort If continues may need referral to gastroenterology for an upper endoscopy   Increase activity slowly   Complete by:  As directed      Allergies as of 09/16/2017      Reactions   Demerol Nausea And Vomiting   Severe nausea and vomiting      Medication List    STOP taking these medications   cyclobenzaprine 5 MG tablet Commonly known as:  FLEXERIL   meloxicam 15 MG tablet Commonly known as:  MOBIC     TAKE these medications   aspirin  EC 81 MG tablet Take 81 mg by mouth at bedtime.   atorvastatin 20 MG tablet Commonly known as:  LIPITOR Take 1 tablet (20 mg total) by mouth daily. What changed:  when to take this   cholecalciferol 1000 units tablet Commonly known as:  VITAMIN D Take 1,000 Units by mouth at bedtime.   DIGESTIVE ADVANTAGE Caps Take 1 capsule by mouth daily.   famotidine 20 MG tablet Commonly known as:  PEPCID Take 1 tablet (20 mg total) by mouth at bedtime.   HYDROCORTISONE EX Apply 1 application topically daily as needed (itching/bug bites).   IRON SLOW RELEASE PO Take 1 tablet by mouth daily.   lisinopril 10 MG tablet Commonly known as:  PRINIVIL,ZESTRIL Take 1 tablet (10 mg total) by mouth daily. What changed:  when to take this  naproxen sodium 220 MG tablet Commonly known as:  ALEVE Take 220 mg by mouth 2 (two) times daily as needed (pain/headache).   OVER THE COUNTER MEDICATION Take 1 capsule by mouth at bedtime. Over the counter stool softener   polyethylene glycol packet Commonly known as:  MIRALAX / GLYCOLAX Take 17 g by mouth daily as needed (constipation). Mix in 8 oz liquid and drink   vitamin B-12 1000 MCG tablet Commonly known as:  CYANOCOBALAMIN Take 1,000 mcg by mouth at bedtime.      Follow-up Information    Forrest Moron, MD Follow up in 1 week(s).   Specialty:  Internal Medicine Contact information: [place] [address] [phone]            Time coordinating discharge: 25 min  Signed:  Geradine Girt  [facility] 09/16/2017, 10:20 AM

## 2017-09-16 NOTE — Progress Notes (Signed)
Patient rec'd back from cath lab via hospital bed. Pt alert and oriented. Right groin with dry gauze dsg in place. Level 0  V/S being obtained and wnl at this time. Pt voices no c/o. Call bell at side. Will continue to monitor.

## 2017-09-16 NOTE — Consult Note (Signed)
Ref: Forrest Moron, MD   Subjective:  Patient agrees to cardiac cath after discussion with family members and attending physician.  Objective:  Vital Signs in the last 24 hours: Temp:  [97.5 F (36.4 C)-98.4 F (36.9 C)] 98.4 F (36.9 C) (09/03 0536) Pulse Rate:  [63-74] 65 (09/03 0536) Cardiac Rhythm: Normal sinus rhythm (09/03 0700) Resp:  [18] 18 (09/03 0536) BP: (105-150)/(70-79) 150/78 (09/03 0536) SpO2:  [100 %] 100 % (09/03 0536) Weight:  [82.4 kg] 82.4 kg (09/03 0536)  Physical Exam: BP Readings from Last 1 Encounters:  09/16/17 (!) 150/78     Wt Readings from Last 1 Encounters:  09/16/17 82.4 kg    Weight change: -1.542 kg Body mass index is 29.31 kg/m. HEENT: /AT, Eyes-Brown, PERL, EOMI, Conjunctiva-Pink, Sclera-Non-icteric Neck: No JVD, No bruit, Trachea midline. Lungs:  Clear, Bilateral. Cardiac:  Regular rhythm, normal S1 and S2, no S3. II/VI systolic murmur. Abdomen:  Soft, non-tender. BS present. Extremities:  No edema present. No cyanosis. No clubbing. CNS: AxOx3, Cranial nerves grossly intact, moves all 4 extremities.  Skin: Warm and dry.   Intake/Output from previous day: 09/02 0701 - 09/03 0700 In: 690 [P.O.:240; I.V.:450] Out: 2200 [Urine:2200]    Lab Results: BMET    Component Value Date/Time   NA 141 09/15/2017 0758   NA 139 09/14/2017 1958   NA 141 06/23/2017 0902   NA 142 12/18/2016 0937   NA 139 12/13/2015 1102   K 4.6 09/15/2017 0758   K 5.4 (H) 09/14/2017 1958   K 5.2 06/23/2017 0902   CL 106 09/15/2017 0758   CL 103 09/14/2017 1958   CL 105 06/23/2017 0902   CO2 28 09/15/2017 0758   CO2 27 09/14/2017 1958   CO2 24 06/23/2017 0902   GLUCOSE 99 09/15/2017 0758   GLUCOSE 144 (H) 09/14/2017 1958   GLUCOSE 94 06/23/2017 0902   GLUCOSE 84 12/18/2016 0937   GLUCOSE 83 12/13/2015 1102   BUN 13 09/15/2017 0758   BUN 16 09/14/2017 1958   BUN 15 06/23/2017 0902   BUN 18 12/18/2016 0937   BUN 16 12/13/2015 1102   CREATININE  1.16 (H) 09/15/2017 0758   CREATININE 1.20 (H) 09/14/2017 1958   CREATININE 1.05 (H) 06/23/2017 0902   CREATININE 0.89 12/13/2015 1102   CREATININE 0.93 10/24/2014 0924   CREATININE 0.97 12/02/2013 1912   CALCIUM 9.6 09/15/2017 0758   CALCIUM 9.9 09/14/2017 1958   CALCIUM 10.0 06/23/2017 0902   GFRNONAA 46 (L) 09/15/2017 0758   GFRNONAA 44 (L) 09/14/2017 1958   GFRNONAA 53 (L) 06/23/2017 0902   GFRAA 53 (L) 09/15/2017 0758   GFRAA 51 (L) 09/14/2017 1958   GFRAA 61 06/23/2017 0902   CBC    Component Value Date/Time   WBC 7.7 09/16/2017 0614   RBC 3.91 09/16/2017 0614   HGB 11.2 (L) 09/16/2017 0614   HGB 11.0 (L) 06/23/2017 0902   HCT 35.5 (L) 09/16/2017 0614   HCT 34.6 06/23/2017 0902   PLT 226 09/16/2017 0614   PLT 276 06/23/2017 0902   MCV 90.8 09/16/2017 0614   MCV 89 06/23/2017 0902   MCH 28.6 09/16/2017 0614   MCHC 31.5 09/16/2017 0614   RDW 13.5 09/16/2017 0614   RDW 14.7 06/23/2017 0902   HEPATIC Function Panel Recent Labs    12/18/16 0937 06/23/17 0902 09/14/17 2020  PROT 6.9 6.8 6.9   HEMOGLOBIN A1C No components found for: HGA1C,  MPG CARDIAC ENZYMES Lab Results  Component Value Date  TROPONINI <0.03 09/15/2017   TROPONINI <0.03 09/15/2017   TROPONINI <0.03 09/14/2017   BNP No results for input(s): PROBNP in the last 8760 hours. TSH No results for input(s): TSH in the last 8760 hours. CHOLESTEROL Recent Labs    12/18/16 0937  CHOL 173    Scheduled Meds: . [MAR Hold] aspirin EC  81 mg Oral Daily  . [MAR Hold] atorvastatin  20 mg Oral QHS  . [MAR Hold] enoxaparin (LOVENOX) injection  40 mg Subcutaneous Q24H  . [MAR Hold] famotidine  20 mg Oral QHS  . [MAR Hold] lisinopril  10 mg Oral Daily  . sodium chloride flush  3 mL Intravenous Q12H   Continuous Infusions: . sodium chloride 50 mL/hr at 09/15/17 1825  . sodium chloride    . sodium chloride 3 mL/kg/hr (09/16/17 0708)   Followed by  . sodium chloride     PRN Meds:.sodium chloride,  [MAR Hold] acetaminophen, [MAR Hold] gi cocktail, [MAR Hold] ondansetron (ZOFRAN) IV, sodium chloride flush  Assessment/Plan: Acute coronary syndrome HTN Hyperlipidemia CKD, II H/O spinal stenosis  Cardiac cath today. Patient understood risks and alternatives.   LOS: 0 days    Dixie Dials  MD  09/16/2017, 7:32 AM

## 2017-09-25 ENCOUNTER — Ambulatory Visit: Payer: Medicare Other | Admitting: Family Medicine

## 2017-10-16 ENCOUNTER — Encounter

## 2017-10-16 ENCOUNTER — Ambulatory Visit (INDEPENDENT_AMBULATORY_CARE_PROVIDER_SITE_OTHER): Payer: Medicare Other | Admitting: Family Medicine

## 2017-10-16 ENCOUNTER — Encounter: Payer: Self-pay | Admitting: Family Medicine

## 2017-10-16 ENCOUNTER — Other Ambulatory Visit: Payer: Self-pay

## 2017-10-16 VITALS — BP 137/80 | HR 67 | Temp 98.0°F | Resp 16 | Ht 66.0 in | Wt 184.6 lb

## 2017-10-16 DIAGNOSIS — Z09 Encounter for follow-up examination after completed treatment for conditions other than malignant neoplasm: Secondary | ICD-10-CM

## 2017-10-16 DIAGNOSIS — Z23 Encounter for immunization: Secondary | ICD-10-CM | POA: Diagnosis not present

## 2017-10-16 DIAGNOSIS — I10 Essential (primary) hypertension: Secondary | ICD-10-CM

## 2017-10-16 NOTE — Progress Notes (Signed)
Chief Complaint  Patient presents with  . Hospitalization Follow-up    chest pain     HPI  Pt reports that she is exercising and changing her diet She states that she is walking every day She states that she does not get chest pain with exercise She states that each week she adds a little more to her exercise routine  Wt Readings from Last 3 Encounters:  10/16/17 184 lb 9.6 oz (83.7 kg)  09/16/17 181 lb 9.6 oz (82.4 kg)  06/23/17 185 lb 3.2 oz (84 kg)    She is eating a very healthy diet She states that she was always eating well She states that she stopped red meat She reports that she eats chicken and fish    Past Medical History:  Diagnosis Date  . Anemia   . Goiter   . Hyperlipidemia   . Hypertension   . Menopause age 11  . Reflux   . Spinal stenosis     Current Outpatient Medications  Medication Sig Dispense Refill  . aspirin EC 81 MG tablet Take 81 mg by mouth at bedtime.    Marland Kitchen atorvastatin (LIPITOR) 20 MG tablet Take 1 tablet (20 mg total) by mouth daily. (Patient taking differently: Take 20 mg by mouth at bedtime. ) 90 tablet 3  . cholecalciferol (VITAMIN D) 1000 units tablet Take 1,000 Units by mouth at bedtime.    . famotidine (PEPCID) 20 MG tablet Take 1 tablet (20 mg total) by mouth at bedtime. 30 tablet 0  . Ferrous Sulfate (IRON SLOW RELEASE PO) Take 1 tablet by mouth daily.    Marland Kitchen HYDROCORTISONE EX Apply 1 application topically daily as needed (itching/bug bites).    Marland Kitchen lisinopril (PRINIVIL,ZESTRIL) 10 MG tablet Take 1 tablet (10 mg total) by mouth daily. (Patient taking differently: Take 10 mg by mouth at bedtime. ) 90 tablet 1  . naproxen sodium (ALEVE) 220 MG tablet Take 220 mg by mouth 2 (two) times daily as needed (pain/headache).    . OVER THE COUNTER MEDICATION Take 1 capsule by mouth at bedtime. Over the counter stool softener    . polyethylene glycol (MIRALAX / GLYCOLAX) packet Take 17 g by mouth daily as needed (constipation). Mix in 8 oz liquid  and drink    . Probiotic Product (DIGESTIVE ADVANTAGE) CAPS Take 1 capsule by mouth daily.    . vitamin B-12 (CYANOCOBALAMIN) 1000 MCG tablet Take 1,000 mcg by mouth at bedtime.      No current facility-administered medications for this visit.     Allergies:  Allergies  Allergen Reactions  . Demerol Nausea And Vomiting    Severe nausea and vomiting    Past Surgical History:  Procedure Laterality Date  . CERVICAL SPINE SURGERY  06/2010  . LEFT HEART CATH AND CORONARY ANGIOGRAPHY N/A 09/16/2017   Procedure: LEFT HEART CATH AND CORONARY ANGIOGRAPHY;  Surgeon: Dixie Dials, MD;  Location: [facility];  Service: Cardiovascular;  Laterality: N/A;    Social History   Socioeconomic History  . Marital status: Widowed    Spouse name: Not on file  . Number of children: 3  . Years of education: college  . Highest education level: Not on file  Occupational History  . 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[employer]  . Financial resource strain: Not on file  . Food insecurity:    Worry: Not on file    Inability: Not on file  . Transportation needs:  Medical: Not on file    Non-medical: Not on file  Tobacco Use  . Smoking status: Never Smoker  . Smokeless tobacco: Never Used  Substance and Sexual Activity  . Alcohol use: No    Comment: glass of wine-special occasions  . Drug use: No  . Sexual activity: Never    Birth control/protection: Abstinence  Lifestyle  . Physical activity:    Days per week: Not on file    Minutes per session: Not on file  . Stress: Not on file  Relationships  . Social connections:    Talks on phone: Not on file    Gets together: Not on file    Attends religious service: Not on file    Active member of club or organization: Not on file    Attends meetings of clubs or organizations: Not on file    Relationship status: Not on file  Other Topics Concern  . Not on file  Social History Narrative  . Not on file    Family History   Problem Relation Age of Onset  . Stroke Mother   . Hypertension Mother   . Heart disease Mother   . Heart disease Father   . Hypertension Daughter   . Cancer Sister   . Breast cancer Sister   . Cancer Brother   . Cancer Sister   . Hypertension Unknown   . Cancer Unknown        lung x 2     ROS Review of Systems See HPI Constitution: No fevers or chills No malaise No diaphoresis Skin: No rash or itching Eyes: no blurry vision, no double vision GU: no dysuria or hematuria Neuro: no dizziness or headaches all others reviewed and negative   Objective: Vitals:   10/16/17 1056  BP: 137/80  Pulse: 67  Resp: 16  Temp: 98 F (36.7 C)  TempSrc: Oral  SpO2: 99%  Weight: 184 lb 9.6 oz (83.7 kg)  Height: 5\' 6"  (1.676 m)    Physical Exam  Constitutional: She is oriented to person, place, and time. She appears well-developed and well-nourished.  HENT:  Head: Normocephalic and atraumatic.  Eyes: Conjunctivae and EOM are normal.  Cardiovascular: Normal rate, regular rhythm and normal heart sounds.  No murmur heard. Pulmonary/Chest: Effort normal and breath sounds normal. No stridor. No respiratory distress.  Musculoskeletal: Normal range of motion. She exhibits no edema.  Neurological: She is alert and oriented to person, place, and time.  Skin: Skin is warm. Capillary refill takes less than 2 seconds.  Psychiatric: She has a normal mood and affect. Her behavior is normal. Judgment and thought content normal.      Jo [name]  CARDIAC CATHETERIZATION  Order# [id number]  Reading physician: Dixie Dials, MD Ordering physician: Dixie Dials, MD Study date: 09/16/17  Physicians   Panel Physicians Referring Physician Case Authorizing Physician  Dixie Dials, MD (Primary)    Procedures   LEFT HEART CATH AND CORONARY ANGIOGRAPHY  Conclusion   Medical treatment.  No indication for antiplatelet therapy at this time.  Indications   Precordial pain [R07.2  (ICD-10-CM)]  Procedural Details/Technique   Technical Details PROCEDURE: Left heart catheterization with selective coronary angiography.  CLINICAL HISTORY: This is a 72 year old femalehad typical and recurrent angina.  The risks, benefits, and details of the procedure were explained to the patient. The patient verbalized understanding and wanted to proceed. Informed written consent was obtained.  PROCEDURE TECHNIQUE: The patient was approached from the right femoral  artery using a 5 French short sheath. Left coronary angiography was done using a Judkins L4 guide catheter. Right coronary angiography was done using a AL1 guide catheter. Left ventriculography was not done using a pigtail catheter.   CONTRAST: Total of 55 cc.  Total sedation time : -39 mins. I was present 100 % of the time face to face during the procedure.   Estimated blood loss <50 mL.  During this procedure the patient was administered the following to achieve and maintain moderate conscious sedation: Versed 1 mg, Fentanyl 25 mcg, while the patient's heart rate, blood pressure, and oxygen saturation were continuously monitored. The period of conscious sedation was 39 minutes, of which I was present face-to-face 100% of this time.  Complications   Complications documented before study signed (09/16/2017 8:36 AM EDT)    No complications were associated with this study.  Documented by Dixie Dials, MD - 09/16/2017 8:35 AM EDT    Coronary Findings   Diagnostic  Dominance: Right  Left Main  Vessel was injected. Vessel is normal in caliber. Vessel is angiographically normal.  Left Anterior Descending  Vessel was injected. Vessel is normal in caliber. Vessel is angiographically normal.  Ramus Intermedius  Vessel was not visualized.  Left Circumflex  Vessel was injected. Vessel is normal in caliber. Vessel is angiographically normal.  Right Coronary Artery  Vessel was injected. Vessel is normal in caliber. Vessel is  angiographically normal.  Intervention   No interventions have been documented.  Wall Motion   Resting       by echocardiogram, EF 65-70 % and LVH.          Coronary Diagrams   Diagnostic Diagram       Implants    No implant documentation for this case.     Assessment and Plan Keasha was seen today for hospitalization follow-up.  Diagnoses and all orders for this visit:  Essential hypertension  Hospital discharge follow-up  Patient without ischemia and normal EF She was reassured she could resume cardio exercise She should continue a healthy diet Continue current meds   Flu vaccine given   [place]

## 2017-10-16 NOTE — Patient Instructions (Addendum)
   If you have lab work done today you will be contacted with your lab results within the next 2 weeks.  If you have not heard from us then please contact us. The fastest way to get your results is to register for My Chart.   IF you received an x-ray today, you will receive an invoice from [facility]. Please contact [facility] at [phone] with questions or concerns regarding your invoice.   IF you received labwork today, you will receive an invoice from [facility]. Please contact [facility] at [phone] with questions or concerns regarding your invoice.   Our billing staff will not be able to assist you with questions regarding bills from these companies.  You will be contacted with the lab results as soon as they are available. The fastest way to get your results is to activate your My Chart account. Instructions are located on the last page of this paperwork. If you have not heard from us regarding the results in 2 weeks, please contact this office.      Heart Disease Prevention Heart disease is a leading cause of death. There are many things you can do to help prevent heart disease. Be physically active Physical activity is good for your heart. It helps control your blood pressure, cholesterol levels, and weight. Try to be physically active every day. Ask your health care provider what activities are best for you. Be a healthy weight Extra weight can strain your heart and affect your blood pressure and cholesterol levels. Lose weight with diet and exercise if recommended by your health care provider. Eat heart-healthy foods Follow a healthy eating plan as recommended by your health care provider or dietitian. Heart-healthy foods include:  High-fiber foods. These include oat bran, oatmeal, and whole-grain breads and cereals.  Fruits and vegetables.  Avoid:  Alcohol.  Fried foods.  Foods high in saturated fat. These include meats, butter, whole dairy  products, shortening, and coconut or palm oil.  Salty foods. These include canned food, luncheon meat, salty snacks, and fast food.  Keep your cholesterol levels under control Cholesterol is a substance that is used for many important functions. When your cholesterol levels are high, cholesterol can stick to the insides of your blood vessels, making them narrow or clog. This can lead to chest pain (angina) and a heart attack. Keep your cholesterol levels under control as recommended by your health care provider. Have your cholesterol checked at least once a year. Target cholesterol levels (in mg/dL) for most people are:  Total cholesterol below 200.  LDL cholesterol below 100.  HDL cholesterol above 40 in men and above 50 in women.  Triglycerides below 150.  Keep your blood pressure under control Having high blood pressure (hypertension) puts you at risk for stroke and other forms of heart disease. Keep your blood pressure under control as recommended by your health care provider. Ask your health care provider if you need treatment to lower your blood pressure. If you are 18-39 years of age, have your blood pressure checked every 3-5 years. If you are 40 years of age or older, have your blood pressure checked every year. Do not use tobacco products Tobacco smoke can damage your heart and blood vessels. Do not use any tobacco products including cigarettes, chewing tobacco, or electronic cigarettes. If you need help quitting, ask your health care provider. Take medicines as directed Take medicines only as directed by your health care provider. Ask your health care provider whether you   should take an aspirin every day. Taking aspirin can help reduce your risk of heart disease and stroke. Where to find more information: To find out more about heart disease, visit the American Heart Association's website at www.americanheart.org This information is not intended to replace advice given to you by  your health care provider. Make sure you discuss any questions you have with your health care provider. Document Released: 08/15/2003 Document Revised: 05/31/2015 Document Reviewed: 02/24/2013 Elsevier Interactive Patient Education  2018 Elsevier Inc.  

## 2018-01-09 ENCOUNTER — Other Ambulatory Visit: Payer: Self-pay | Admitting: Family Medicine

## 2018-01-09 DIAGNOSIS — Z1231 Encounter for screening mammogram for malignant neoplasm of breast: Secondary | ICD-10-CM

## 2018-02-12 ENCOUNTER — Ambulatory Visit
Admission: RE | Admit: 2018-02-12 | Discharge: 2018-02-12 | Disposition: A | Discharge status: Home / Self Care | Payer: Medicare Other | Source: Ambulatory Visit | Attending: Family Medicine | Admitting: Family Medicine

## 2018-02-12 DIAGNOSIS — Z1231 Encounter for screening mammogram for malignant neoplasm of breast: Secondary | ICD-10-CM

## 2018-03-12 ENCOUNTER — Other Ambulatory Visit: Payer: Self-pay | Admitting: Family Medicine

## 2018-03-12 DIAGNOSIS — I10 Essential (primary) hypertension: Secondary | ICD-10-CM

## 2018-03-12 DIAGNOSIS — E785 Hyperlipidemia, unspecified: Secondary | ICD-10-CM

## 2018-04-16 ENCOUNTER — Telehealth (INDEPENDENT_AMBULATORY_CARE_PROVIDER_SITE_OTHER): Payer: Medicare Other | Admitting: Family Medicine

## 2018-04-16 ENCOUNTER — Other Ambulatory Visit: Payer: Self-pay

## 2018-04-16 DIAGNOSIS — I10 Essential (primary) hypertension: Secondary | ICD-10-CM

## 2018-04-16 DIAGNOSIS — D649 Anemia, unspecified: Secondary | ICD-10-CM | POA: Diagnosis not present

## 2018-04-16 DIAGNOSIS — R7303 Prediabetes: Secondary | ICD-10-CM

## 2018-04-16 DIAGNOSIS — E782 Mixed hyperlipidemia: Secondary | ICD-10-CM

## 2018-04-16 DIAGNOSIS — Z1211 Encounter for screening for malignant neoplasm of colon: Secondary | ICD-10-CM

## 2018-04-16 DIAGNOSIS — Z13820 Encounter for screening for osteoporosis: Secondary | ICD-10-CM

## 2018-04-16 NOTE — Progress Notes (Signed)
Telemedicine Encounter- SOAP NOTE Established Patient  This telephone encounter was conducted with the patient's (or proxy's) verbal consent via audio telecommunications: yes/no: Yes Patient was instructed to have this encounter in a suitably private space; and to only have persons present to whom they give permission to participate. In addition, patient identity was confirmed by use of name plus two identifiers (DOB and address).  I discussed the limitations, risks, security and privacy concerns of performing an evaluation and management service by telephone and the availability of in person appointments. I also discussed with the patient that there may be a patient responsible charge related to this service. The patient expressed understanding and agreed to proceed.  I spent a total of TIME; 0 MIN TO 60 MIN: 15 minutes talking with the patient or their proxy.  CC:  Med refill, hypertension  Subjective   Jo [name] is a 73 y.o. established patient. Telephone visit today for  HPI  Hypertension: Patient here for follow-up of elevated blood pressure. She is exercising and is adherent to low salt diet.  Blood pressure is well controlled at home. Cardiac symptoms none. Patient denies chest pain, claudication, exertional chest pressure/discomfort, irregular heart beat, lower extremity edema and near-syncope.  Cardiovascular risk factors: dyslipidemia and hypertension. Use of agents associated with hypertension: none. History of target organ damage: none.  Patient reports that she had a left heart cath and coronary angiography 09/16/2017 She denies any chest pains, palpitations or shortness of breath  BP Readings from Last 3 Encounters:  10/16/17 137/80  09/16/17 139/71  06/23/17 138/80   Colon Cancer Screening She denies blood in his stool, unexpected weight loss or pain with defecation No rectal itching She does not smoke She does not have a family history of colon cancer     Patient Active Problem List   Diagnosis Date Noted  . Chest pain 09/14/2017  . Impaired fasting glucose 12/18/2016  . Pre-diabetes 10/25/2014  . Hypertension   . Hyperlipidemia   . Reflux   . Mild anemia     Past Medical History:  Diagnosis Date  . Anemia   . Goiter   . Hyperlipidemia   . Hypertension   . Menopause age 28  . Reflux   . Spinal stenosis     Current Outpatient Medications  Medication Sig Dispense Refill  . aspirin EC 81 MG tablet Take 81 mg by mouth at bedtime.    Marland Kitchen atorvastatin (LIPITOR) 20 MG tablet Take 1 tablet by mouth once daily 90 tablet 0  . cholecalciferol (VITAMIN D) 1000 units tablet Take 5,000 Units by mouth at bedtime.     . Ferrous Sulfate (IRON SLOW RELEASE PO) Take 1 tablet by mouth daily.    Marland Kitchen lisinopril (PRINIVIL,ZESTRIL) 10 MG tablet Take 1 tablet by mouth once daily 90 tablet 0  . naproxen sodium (ALEVE) 220 MG tablet Take 220 mg by mouth 2 (two) times daily as needed (pain/headache).    . OVER THE COUNTER MEDICATION Take 1 capsule by mouth at bedtime. Over the counter stool softener    . polyethylene glycol (MIRALAX / GLYCOLAX) packet Take 17 g by mouth daily as needed (constipation). Mix in 8 oz liquid and drink    . Probiotic Product (DIGESTIVE ADVANTAGE) CAPS Take 1 capsule by mouth daily.    . vitamin B-12 (CYANOCOBALAMIN) 1000 MCG tablet Take 1,000 mcg by mouth at bedtime.     . famotidine (PEPCID) 20 MG tablet Take 1 tablet (20 mg  total) by mouth at bedtime. (Patient not taking: Reported on 04/16/2018) 30 tablet 0  . HYDROCORTISONE EX Apply 1 application topically daily as needed (itching/bug bites).     No current facility-administered medications for this visit.     Allergies  Allergen Reactions  . Demerol Nausea And Vomiting    Severe nausea and vomiting    Social History   Socioeconomic History  . Marital status: Widowed    Spouse name: Not on file  . Number of children: 3  . Years of education: college  . Highest  education level: Not on file  Occupational History  . 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 Financial resource strain: Not on file  . Food insecurity:    Worry: Not on file    Inability: Not on file  . Transportation needs:    Medical: Not on file    Non-medical: Not on file  Tobacco Use  . Smoking status: Never Smoker  . Smokeless tobacco: Never Used  Substance and Sexual Activity  . Alcohol use: No    Comment: glass of wine-special occasions  . Drug use: No  . Sexual activity: Never    Birth control/protection: Abstinence  Lifestyle  . Physical activity:    Days per week: Not on file    Minutes per session: Not on file  . Stress: Not on file  Relationships  . Social connections:    Talks on phone: Not on file    Gets together: Not on file    Attends religious service: Not on file    Active member of club or organization: Not on file    Attends meetings of clubs or organizations: Not on file    Relationship status: Not on file  . Intimate partner violence:    Fear of current or ex partner: Not on file    Emotionally abused: Not on file    Physically abused: Not on file    Forced sexual activity: Not on file  Other Topics Concern  . Not on file  Social History Narrative  . Not on file    ROS Review of Systems  Constitutional: Negative for activity change, appetite change, chills and fever.  HENT: Negative for congestion, nosebleeds, trouble swallowing and voice change.   Respiratory: Negative for cough, shortness of breath and wheezing.   Gastrointestinal: Negative for diarrhea, nausea and vomiting.  Genitourinary: Negative for difficulty urinating, dysuria, flank pain and hematuria.  Musculoskeletal: Negative for back pain, joint swelling and neck pain.  Neurological: Negative for dizziness, speech difficulty, light-headedness and numbness.  See HPI. All other review of systems negative.   Objective   Vitals as reported by the  patient: There were no vitals filed for this visit.  Diagnoses and all orders for this visit:  Screening for osteoporosis  Screening for colon cancer -     Cologuard  Essential hypertension- bp stable -     CMP14+EGFR  Mild anemia -     CBC  Pre-diabetes-  Discussed screening for diabetes  -     Hemoglobin A1c  Mixed hyperlipidemia- discussed cholesterol goals -     Lipid panel; Future     I discussed the assessment and treatment plan with the patient. The patient was provided an opportunity to ask questions and all were answered. The patient agreed with the plan and demonstrated an understanding of the instructions.   The patient was advised to call back or seek an in-person  evaluation if the symptoms worsen or if the condition fails to improve as anticipated.  I provided 15 minutes of non-face-to-face time during this encounter.  Forrest Moron, MD  Primary Care at [facility]

## 2018-04-17 LAB — CMP14+EGFR
ALT: 10 IU/L (ref 0–32)
AST: 19 IU/L (ref 0–40)
Albumin/Globulin Ratio: 1.2 (ref 1.2–2.2)
Albumin: 3.7 g/dL (ref 3.7–4.7)
Alkaline Phosphatase: 76 IU/L (ref 39–117)
BUN/Creatinine Ratio: 18 (ref 12–28)
BUN: 19 mg/dL (ref 8–27)
Bilirubin Total: 0.4 mg/dL (ref 0.0–1.2)
CO2: 23 mmol/L (ref 20–29)
Calcium: 9.8 mg/dL (ref 8.7–10.3)
Chloride: 102 mmol/L (ref 96–106)
Creatinine, Ser: 1.05 mg/dL — ABNORMAL HIGH (ref 0.57–1.00)
GFR calc Af Amer: 60 mL/min/{1.73_m2} (ref >59)
GFR calc non Af Amer: 52 mL/min/{1.73_m2} — ABNORMAL LOW (ref >59)
Globulin, Total: 3 g/dL (ref 1.5–4.5)
Glucose: 92 mg/dL (ref 65–99)
Potassium: 4.6 mmol/L (ref 3.5–5.2)
Sodium: 140 mmol/L (ref 134–144)
Total Protein: 6.7 g/dL (ref 6.0–8.5)

## 2018-04-17 LAB — CBC
Hematocrit: 32.8 % — ABNORMAL LOW (ref 34.0–46.6)
Hemoglobin: 11.1 g/dL (ref 11.1–15.9)
MCH: 29.1 pg (ref 26.6–33.0)
MCHC: 33.8 g/dL (ref 31.5–35.7)
MCV: 86 fL (ref 79–97)
Platelets: 258 10*3/uL (ref 150–450)
RBC: 3.82 x10E6/uL (ref 3.77–5.28)
RDW: 13.5 % (ref 11.7–15.4)
WBC: 8.1 10*3/uL (ref 3.4–10.8)

## 2018-04-17 LAB — HEMOGLOBIN A1C
Est. average glucose Bld gHb Est-mCnc: 123 mg/dL
Hgb A1c MFr Bld: 5.9 % — ABNORMAL HIGH (ref 4.8–5.6)

## 2018-05-04 LAB — COLOGUARD: Cologuard: POSITIVE — AB

## 2018-05-06 ENCOUNTER — Telehealth: Payer: Self-pay | Admitting: Family Medicine

## 2018-05-06 DIAGNOSIS — R195 Other fecal abnormalities: Secondary | ICD-10-CM

## 2018-05-06 NOTE — Telephone Encounter (Signed)
See result note. Please notify the patient of positive cologuard. Let her know that she will need a colonoscopy. I have placed a referral for this and they will set that up I the next few months.

## 2018-05-06 NOTE — Telephone Encounter (Signed)
Copied from [place] [id number]. Topic: General - Inquiry >> May 06, 2018 10:22 AM Alanda Slim E wrote: Reason for CRM: Pt request a call from Dr. Nolon Rod about her colonoscopy results and an appt made for the Pt. Pt needs information on why she was scheduled with another office for a test/ please advise

## 2018-05-07 NOTE — Telephone Encounter (Signed)
Called and spoke with pt about her cologuard results and about her referral to [facility] for a colonoscopy. Pt verbalize understanding.

## 2018-05-11 ENCOUNTER — Other Ambulatory Visit: Payer: Self-pay

## 2018-05-11 ENCOUNTER — Encounter: Payer: Self-pay | Admitting: Gastroenterology

## 2018-05-11 ENCOUNTER — Ambulatory Visit (INDEPENDENT_AMBULATORY_CARE_PROVIDER_SITE_OTHER): Payer: Medicare Other | Admitting: Gastroenterology

## 2018-05-11 VITALS — Wt 179.0 lb

## 2018-05-11 DIAGNOSIS — K5900 Constipation, unspecified: Secondary | ICD-10-CM | POA: Diagnosis not present

## 2018-05-11 DIAGNOSIS — R195 Other fecal abnormalities: Secondary | ICD-10-CM

## 2018-05-11 NOTE — Progress Notes (Signed)
TELEHEALTH VISIT  Referring Provider: Forrest Moron, MD Primary Care Physician:  Forrest Moron, MD   Tele-visit due to COVID-19 pandemic Patient requested visit virtually, consented to the virtual encounter via telephone enabled telemedicine application Contact made at: 9:45 05/11/18 Patient verified by name and date of birth Location of patient: Home Location provider: [facility] Names of persons participating: Me, patient, Tinnie Gens CMA Time spent on telehealth visit:  25 minutes I discussed the limitations of evaluation and management by telemedicine. The patient expressed understanding and agreed to proceed.  Reason for Consultation:  + Cologuard   IMPRESSION:  + Cologuard without known overt or occult GI blood loss On iron supplements, ? Diagnosis of iron deficiency Constipation on Miralax and probiotics No known family history of colon cancer or polyps Two prior colonoscopies, the last >10 years ago  PLAN: Will ask Dr. Nolon Rod to confirm if she was diagnosed with iron deficiency prior to starting iron supplements last year Colonoscopy to evaluate the + cologuard when coronavirus restrictions have been lifted Consider concurrent EGD if iron deficiency has been identified Continue Miralax and probiotics for constipation Call with any questions or concerns prior to endoscopy  I consented the patient discussing the risks, benefits, and alternatives to endoscopic evaluation. In particular, we discussed the risks that include, but are not limited to, reaction to medication, cardiopulmonary compromise, bleeding requiring blood transfusion, aspiration resulting in pneumonia, perforation requiring surgery, lack of diagnosis, severe illness requiring hospitalization, and even death. We reviewed the risk of missed lesion including polyps or even cancer. The patient acknowledges these risks and asks that we proceed.  HPI: Jo [name] is a 73 y.o. female  referred by Dr. Nolon Rod for a + cologuard. The history is obtained through the patient and review of her electronic health record.   Routine testing showed + cologuard.  Prior colonoscopy with Dr. Wynetta Emery x 2, last over 10 years.  No difficulties with prior endoscopy. No personal history of colon polyps.     No overt GI blood loss. No melena, hematochezia, bright red blood per rectum.  Feels like her food digests slowly. Some progression in her constipation. No diarrhea. Using Miralax and probiotics. Would like to avoid daily laxatives.  Currently having one hard BM daily to QOD. Intermittent associated straining.   Has taken iron supplements for the last year. She has had ongoing fatigue.   04/16/18: hgb 11.1, MCV 86, RDW 13.5, platelets 258 Her lowest hemoglobin was 11.0 on 06/23/17. MCV and RDW have been normal. No recent iron and ferritin. She is not aware of a diagnosis of iron deficiency.   Brother may have had pancreatitis associated with alcohol. No known family history of colon cancer or polyps. No family history of uterine/endometrial cancer, pancreatic cancer or gastric/stomach cancer.  Past Medical History:  Diagnosis Date  . Anemia   . Goiter   . Hyperlipidemia   . Hypertension   . Menopause age 89  . Reflux   . Spinal stenosis     Past Surgical History:  Procedure Laterality Date  . CERVICAL SPINE SURGERY  06/2010  . LEFT HEART CATH AND CORONARY ANGIOGRAPHY N/A 09/16/2017   Procedure: LEFT HEART CATH AND CORONARY ANGIOGRAPHY;  Surgeon: Dixie Dials, MD;  Location: [place] [facility];  Service: Cardiovascular;  Laterality: N/A;    Current Outpatient Medications  Medication Sig Dispense Refill  . aspirin EC 81 MG tablet Take 81 mg by mouth at bedtime.    Marland Kitchen  atorvastatin (LIPITOR) 20 MG tablet Take 1 tablet by mouth once daily 90 tablet 0  . cholecalciferol (VITAMIN D) 1000 units tablet Take 5,000 Units by mouth at bedtime.     . Ferrous Sulfate (IRON SLOW RELEASE PO)  Take 1 tablet by mouth daily.    Marland Kitchen HYDROCORTISONE EX Apply 1 application topically daily as needed (itching/bug bites).    Marland Kitchen lisinopril (PRINIVIL,ZESTRIL) 10 MG tablet Take 1 tablet by mouth once daily 90 tablet 0  . naproxen sodium (ALEVE) 220 MG tablet Take 220 mg by mouth 2 (two) times daily as needed (pain/headache).    . OVER THE COUNTER MEDICATION Take 1 capsule by mouth at bedtime. Over the counter stool softener    . polyethylene glycol (MIRALAX / GLYCOLAX) packet Take 17 g by mouth daily as needed (constipation). Mix in 8 oz liquid and drink    . Probiotic Product (DIGESTIVE ADVANTAGE) CAPS Take 1 capsule by mouth daily.    . vitamin B-12 (CYANOCOBALAMIN) 1000 MCG tablet Take 1,000 mcg by mouth at bedtime.      No current facility-administered medications for this visit.     Allergies as of 05/11/2018 - Review Complete 05/11/2018  Allergen Reaction Noted  . Demerol Nausea And Vomiting 02/23/2011    Family History  Problem Relation Age of Onset  . Stroke Mother   . Hypertension Mother   . Heart disease Mother   . Heart disease Father   . Hypertension Daughter   . Cancer Sister   . Breast cancer Sister   . Cancer Brother   . Cancer Sister   . Hypertension Other   . Cancer Other        lung x 2    Social History   Socioeconomic History  . Marital status: Widowed    Spouse name: Not on file  . Number of children: 3  . Years of education: college  . Highest education level: Not on file  Occupational History  . 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[employer]  . Financial resource strain: Not on file  . Food insecurity:    Worry: Not on file    Inability: Not on file  . Transportation needs:    Medical: Not on file    Non-medical: Not on file  Tobacco Use  . Smoking status: Never Smoker  . Smokeless tobacco: Never Used  Substance and Sexual Activity  . Alcohol use: No    Comment: glass of wine-special occasions  . Drug use: No  . Sexual  activity: Never    Birth control/protection: Abstinence  Lifestyle  . Physical activity:    Days per week: Not on file    Minutes per session: Not on file  . Stress: Not on file  Relationships  . Social connections:    Talks on phone: Not on file    Gets together: Not on file    Attends religious service: Not on file    Active member of club or organization: Not on file    Attends meetings of clubs or organizations: Not on file    Relationship status: Not on file  . Intimate partner violence:    Fear of current or ex partner: Not on file    Emotionally abused: Not on file    Physically abused: Not on file    Forced sexual activity: Not on file  Other Topics Concern  . Not on file  Social History Narrative  . Not on file  Review of Systems: ALL ROS discussed and all others negative except listed in HPI.  Physical Exam: General: in no acute distress Neuro: Alert and appropriate Psych: Normal affect and normal insight Exam otherwise limited due to telehealth encounter.   Myleigh Amara L. Tarri Glenn, MD, MPH [facility] 05/11/2018, 9:53 AM

## 2018-05-11 NOTE — Patient Instructions (Addendum)
I have recommended a colonoscopy for further evaluation of your + cologuard. We will call to schedule this as soon as as the coronavirus restrictions have been lifted.   I will review your iron supplements with Dr. Nolon Rod.   Please continue to take Miralax and probiotics.  Please call with any questions or concerns prior to your endoscopy.   Thank you for your patience with me and our technology today! Please stay home, safe, and healthy. I look forward to meeting you in person in the future.

## 2018-06-11 ENCOUNTER — Other Ambulatory Visit: Payer: Self-pay | Admitting: Family Medicine

## 2018-06-11 DIAGNOSIS — E785 Hyperlipidemia, unspecified: Secondary | ICD-10-CM

## 2018-06-11 DIAGNOSIS — I10 Essential (primary) hypertension: Secondary | ICD-10-CM

## 2018-06-16 ENCOUNTER — Other Ambulatory Visit: Payer: Self-pay | Admitting: Family Medicine

## 2018-06-16 DIAGNOSIS — E785 Hyperlipidemia, unspecified: Secondary | ICD-10-CM

## 2018-06-16 DIAGNOSIS — I10 Essential (primary) hypertension: Secondary | ICD-10-CM

## 2018-06-16 NOTE — Telephone Encounter (Signed)
Requested medication (s) are due for refill today:yes  Requested medication (s) are on the active medication list: yes  Last refill:  03/12/2018  Future visit scheduled:no  Notes to clinic: Labs are out of date    Requested Prescriptions  Pending Prescriptions Disp Refills   atorvastatin (LIPITOR) 20 MG tablet [Pharmacy Med Name: Atorvastatin Calcium 20 MG Oral Tablet] 90 tablet 0    Sig: Take 1 tablet by mouth once daily     Cardiovascular:  Antilipid - Statins Failed - 06/16/2018  1:13 PM      Failed - Total Cholesterol in normal range and within 360 days    Cholesterol, Total  Date Value Ref Range Status  12/18/2016 173 100 - 199 mg/dL Final         Failed - LDL in normal range and within 360 days    LDL Calculated  Date Value Ref Range Status  12/18/2016 98 0 - 99 mg/dL Final         Failed - HDL in normal range and within 360 days    HDL  Date Value Ref Range Status  12/18/2016 64 >39 mg/dL Final         Failed - Triglycerides in normal range and within 360 days    Triglycerides  Date Value Ref Range Status  12/18/2016 56 0 - 149 mg/dL Final         Passed - Patient is not pregnant      Passed - Valid encounter within last 12 months    Recent Outpatient Visits          8 months ago Essential hypertension   Primary Care at [facility], Arlie Solomons, MD   11 months ago Essential hypertension   Primary Care at [facility], Arlie Solomons, MD   1 year ago Mild anemia   Primary Care at [facility], Arlie Solomons, MD   1 year ago Breast pain, left   Primary Care at [facility], [place] A, MD   2 years ago Encounter for Medicare annual wellness exam   Primary Care at [facility], [place] A, MD            lisinopril (ZESTRIL) 10 MG tablet [Pharmacy Med Name: Lisinopril 10 MG Oral Tablet] 90 tablet 0    Sig: Take 1 tablet by mouth once daily     Cardiovascular:  ACE Inhibitors Failed - 06/16/2018  1:13 PM      Failed - Cr in normal range and within 180 days   Creat  Date Value Ref Range Status  12/13/2015 0.89 0.60 - 0.93 mg/dL Final    Comment:      For patients > or = 74 years of age: The upper reference limit for Creatinine is approximately 13% higher for people identified as African-American.      Creatinine, Ser  Date Value Ref Range Status  04/16/2018 1.05 (H) 0.57 - 1.00 mg/dL Final         Failed - Valid encounter within last 6 months    Recent Outpatient Visits          8 months ago Essential hypertension   Primary Care at [place], MD   11 months ago Essential hypertension   Primary Care at [facility], Arlie Solomons, MD   1 year ago Mild anemia   Primary Care at [facility], Arlie Solomons, MD   1 year ago Breast pain, left   Primary Care at [facility], [place],  MD   2 years ago Encounter for Medicare annual wellness exam   Primary Care at [facility], [place], MD             Passed - K in normal range and within 180 days    Potassium  Date Value Ref Range Status  04/16/2018 4.6 3.5 - 5.2 mmol/L Final         Passed - Patient is not pregnant      Passed - Last BP in normal range    BP Readings from Last 1 Encounters:  10/16/17 137/80

## 2018-09-23 ENCOUNTER — Other Ambulatory Visit: Payer: Self-pay | Admitting: Family Medicine

## 2018-09-23 DIAGNOSIS — E785 Hyperlipidemia, unspecified: Secondary | ICD-10-CM

## 2018-09-23 DIAGNOSIS — I10 Essential (primary) hypertension: Secondary | ICD-10-CM

## 2018-09-23 NOTE — Telephone Encounter (Signed)
Left voicemail for patient to call Primary Care at [facility] to schedule a follow up visit.  30 day courtesy refill sent.

## 2018-09-23 NOTE — Telephone Encounter (Signed)
Requested Prescriptions  Pending Prescriptions Disp Refills  . lisinopril (ZESTRIL) 10 MG tablet [Pharmacy Med Name: Lisinopril 10 MG Oral Tablet] 30 tablet 0    Sig: Take 1 tablet by mouth once daily     Cardiovascular:  ACE Inhibitors Failed - 09/23/2018  5:06 PM      Failed - Cr in normal range and within 180 days    Creat  Date Value Ref Range Status  12/13/2015 0.89 0.60 - 0.93 mg/dL Final    Comment:      For patients > or = 74 years of age: The upper reference limit for Creatinine is approximately 13% higher for people identified as African-American.      Creatinine, Ser  Date Value Ref Range Status  04/16/2018 1.05 (H) 0.57 - 1.00 mg/dL Final         Passed - K in normal range and within 180 days    Potassium  Date Value Ref Range Status  04/16/2018 4.6 3.5 - 5.2 mmol/L Final         Passed - Patient is not pregnant      Passed - Last BP in normal range    BP Readings from Last 1 Encounters:  10/16/17 137/80         Passed - Valid encounter within last 6 months    Recent Outpatient Visits          5 months ago Essential hypertension   Primary Care at [facility], Arlie Solomons, MD   11 months ago Essential hypertension   Primary Care at [facility], [place], MD   1 year ago Essential hypertension   Primary Care at [facility], [place], MD   1 year ago Mild anemia   Primary Care at [facility], [place], MD   1 year ago Breast pain, left   Primary Care at [facility], [place] A, MD             . atorvastatin (LIPITOR) 20 MG tablet [Pharmacy Med Name: Atorvastatin Calcium 20 MG Oral Tablet] 30 tablet 0    Sig: Take 1 tablet by mouth once daily     Cardiovascular:  Antilipid - Statins Failed - 09/23/2018  5:06 PM      Failed - Total Cholesterol in normal range and within 360 days    Cholesterol, Total  Date Value Ref Range Status  12/18/2016 173 100 - 199 mg/dL Final         Failed - LDL in normal range and within 360 days    LDL Calculated   Date Value Ref Range Status  12/18/2016 98 0 - 99 mg/dL Final         Failed - HDL in normal range and within 360 days    HDL  Date Value Ref Range Status  12/18/2016 64 >39 mg/dL Final         Failed - Triglycerides in normal range and within 360 days    Triglycerides  Date Value Ref Range Status  12/18/2016 56 0 - 149 mg/dL Final         Passed - Patient is not pregnant      Passed - Valid encounter within last 12 months    Recent Outpatient Visits          5 months ago Essential hypertension   Primary Care at [facility], Arlie Solomons, MD   11 months ago Essential hypertension   Primary Care at [facility], Arlie Solomons, MD  1 year ago Essential hypertension   Primary Care at [facility], Arlie Solomons, MD   1 year ago Mild anemia   Primary Care at [facility], Arlie Solomons, MD   1 year ago Breast pain, left   Primary Care at [facility], Arlie Solomons, MD

## 2018-10-21 ENCOUNTER — Ambulatory Visit (INDEPENDENT_AMBULATORY_CARE_PROVIDER_SITE_OTHER): Payer: Medicare Other | Admitting: Family Medicine

## 2018-10-21 ENCOUNTER — Other Ambulatory Visit: Payer: Self-pay

## 2018-10-21 ENCOUNTER — Encounter: Payer: Self-pay | Admitting: Family Medicine

## 2018-10-21 VITALS — BP 139/73 | HR 65 | Temp 98.4°F | Resp 17 | Ht 66.0 in | Wt 192.8 lb

## 2018-10-21 DIAGNOSIS — R7303 Prediabetes: Secondary | ICD-10-CM

## 2018-10-21 DIAGNOSIS — M791 Myalgia, unspecified site: Secondary | ICD-10-CM

## 2018-10-21 DIAGNOSIS — Z13820 Encounter for screening for osteoporosis: Secondary | ICD-10-CM | POA: Diagnosis not present

## 2018-10-21 DIAGNOSIS — I10 Essential (primary) hypertension: Secondary | ICD-10-CM

## 2018-10-21 DIAGNOSIS — E782 Mixed hyperlipidemia: Secondary | ICD-10-CM

## 2018-10-21 DIAGNOSIS — E2839 Other primary ovarian failure: Secondary | ICD-10-CM

## 2018-10-21 DIAGNOSIS — Z23 Encounter for immunization: Secondary | ICD-10-CM | POA: Diagnosis not present

## 2018-10-21 DIAGNOSIS — R195 Other fecal abnormalities: Secondary | ICD-10-CM

## 2018-10-21 NOTE — Patient Instructions (Addendum)
Please call [facility] for appointment for positive Cologuard:  [place], [place]: [phone] Fax: [phone]   Stop the atorvastatin.  Please let me know if your symptoms improve regarding the calf pain.   If you have lab work done today you will be contacted with your lab results within the next 2 weeks.  If you have not heard from [place] then please contact us. The fastest way to get your results is to register for My Chart.   IF you received an x-ray today, you will receive an invoice from [facility]. Please contact [facility] at [phone] with questions or concerns regarding your invoice.   IF you received labwork today, you will receive an invoice from [facility]. Please contact [facility] at [phone] with questions or concerns regarding your invoice.   Our billing staff will not be able to assist you with questions regarding bills from these companies.  You will be contacted with the lab results as soon as they are available. The fastest way to get your results is to activate your My Chart account. Instructions are located on the last page of this paperwork. If you have not heard from [place] regarding the results in 2 weeks, please contact this office.     Statin Intolerance Statin intolerance is the inability to take a certain type of cholesterol-lowering medicine (statin) because of unwanted side effects, such as muscle pain. Statins may be prescribed to improve cholesterol levels and lower the risk for heart attack, heart disease, and stroke. People with statin intolerance may experience muscle pain and cramps (myalgia) that go away when the statin is stopped. What are the causes? The cause of this condition is not known. What increases the risk? You may be at higher risk for statin intolerance if you:  Take more than one type of cholesterol-lowering medicine at a time.  Need a higher than normal dosage of a  statin.  Have a history of high CK (creatine kinase) in the blood. CK is an enzyme that is released when muscle tissue is damaged.  Are a woman.  Have a body size that is smaller than normal.  Are age 58 or older.  Drink a lot of alcohol.  Are of Asian descent.  Have kidney, liver, or muscle disease.  Have a low level of hormones that control how your body uses energy (hypothyroidism).  Take certain medicines, including: ? Certain medicines for mental illness (antipsychotics). ? Some types of antibiotics. ? Certain medicines used for blood pressure or heart disease. ? Medicines that reduce the activity of the body's disease-fighting system (immunosuppressants). ? Medicines to treat hepatitis C and HIV/AIDS (human immunodeficiency virus/acquired immunodeficiency syndrome).  Follow an intense exercise program.  Drink a lot of grapefruit juice.  Have a lack of vitamin D (deficiency). What are the signs or symptoms? Signs and symptoms of statin intolerance include:  General muscle aches (myalgia). This may feel similar to muscle aches that are caused by the flu.  Muscle pain, tenderness, cramps, or weakness (myositis).  Severe muscle pain, weakness, and raised blood CK levels (rhabdomyolysis). Symptoms usually go away when the statin is stopped. Rarely, liver damage can also occur, which can cause:  Loss of appetite.  Pain in the upper right abdomen.  Yellowing of the skin or the white parts of the eyes (jaundice). How is this diagnosed? This condition is diagnosed based on your symptoms, your medical history, and a physical exam. You may also have  blood tests. How is this treated? Your health care provider may have you stop taking the statin for a short time to see if your symptoms go away. Then your provider may restart your statin, or:  Change you to a different statin.  Lower the dosage of your statin.  Have you take your statin less often.  Change you to  another type of cholesterol-lowering medicine.  Stop or change any medicines that might be interfering with your statin.  Limit how much grapefruit juice you drink.  Recommend stopping intense exercise. Follow these instructions at home: Medicines  Take your statin medicine as told by your health care provider. Do not stop taking the statin unless your health care provider tells you to stop.  Take other over-the-counter and prescription medicines only as told by your health care provider.  Check with your health care provider before taking any new medicines. Certain medicines can increase your risk for statin intolerance. Lifestyle  Limit alcohol intake to no more than 1 drink a day for nonpregnant women and 2 drinks a day for men. One drink equals 12 oz of beer, 5 oz of wine, or 1 oz of hard liquor.  Do not use any products that contain nicotine or tobacco, such as cigarettes and e-cigarettes. If you need help quitting, ask your health care provider. General instructions   Have blood tests to check CK levels or liver enzymes as told by your health care provider.  Exercise as directed. Ask your health care provider what exercises are best for you. Do not start a new exercise program before talking about it with your health care provider.  Follow instructions from your health care provider about eating or drinking restrictions. Your health care provider may recommend: ? Limiting the amount of grapefruit juice you drink, or not drinking it at all. ? Eating a diet that is low in saturated fats and high in fiber.  Maintain a healthy weight with diet and exercise.  Keep all follow-up visits as told by your health care provider. This is important. Contact a health care provider if:  You have any symptoms of statin intolerance. Summary  Statins are important medicines for improving your cholesterol, which may reduce your risk for heart attack and stroke.  Some people are not able  to continue taking a particular statin because of muscle problems (myalgia) or other side effects.  Myalgia is the most common symptom of statin intolerance. Often, the muscle pain and cramps from myalgia go away when the statin is stopped.  Although rare, liver damage can occur as a result of statin intolerance. You should have routine blood tests to check your liver enzymes.  In most cases, statin intolerance can be managed and you can continue to take a cholesterol-lowering medicine. This information is not intended to replace advice given to you by your health care provider. Make sure you discuss any questions you have with your health care provider. Document Released: 06/13/2016 Document Revised: 12/13/2016 Document Reviewed: 06/13/2016 Elsevier Patient Education  2020 [employer].

## 2018-10-21 NOTE — Progress Notes (Addendum)
Established Patient Office Visit  Subjective:  [name], female    DOB: 01/08/1946  Age: 73 y.o. MRN: [id number]  CC:  Chief Complaint  Patient presents with  . Medication Refill  . right hip and knee pain x 1 month    per pt she has some arthritis and she may need to lose some weight and it may help with the pain.  Pain level 6/10, no pain during the day, more bothersome at night and pain radiates into her calf's    HPI Jo [name] presents for  cologuard positive She reports that she has some changes to her bowel movement She denies blood per rectum She states that she was not sure if she wanted    Hypertension: Patient here for follow-up of elevated blood pressure. She is exercising and is adherent to low salt diet.  Blood pressure is well controlled at home. Cardiac symptoms none. Patient denies chest pain, chest pressure/discomfort, dyspnea, fatigue, lower extremity edema, near-syncope and orthopnea.  Cardiovascular risk factors: dyslipidemia and hypertension. Use of agents associated with hypertension: none. History of target organ damage: none. BP Readings from Last 3 Encounters:  10/21/18 139/73  10/16/17 137/80  09/16/17 139/71   Dyslipidemia: Patient presents for evaluation of lipids.  Compliance with treatment thus far has been excellent.  A repeat fasting lipid profile was done.  The patient does not use medications that may worsen dyslipidemias (corticosteroids, progestins, anabolic steroids, diuretics, beta-blockers, amiodarone, cyclosporine, olanzapine). The patient exercises intermittently.  The patient is not known to have coexisting coronary artery disease.   The 10-year ASCVD risk score Mikey Bussing DC Brooke Bonito., et al., 2013) is: 15.3%   Values used to calculate the score:     Age: 54 years     Sex: Female     Is Non-Hispanic African American: Yes     Diabetic: No     Tobacco smoker: No     Systolic Blood Pressure: 641 mmHg     Is BP  treated: Yes     HDL Cholesterol: 64 mg/dL     Total Cholesterol: 173 mg/dL   Prediabetes Lab Results  Component Value Date   HGBA1C 5.9 (H) 04/16/2018   She reports that she is exercising She is drinking plenty of water She denies any chest pains or shortness of breath  Past Medical History:  Diagnosis Date  . Anemia   . Goiter   . Hyperlipidemia   . Hypertension   . Menopause age 25  . Reflux   . Spinal stenosis     Past Surgical History:  Procedure Laterality Date  . CERVICAL SPINE SURGERY  06/2010  . LEFT HEART CATH AND CORONARY ANGIOGRAPHY N/A 09/16/2017   Procedure: LEFT HEART CATH AND CORONARY ANGIOGRAPHY;  Surgeon: Dixie Dials, MD;  Location: [facility];  Service: Cardiovascular;  Laterality: N/A;    Family History  Problem Relation Age of Onset  . Stroke Mother   . Hypertension Mother   . Heart disease Mother   . Heart disease Father   . Hypertension Daughter   . Cancer Sister   . Breast cancer Sister   . Cancer Brother   . Cancer Sister   . Hypertension Other   . Cancer Other        lung x 2    Social History   Socioeconomic History  . Marital status: Widowed    Spouse name: Not on file  . Number of children:  3  . Years of education: college  . Highest education level: Not on file  Occupational History  . 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[employer]  . Financial resource strain: Not on file  . Food insecurity    Worry: Not on file    Inability: Not on file  . Transportation needs    Medical: Not on file    Non-medical: Not on file  Tobacco Use  . Smoking status: Never Smoker  . Smokeless tobacco: Never Used  Substance and Sexual Activity  . Alcohol use: No    Comment: glass of wine-special occasions  . Drug use: No  . Sexual activity: Never    Birth control/protection: Abstinence  Lifestyle  . Physical activity    Days per week: Not on file    Minutes per session: Not on file  . Stress: Not on file   Relationships  . Social Herbalist on phone: Not on file    Gets together: Not on file    Attends religious service: Not on file    Active member of club or organization: Not on file    Attends meetings of clubs or organizations: Not on file    Relationship status: Not on file  . Intimate partner violence    Fear of current or ex partner: Not on file    Emotionally abused: Not on file    Physically abused: Not on file    Forced sexual activity: Not on file  Other Topics Concern  . Not on file  Social History Narrative  . Not on file    Outpatient Medications Prior to Visit  Medication Sig Dispense Refill  . aspirin EC 81 MG tablet Take 81 mg by mouth at bedtime.    Marland Kitchen atorvastatin (LIPITOR) 20 MG tablet Take 1 tablet by mouth once daily 30 tablet 0  . cholecalciferol (VITAMIN D) 1000 units tablet Take 5,000 Units by mouth at bedtime.     . Ferrous Sulfate (IRON SLOW RELEASE PO) Take 1 tablet by mouth daily.    Marland Kitchen HYDROCORTISONE EX Apply 1 application topically daily as needed (itching/bug bites).    Marland Kitchen lisinopril (ZESTRIL) 10 MG tablet Take 1 tablet by mouth once daily 30 tablet 0  . naproxen sodium (ALEVE) 220 MG tablet Take 220 mg by mouth 2 (two) times daily as needed (pain/headache).    . OVER THE COUNTER MEDICATION Take 1 capsule by mouth at bedtime. Over the counter stool softener    . polyethylene glycol (MIRALAX / GLYCOLAX) packet Take 17 g by mouth daily as needed (constipation). Mix in 8 oz liquid and drink    . Probiotic Product (DIGESTIVE ADVANTAGE) CAPS Take 1 capsule by mouth daily.    . vitamin B-12 (CYANOCOBALAMIN) 1000 MCG tablet Take 1,000 mcg by mouth at bedtime.      No facility-administered medications prior to visit.     Allergies  Allergen Reactions  . Demerol Nausea And Vomiting    Severe nausea and vomiting    ROS Review of Systems Review of Systems  Constitutional: Negative for activity change, appetite change, chills and fever.  HENT:  Negative for congestion, nosebleeds, trouble swallowing and voice change.   Respiratory: Negative for cough, shortness of breath and wheezing.   Gastrointestinal: Negative for diarrhea, nausea and vomiting.  Genitourinary: Negative for difficulty urinating, dysuria, flank pain and hematuria.  Musculoskeletal: Negative for back pain, joint swelling and neck pain.  Neurological: Negative for  dizziness, speech difficulty, light-headedness and numbness.  See HPI. All other review of systems negative.     Objective:    Physical Exam  BP 139/73 (BP Location: Left Arm, Patient Position: Sitting, Cuff Size: Large)   Pulse 65   Temp 98.4 F (36.9 C) (Oral)   Resp 17   Ht '5\' 6"'$  (1.676 m)   Wt 192 lb 12.8 oz (87.5 kg)   SpO2 100%   BMI 31.12 kg/m  Wt Readings from Last 3 Encounters:  10/21/18 192 lb 12.8 oz (87.5 kg)  05/11/18 179 lb (81.2 kg)  10/16/17 184 lb 9.6 oz (83.7 kg)   Physical Exam  Constitutional: Oriented to person, place, and time. Appears well-developed and well-nourished.  HENT:  Head: Normocephalic and atraumatic.  Eyes: Conjunctivae and EOM are normal.  Cardiovascular: Normal rate, regular rhythm, normal heart sounds and intact distal pulses.  No murmur heard. Pulmonary/Chest: Effort normal and breath sounds normal. No stridor. No respiratory distress. Has no wheezes.  Abdomen: nondistended, normoactive bs, soft, nontender Neurological: Is alert and oriented to person, place, and time.  Skin: Skin is warm. Capillary refill takes less than 2 seconds.  Psychiatric: Has a normal mood and affect. Behavior is normal. Judgment and thought content normal.    Health Maintenance Due  Topic Date Due  . DEXA SCAN  03/16/2009  . INFLUENZA VACCINE  08/15/2018    There are no preventive care reminders to display for this patient.  Lab Results  Component Value Date   TSH 1.241 11/02/2012   Lab Results  Component Value Date   WBC 8.1 04/16/2018   HGB 11.1 04/16/2018    HCT 32.8 (L) 04/16/2018   MCV 86 04/16/2018   PLT 258 04/16/2018   Lab Results  Component Value Date   NA 140 04/16/2018   K 4.6 04/16/2018   CO2 23 04/16/2018   GLUCOSE 92 04/16/2018   BUN 19 04/16/2018   CREATININE 1.05 (H) 04/16/2018   BILITOT 0.4 04/16/2018   ALKPHOS 76 04/16/2018   AST 19 04/16/2018   ALT 10 04/16/2018   PROT 6.7 04/16/2018   ALBUMIN 3.7 04/16/2018   CALCIUM 9.8 04/16/2018   ANIONGAP 7 09/16/2017   Lab Results  Component Value Date   CHOL 173 12/18/2016   Lab Results  Component Value Date   HDL 64 12/18/2016   Lab Results  Component Value Date   LDLCALC 98 12/18/2016   Lab Results  Component Value Date   TRIG 56 12/18/2016   Lab Results  Component Value Date   CHOLHDL 2.7 12/18/2016   Lab Results  Component Value Date   HGBA1C 5.9 (H) 04/16/2018    Jo [name] was seen today for medication refill and right hip and knee pain x 1 month.  Diagnoses and all orders for this visit:  Pre-diabetes - stable, discussed diabetes prevention  -     CMP14+EGFR -     Hemoglobin A1c  Essential hypertension- Patient's blood pressure is at goal of 139/89 or less. Condition is stable. Continue current medications and treatment plan. I recommend that you exercise for 30-45 minutes 5 days a week. I also recommend a balanced diet with fruits and vegetables every day, lean meats, and little fried foods. The DASH diet (you can find this online) is a good example of this.  -     CMP14+EGFR  Screening for osteoporosis -     HM DEXA SCAN  Need for prophylactic vaccination and inoculation against influenza -  Flu Vaccine QUAD High Dose(Fluad)  Mixed hyperlipidemia - discussed goals and pt is compliant -     CMP14+EGFR -     Lipid panel  Myalgia- will do a drug holiday for 1 month and if patient has improvement in symptoms after stopping lipitor will give pt a 3 months drug holiday and will plan to do Crestor '5mg'$  or lipitor every other day -     CK  isoenzymes (brain, muscle injury)  Estrogen deficiency - will screen for bone density -     HM DEXA SCAN   Positive cologuard - follow up with GI for colonoscopy     No orders of the defined types were placed in this encounter.   Follow-up: No follow-ups on file.   A total of 40 minutes were spent face-to-face with the patient during this encounter and over half of that time was spent on counseling and coordination of care.  Forrest Moron, MD

## 2018-10-24 LAB — LIPID PANEL
Chol/HDL Ratio: 3.3 ratio (ref 0.0–4.4)
Cholesterol, Total: 186 mg/dL (ref 100–199)
HDL: 57 mg/dL (ref >39)
LDL Chol Calc (NIH): 116 mg/dL — ABNORMAL HIGH (ref 0–99)
Triglycerides: 72 mg/dL (ref 0–149)
VLDL Cholesterol Cal: 13 mg/dL (ref 5–40)

## 2018-10-24 LAB — CK ISOENZYMES
CK-BB: 0 %
CK-MB: 0 % (ref 0–3)
CK-MM: 100 % (ref 97–100)
Macro Type 1: 0 %
Macro Type 2: 0 %
Total CK: 182 U/L (ref 32–182)

## 2018-10-24 LAB — CMP14+EGFR
ALT: 11 IU/L (ref 0–32)
AST: 17 IU/L (ref 0–40)
Albumin/Globulin Ratio: 1.4 (ref 1.2–2.2)
Albumin: 4 g/dL (ref 3.7–4.7)
Alkaline Phosphatase: 78 IU/L (ref 39–117)
BUN/Creatinine Ratio: 14 (ref 12–28)
BUN: 15 mg/dL (ref 8–27)
Bilirubin Total: 0.5 mg/dL (ref 0.0–1.2)
CO2: 22 mmol/L (ref 20–29)
Calcium: 9.9 mg/dL (ref 8.7–10.3)
Chloride: 106 mmol/L (ref 96–106)
Creatinine, Ser: 1.11 mg/dL — ABNORMAL HIGH (ref 0.57–1.00)
GFR calc Af Amer: 57 mL/min/{1.73_m2} — ABNORMAL LOW (ref >59)
GFR calc non Af Amer: 49 mL/min/{1.73_m2} — ABNORMAL LOW (ref >59)
Globulin, Total: 2.8 g/dL (ref 1.5–4.5)
Glucose: 92 mg/dL (ref 65–99)
Potassium: 4.8 mmol/L (ref 3.5–5.2)
Sodium: 141 mmol/L (ref 134–144)
Total Protein: 6.8 g/dL (ref 6.0–8.5)

## 2018-10-24 LAB — HEMOGLOBIN A1C
Est. average glucose Bld gHb Est-mCnc: 131 mg/dL
Hgb A1c MFr Bld: 6.2 % — ABNORMAL HIGH (ref 4.8–5.6)

## 2018-10-29 ENCOUNTER — Other Ambulatory Visit: Payer: Self-pay | Admitting: Family Medicine

## 2018-10-29 DIAGNOSIS — E785 Hyperlipidemia, unspecified: Secondary | ICD-10-CM

## 2018-10-29 DIAGNOSIS — I10 Essential (primary) hypertension: Secondary | ICD-10-CM

## 2018-11-09 ENCOUNTER — Telehealth: Payer: Self-pay | Admitting: *Deleted

## 2018-11-09 NOTE — Telephone Encounter (Signed)
Schedule AWV.  

## 2018-11-28 ENCOUNTER — Other Ambulatory Visit: Payer: Self-pay | Admitting: Family Medicine

## 2018-11-28 DIAGNOSIS — I10 Essential (primary) hypertension: Secondary | ICD-10-CM

## 2018-12-09 ENCOUNTER — Other Ambulatory Visit: Payer: Self-pay | Admitting: Family Medicine

## 2018-12-09 DIAGNOSIS — E785 Hyperlipidemia, unspecified: Secondary | ICD-10-CM

## 2018-12-28 ENCOUNTER — Other Ambulatory Visit: Payer: Self-pay | Admitting: Family Medicine

## 2018-12-28 DIAGNOSIS — I10 Essential (primary) hypertension: Secondary | ICD-10-CM

## 2018-12-28 NOTE — Telephone Encounter (Signed)
Requested medication (s) are due for refill today: yes  Requested medication (s) are on the active medication list: yes  Last refill:  11/29/2018  Future visit scheduled: no  Notes to clinic: per provider patient needs office visit    Requested Prescriptions  Pending Prescriptions Disp Refills   lisinopril (ZESTRIL) 10 MG tablet [Pharmacy Med Name: Lisinopril 10 MG Oral Tablet] 30 tablet 0    Sig: TAKE 1 TABLET BY MOUTH ONCE DAILY(APPT REQUIRED FOR FUTURE REFILLS)      Cardiovascular:  ACE Inhibitors Failed - 12/28/2018  3:07 PM      Failed - Cr in normal range and within 180 days    Creat  Date Value Ref Range Status  12/13/2015 0.89 0.60 - 0.93 mg/dL Final    Comment:      For patients > or = 74 years of age: The upper reference limit for Creatinine is approximately 13% higher for people identified as African-American.      Creatinine, Ser  Date Value Ref Range Status  10/21/2018 1.11 (H) 0.57 - 1.00 mg/dL Final          Passed - K in normal range and within 180 days    Potassium  Date Value Ref Range Status  10/21/2018 4.8 3.5 - 5.2 mmol/L Final          Passed - Patient is not pregnant      Passed - Last BP in normal range    BP Readings from Last 1 Encounters:  10/21/18 139/73          Passed - Valid encounter within last 6 months    Recent Outpatient Visits           2 months ago Pre-diabetes   Primary [facility] at [facility], Arlie Solomons, MD   8 months ago Essential hypertension   [facility] at [facility], Arlie Solomons, MD   1 year ago Essential hypertension   Primary Care at [facility], Arlie Solomons, MD   1 year ago Essential hypertension   Primary Care at [facility], [place], MD   2 years ago Mild anemia   Primary Care at [facility], Arlie Solomons, MD

## 2019-01-01 ENCOUNTER — Telehealth: Payer: Self-pay | Admitting: Family Medicine

## 2019-01-01 NOTE — Telephone Encounter (Signed)
Pt wants to know if she needs to make appt. to continue to get refills FR  Pt was here in October and was thinking med refills should have been 3 month supply now is only getting 1 month supply. Please reach out to patient

## 2019-01-04 ENCOUNTER — Other Ambulatory Visit: Payer: Self-pay | Admitting: Emergency Medicine

## 2019-01-04 DIAGNOSIS — I10 Essential (primary) hypertension: Secondary | ICD-10-CM

## 2019-01-04 DIAGNOSIS — E785 Hyperlipidemia, unspecified: Secondary | ICD-10-CM

## 2019-01-04 NOTE — Telephone Encounter (Signed)
Spoke with Patient and she stated she is good on her med and will talk to Dr Nolon Rod at upcoming visit to talk about if need to come off the other med

## 2019-01-18 ENCOUNTER — Other Ambulatory Visit: Payer: Self-pay

## 2019-01-18 ENCOUNTER — Ambulatory Visit (INDEPENDENT_AMBULATORY_CARE_PROVIDER_SITE_OTHER): Payer: Medicare Other | Admitting: Family Medicine

## 2019-01-18 ENCOUNTER — Encounter: Payer: Self-pay | Admitting: Family Medicine

## 2019-01-18 VITALS — BP 134/80 | HR 78 | Temp 98.4°F | Resp 17 | Ht 66.0 in | Wt 200.2 lb

## 2019-01-18 DIAGNOSIS — I10 Essential (primary) hypertension: Secondary | ICD-10-CM

## 2019-01-18 DIAGNOSIS — R252 Cramp and spasm: Secondary | ICD-10-CM | POA: Diagnosis not present

## 2019-01-18 DIAGNOSIS — E782 Mixed hyperlipidemia: Secondary | ICD-10-CM | POA: Diagnosis not present

## 2019-01-18 MED ORDER — LISINOPRIL 10 MG PO TABS: ORAL_TABLET | ORAL | 1 refills | Status: DC

## 2019-01-18 MED ORDER — ATORVASTATIN CALCIUM 20 MG PO TABS: 20.0000 mg | ORAL_TABLET | ORAL | 1 refills | Status: DC

## 2019-01-18 NOTE — Progress Notes (Signed)
Established Patient Office Visit  Subjective:  [name], female    DOB: February 22, 1945  Age: 74 y.o. MRN: [id number]  CC:  Chief Complaint  Patient presents with  . medication recheck    pt confused about what medication she is to be taking and which to discontinue. Unsure if she is suppose to stop atorvastatin or the lisinopril;    HPI Jo [name] presents for   Mixed Hyperlipidemia She reports that she is not taking atorvastatin to do a period of observation to see if it was causing muscle cramps.  The cramps improved once she stopped the atorvastatin.  She states that she stuck to a good diet but knows she might need some cholesterol medication.    Hypertension Patient reports that she needed refills of the lisinopril. She states that she has been drinking plenty of water. She states that she has been taking her lisinopril as scheduled  Lab Results  Component Value Date   CREATININE 1.11 (H) 10/21/2018   BP Readings from Last 3 Encounters:  01/18/19 134/80  10/21/18 139/73  10/16/17 137/80     Past Medical History:  Diagnosis Date  . Anemia   . Goiter   . Hyperlipidemia   . Hypertension   . Menopause age 62  . Reflux   . Spinal stenosis     Past Surgical History:  Procedure Laterality Date  . CERVICAL SPINE SURGERY  06/2010  . LEFT HEART CATH AND CORONARY ANGIOGRAPHY N/A 09/16/2017   Procedure: LEFT HEART CATH AND CORONARY ANGIOGRAPHY;  Surgeon: Dixie Dials, MD;  Location: [facility];  Service: Cardiovascular;  Laterality: N/A;    Family History  Problem Relation Age of Onset  . Stroke Mother   . Hypertension Mother   . Heart disease Mother   . Heart disease Father   . Hypertension Daughter   . Cancer Sister   . Breast cancer Sister   . Cancer Brother   . Cancer Sister   . Hypertension Other   . Cancer Other        lung x 2    Social History   Socioeconomic History  . Marital status: Widowed    Spouse  name: Not on file  . Number of children: 3  . Years of education: college  . Highest education level: Not on file  Occupational History  . 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[employer]  Tobacco Use  . Smoking status: Never Smoker  . Smokeless tobacco: Never Used  Substance and Sexual Activity  . Alcohol use: No    Comment: glass of wine-special occasions  . Drug use: No  . Sexual activity: Never    Birth control/protection: Abstinence  Other Topics Concern  . Not on file  Social History Narrative  . Not on file   Social Determinants of Health   Financial Resource Strain:   . Difficulty of Paying Living Expenses: Not on file  Food Insecurity:   . Worried About Charity fundraiser in the Last Year: Not on file  . Ran Out of Food in the Last Year: Not on file  Transportation Needs:   . Lack of Transportation (Medical): Not on file  . Lack of Transportation (Non-Medical): Not on file  Physical Activity:   . Days of Exercise per Week: Not on file  . Minutes of Exercise per Session: Not on file  Stress:   . Feeling of Stress : Not on file  Social Connections:   . Frequency of Communication with Friends and Family: Not on file  . Frequency of Social Gatherings with Friends and Family: Not on file  . Attends Religious Services: Not on file  . Active Member of Clubs or Organizations: Not on file  . Attends Archivist Meetings: Not on file  . Marital Status: Not on file  Intimate Partner Violence:   . Fear of Current or Ex-Partner: Not on file  . Emotionally Abused: Not on file  . Physically Abused: Not on file  . Sexually Abused: Not on file    Outpatient Medications Prior to Visit  Medication Sig Dispense Refill  . aspirin EC 81 MG tablet Take 81 mg by mouth at bedtime.    . cholecalciferol (VITAMIN D) 1000 units tablet Take 5,000 Units by mouth at bedtime.     . Ferrous Sulfate (IRON SLOW RELEASE PO) Take 1 tablet by mouth daily.    Marland Kitchen HYDROCORTISONE EX Apply  1 application topically daily as needed (itching/bug bites).    . naproxen sodium (ALEVE) 220 MG tablet Take 220 mg by mouth 2 (two) times daily as needed (pain/headache).    . OVER THE COUNTER MEDICATION Take 1 capsule by mouth at bedtime. Over the counter stool softener    . polyethylene glycol (MIRALAX / GLYCOLAX) packet Take 17 g by mouth daily as needed (constipation). Mix in 8 oz liquid and drink    . Probiotic Product (DIGESTIVE ADVANTAGE) CAPS Take 1 capsule by mouth daily.    . vitamin B-12 (CYANOCOBALAMIN) 1000 MCG tablet Take 1,000 mcg by mouth at bedtime.     Marland Kitchen atorvastatin (LIPITOR) 20 MG tablet Take 1 tablet by mouth once daily 30 tablet 0  . lisinopril (ZESTRIL) 10 MG tablet TAKE 1 TABLET BY MOUTH ONCE DAILY(APPT REQUIRED FOR FUTURE REFILLS) 30 tablet 0   No facility-administered medications prior to visit.    Allergies  Allergen Reactions  . Demerol Nausea And Vomiting    Severe nausea and vomiting    ROS Review of Systems Review of Systems  Constitutional: Negative for activity change, appetite change, chills and fever.  HENT: Negative for congestion, nosebleeds, trouble swallowing and voice change.   Respiratory: Negative for cough, shortness of breath and wheezing.   Gastrointestinal: Negative for diarrhea, nausea and vomiting.  Genitourinary: Negative for difficulty urinating, dysuria, flank pain and hematuria.  Musculoskeletal: Negative for back pain, joint swelling and neck pain.  Neurological: Negative for dizziness, speech difficulty, light-headedness and numbness.  See HPI. All other review of systems negative.     Objective:    Physical Exam  BP (!) 148/87 (BP Location: Left Arm, Patient Position: Sitting, Cuff Size: Large)   Pulse 78   Temp 98.4 F (36.9 C) (Oral)   Resp 17   Ht 5\' 6"  (1.676 m)   Wt 200 lb 3.2 oz (90.8 kg)   SpO2 98%   BMI 32.31 kg/m  Wt Readings from Last 3 Encounters:  01/18/19 200 lb 3.2 oz (90.8 kg)  10/21/18 192 lb 12.8  oz (87.5 kg)  05/11/18 179 lb (81.2 kg)   Physical Exam  Constitutional: Oriented to person, place, and time. Appears well-developed and well-nourished.  HENT:  Head: Normocephalic and atraumatic.  Eyes: Conjunctivae and EOM are normal.  Cardiovascular: Normal rate, regular rhythm, normal heart sounds and intact distal pulses.  No murmur heard. Pulmonary/Chest: Effort normal and breath sounds normal. No stridor. No respiratory distress. Has no wheezes.  Neurological: Is alert  and oriented to person, place, and time.  Skin: Skin is warm. Capillary refill takes less than 2 seconds.  Psychiatric: Has a normal mood and affect. Behavior is normal. Judgment and thought content normal.    There are no preventive care reminders to display for this patient.  There are no preventive care reminders to display for this patient.  Lab Results  Component Value Date   TSH 1.241 11/02/2012   Lab Results  Component Value Date   WBC 8.1 04/16/2018   HGB 11.1 04/16/2018   HCT 32.8 (L) 04/16/2018   MCV 86 04/16/2018   PLT 258 04/16/2018   Lab Results  Component Value Date   NA 141 10/21/2018   K 4.8 10/21/2018   CO2 22 10/21/2018   GLUCOSE 92 10/21/2018   BUN 15 10/21/2018   CREATININE 1.11 (H) 10/21/2018   BILITOT 0.5 10/21/2018   ALKPHOS 78 10/21/2018   AST 17 10/21/2018   ALT 11 10/21/2018   PROT 6.8 10/21/2018   ALBUMIN 4.0 10/21/2018   CALCIUM 9.9 10/21/2018   ANIONGAP 7 09/16/2017   Lab Results  Component Value Date   CHOL 186 10/21/2018   Lab Results  Component Value Date   HDL 57 10/21/2018   Lab Results  Component Value Date   LDLCALC 116 (H) 10/21/2018   Lab Results  Component Value Date   TRIG 72 10/21/2018   Lab Results  Component Value Date   CHOLHDL 3.3 10/21/2018   Lab Results  Component Value Date   HGBA1C 6.2 (H) 10/21/2018      Assessment & Plan:   Problem List Items Addressed This Visit      Cardiovascular and Mediastinum   Hypertension -  Primary Reviewed labs, refilled lisinopril bp well controlled   Relevant Medications   lisinopril (ZESTRIL) 10 MG tablet   atorvastatin (LIPITOR) 20 MG tablet     Other   Hyperlipidemia - decreased the atorvastatin to every  Other day Drug holiday showed that the lipitor caused cramps so reduced to every other day  Muscle cramps -  Improved off lipitor   Relevant Medications   lisinopril (ZESTRIL) 10 MG tablet   atorvastatin (LIPITOR) 20 MG tablet      Meds ordered this encounter  Medications  . lisinopril (ZESTRIL) 10 MG tablet    Sig: TAKE 1 TABLET BY MOUTH ONCE DAILY    Dispense:  90 tablet    Refill:  1  . atorvastatin (LIPITOR) 20 MG tablet    Sig: Take 1 tablet (20 mg total) by mouth every other day.    Dispense:  45 tablet    Refill:  1    Follow-up: No follow-ups on file.    Forrest Moron, MD

## 2019-01-18 NOTE — Patient Instructions (Signed)
     If you have lab work done today you will be contacted with your lab results within the next 2 weeks.  If you have not heard from us then please contact us. The fastest way to get your results is to register for My Chart.   IF you received an x-ray today, you will receive an invoice from [facility]. Please contact [facility] at [phone] with questions or concerns regarding your invoice.   IF you received labwork today, you will receive an invoice from [facility]. Please contact [facility] at [phone] with questions or concerns regarding your invoice.   Our billing staff will not be able to assist you with questions regarding bills from these companies.  You will be contacted with the lab results as soon as they are available. The fastest way to get your results is to activate your My Chart account. Instructions are located on the last page of this paperwork. If you have not heard from us regarding the results in 2 weeks, please contact this office.        If you have lab work done today you will be contacted with your lab results within the next 2 weeks.  If you have not heard from us then please contact us. The fastest way to get your results is to register for My Chart.   IF you received an x-ray today, you will receive an invoice from [facility]. Please contact [facility] at [phone] with questions or concerns regarding your invoice.   IF you received labwork today, you will receive an invoice from [facility]. Please contact [facility] at [phone] with questions or concerns regarding your invoice.   Our billing staff will not be able to assist you with questions regarding bills from these companies.  You will be contacted with the lab results as soon as they are available. The fastest way to get your results is to activate your My Chart account. Instructions are located on the last page of this paperwork. If you have not heard from us  regarding the results in 2 weeks, please contact this office.     

## 2019-02-04 ENCOUNTER — Other Ambulatory Visit: Payer: Self-pay | Admitting: Family Medicine

## 2019-02-04 DIAGNOSIS — E782 Mixed hyperlipidemia: Secondary | ICD-10-CM

## 2019-02-22 ENCOUNTER — Telehealth: Payer: Self-pay | Admitting: *Deleted

## 2019-02-22 NOTE — Telephone Encounter (Signed)
Schedule an AWV

## 2019-03-16 ENCOUNTER — Ambulatory Visit: Payer: Self-pay

## 2019-03-17 ENCOUNTER — Ambulatory Visit (INDEPENDENT_AMBULATORY_CARE_PROVIDER_SITE_OTHER): Payer: Medicare Other | Admitting: Family Medicine

## 2019-03-17 VITALS — BP 134/80 | Ht 67.0 in | Wt 200.0 lb

## 2019-03-17 DIAGNOSIS — Z0000 Encounter for general adult medical examination without abnormal findings: Secondary | ICD-10-CM | POA: Diagnosis not present

## 2019-03-17 NOTE — Patient Instructions (Signed)
Thank you for taking time to come for your Medicare Wellness Visit. I appreciate your ongoing commitment to your health goals. Please review the following plan we discussed and let me know if I can assist you in the future.  Jonnie Truxillo LPN  Preventive Care 75 Years and Older, Female Preventive care refers to lifestyle choices and visits with your health care provider that can promote health and wellness. This includes:  A yearly physical exam. This is also called an annual well check.  Regular dental and eye exams.  Immunizations.  Screening for certain conditions.  Healthy lifestyle choices, such as diet and exercise. What can I expect for my preventive care visit? Physical exam Your health care provider will check:  Height and weight. These may be used to calculate body mass index (BMI), which is a measurement that tells if you are at a healthy weight.  Heart rate and blood pressure.  Your skin for abnormal spots. Counseling Your health care provider may ask you questions about:  Alcohol, tobacco, and drug use.  Emotional well-being.  Home and relationship well-being.  Sexual activity.  Eating habits.  History of falls.  Memory and ability to understand (cognition).  Work and work environment.  Pregnancy and menstrual history. What immunizations do I need?  Influenza (flu) vaccine  This is recommended every year. Tetanus, diphtheria, and pertussis (Tdap) vaccine  You may need a Td booster every 10 years. Varicella (chickenpox) vaccine  You may need this vaccine if you have not already been vaccinated. Zoster (shingles) vaccine  You may need this after age 75. Pneumococcal conjugate (PCV13) vaccine  One dose is recommended after age 75. Pneumococcal polysaccharide (PPSV23) vaccine  One dose is recommended after age 75. Measles, mumps, and rubella (MMR) vaccine  You may need at least one dose of MMR if you were born in 1957 or later. You may also  need a second dose. Meningococcal conjugate (MenACWY) vaccine  You may need this if you have certain conditions. Hepatitis A vaccine  You may need this if you have certain conditions or if you travel or work in places where you may be exposed to hepatitis A. Hepatitis B vaccine  You may need this if you have certain conditions or if you travel or work in places where you may be exposed to hepatitis B. Haemophilus influenzae type b (Hib) vaccine  You may need this if you have certain conditions. You may receive vaccines as individual doses or as more than one vaccine together in one shot (combination vaccines). Talk with your health care provider about the risks and benefits of combination vaccines. What tests do I need? Blood tests  Lipid and cholesterol levels. These may be checked every 5 years, or more frequently depending on your overall health.  Hepatitis C test.  Hepatitis B test. Screening  Lung cancer screening. You may have this screening every year starting at age 75 if you have a 30-pack-year history of smoking and currently smoke or have quit within the past 15 years.  Colorectal cancer screening. All adults should have this screening starting at age 75 and continuing until age 75. Your health care provider may recommend screening at age 45 if you are at increased risk. You will have tests every 1-10 years, depending on your results and the type of screening test.  Diabetes screening. This is done by checking your blood sugar (glucose) after you have not eaten for a while (fasting). You may have this done every 1-3   years.  Mammogram. This may be done every 1-2 years. Talk with your health care provider about how often you should have regular mammograms.  BRCA-related cancer screening. This may be done if you have a family history of breast, ovarian, tubal, or peritoneal cancers. Other tests  Sexually transmitted disease (STD) testing.  Bone density scan. This is done  to screen for osteoporosis. You may have this done starting at age 75. Follow these instructions at home: Eating and drinking  Eat a diet that includes fresh fruits and vegetables, whole grains, lean protein, and low-fat dairy products. Limit your intake of foods with high amounts of sugar, saturated fats, and salt.  Take vitamin and mineral supplements as recommended by your health care provider.  Do not drink alcohol if your health care provider tells you not to drink.  If you drink alcohol: ? Limit how much you have to 0-1 drink a day. ? Be aware of how much alcohol is in your drink. In the [place] one drink equals one 12 oz bottle of beer (355 mL), one 5 oz glass of wine (148 mL), or one 1 oz glass of hard liquor (44 mL). Lifestyle  Take daily care of your teeth and gums.  Stay active. Exercise for at least 30 minutes on 5 or more days each week.  Do not use any products that contain nicotine or tobacco, such as cigarettes, e-cigarettes, and chewing tobacco. If you need help quitting, ask your health care provider.  If you are sexually active, practice safe sex. Use a condom or other form of protection in order to prevent STIs (sexually transmitted infections).  Talk with your health care provider about taking a low-dose aspirin or statin. What's next?  Go to your health care provider once a year for a well check visit.  Ask your health care provider how often you should have your eyes and teeth checked.  Stay up to date on all vaccines. This information is not intended to replace advice given to you by your health care provider. Make sure you discuss any questions you have with your health care provider. Document Revised: 12/25/2017 Document Reviewed: 12/25/2017 Elsevier Patient Education  2020 [employer].

## 2019-03-17 NOTE — Progress Notes (Signed)
Presents today for [employer] Visit   Date of last exam: 01/18/2019   Interpreter used for this visit? No  I connected with  Jo [name] on 03/17/19 by a telephone application and verified that I am speaking with the correct person using two identifiers.   I discussed the limitations of evaluation and management by telemedicine. The patient expressed understanding and agreed to proceed.    Patient Care Team: Forrest Moron, MD as PCP - General (Internal Medicine) [name], Legrand Como, MD (Endocrinology) Garlan Fair, MD (Gastroenterology) Hazle Coca, MD (Inactive) (Neurosurgery)   Discussed Eye/Dental Discussed immunizations    Other Screening: Last screening for diabetes: 10/21/2018 Last lipid screening: 10/21/2018  ADVANCE DIRECTIVES: Discussed: YES On File: NO Materials Provided: YES  Immunization status:  Immunization History  Administered Date(s) Administered  . Fluad Quad(high Dose 65+) 10/21/2018  . Influenza Whole 01/14/2009  . Influenza, High Dose Seasonal PF 10/16/2017  . Influenza,inj,Quad PF,6+ Mos 11/02/2012, 11/22/2013, 01/25/2015, 12/13/2015, 11/13/2016  . Pneumococcal Conjugate-13 11/22/2013  . Pneumococcal Polysaccharide-23 04/14/2009  . Tdap 04/14/2009     There are no preventive care reminders to display for this patient.   Functional Status Survey: Is the patient deaf or have difficulty hearing?: No Does the patient have difficulty seeing, even when wearing glasses/contacts?: No Does the patient have difficulty concentrating, remembering, or making decisions?: No Does the patient have difficulty walking or climbing stairs?: No Does the patient have difficulty dressing or bathing?: No Does the patient have difficulty doing errands alone such as visiting a doctor's office or shopping?: No   6CIT Screen 03/17/2019  What Year? 0 points  What month? 0 points  What time? 0 points  Count back from 20 0  points  Months in reverse 0 points  Repeat phrase 0 points  Total Score 0        Clinical Support from 03/17/2019 in [facility]  AUDIT-C Score  0       Home Environment:   Lives in 3 story home No scattered rugs No grab bars Adequate lighting/ no clutter No trouble climbing stairs  Timed warm up N/A   Patient Active Problem List   Diagnosis Date Noted  . Chest pain 09/14/2017  . Impaired fasting glucose 12/18/2016  . Pre-diabetes 10/25/2014  . Hypertension   . Hyperlipidemia   . Reflux   . Mild anemia      Past Medical History:  Diagnosis Date  . Anemia   . Goiter   . Hyperlipidemia   . Hypertension   . Menopause age 74  . Reflux   . Spinal stenosis      Past Surgical History:  Procedure Laterality Date  . CERVICAL SPINE SURGERY  06/2010  . LEFT HEART CATH AND CORONARY ANGIOGRAPHY N/A 09/16/2017   Procedure: LEFT HEART CATH AND CORONARY ANGIOGRAPHY;  Surgeon: Dixie Dials, MD;  Location: [facility];  Service: Cardiovascular;  Laterality: N/A;     Family History  Problem Relation Age of Onset  . Stroke Mother   . Hypertension Mother   . Heart disease Mother   . Heart disease Father   . Hypertension Daughter   . Cancer Sister   . Breast cancer Sister   . Cancer Brother   . Cancer Sister   . Hypertension Other   . Cancer Other        lung x 2     Social History   Socioeconomic History  .  Marital status: Widowed    Spouse name: Not on file  . Number of children: 3  . Years of education: college  . Highest education level: Not on file  Occupational History  . 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[employer]  Tobacco Use  . Smoking status: Never Smoker  . Smokeless tobacco: Never Used  Substance and Sexual Activity  . Alcohol use: No    Comment: glass of wine-special occasions  . Drug use: No  . Sexual activity: Never    Birth control/protection: Abstinence  Other Topics Concern  . Not on file  Social History  Narrative  . Not on file   Social Determinants of Health   Financial Resource Strain:   . Difficulty of Paying Living Expenses: Not on file  Food Insecurity:   . Worried About Charity fundraiser in the Last Year: Not on file  . Ran Out of Food in the Last Year: Not on file  Transportation Needs:   . Lack of Transportation (Medical): Not on file  . Lack of Transportation (Non-Medical): Not on file  Physical Activity:   . Days of Exercise per Week: Not on file  . Minutes of Exercise per Session: Not on file  Stress:   . Feeling of Stress : Not on file  Social Connections:   . Frequency of Communication with Friends and Family: Not on file  . Frequency of Social Gatherings with Friends and Family: Not on file  . Attends Religious Services: Not on file  . Active Member of Clubs or Organizations: Not on file  . Attends Archivist Meetings: Not on file  . Marital Status: Not on file  Intimate Partner Violence:   . Fear of Current or Ex-Partner: Not on file  . Emotionally Abused: Not on file  . Physically Abused: Not on file  . Sexually Abused: Not on file     Allergies  Allergen Reactions  . Demerol Nausea And Vomiting    Severe nausea and vomiting     Prior to Admission medications   Medication Sig Start Date End Date Taking? Authorizing Provider  aspirin EC 81 MG tablet Take 81 mg by mouth at bedtime.   Yes [provider]  atorvastatin (LIPITOR) 20 MG tablet Take 1 tablet by mouth once daily 02/04/19  Yes Stallings, Zoe A, MD  cholecalciferol (VITAMIN D) 1000 units tablet Take 5,000 Units by mouth at bedtime.    Yes [provider]  Ferrous Sulfate (IRON SLOW RELEASE PO) Take 1 tablet by mouth daily.   Yes [provider]  HYDROCORTISONE EX Apply 1 application topically daily as needed (itching/bug bites).   Yes [provider]  lisinopril (ZESTRIL) 10 MG tablet TAKE 1 TABLET BY MOUTH ONCE DAILY 01/18/19  Yes Stallings, Zoe A, MD   naproxen sodium (ALEVE) 220 MG tablet Take 220 mg by mouth 2 (two) times daily as needed (pain/headache).   Yes [provider]  OVER THE COUNTER MEDICATION Take 1 capsule by mouth at bedtime. Over the counter stool softener   Yes [provider]  polyethylene glycol (MIRALAX / GLYCOLAX) packet Take 17 g by mouth daily as needed (constipation). Mix in 8 oz liquid and drink   Yes [provider]  Probiotic Product (DIGESTIVE ADVANTAGE) CAPS Take 1 capsule by mouth daily.   Yes [provider]  vitamin B-12 (CYANOCOBALAMIN) 1000 MCG tablet Take 1,000 mcg by mouth at bedtime.    Yes [provider]  Depression screen [facility] 2/9 03/17/2019 01/18/2019 10/21/2018 10/16/2017 06/23/2017  Decreased Interest 0 0 0 0 0  Down, Depressed, Hopeless 0 0 0 0 0  PHQ - 2 Score 0 0 0 0 0     Fall Risk  03/17/2019 01/18/2019 10/21/2018 10/16/2017 06/23/2017  Falls in the past year? 0 0 0 No No  Number falls in past yr: 0 0 0 - -  Injury with Fall? 0 0 0 - -  Follow up Falls evaluation completed;Education provided Falls evaluation completed Falls evaluation completed - -      PHYSICAL EXAM: BP 134/80 Comment: taken from a previous  Ht 5\' 7"  (1.702 m)   Wt 200 lb (90.7 kg)   BMI 31.32 kg/m    Wt Readings from Last 3 Encounters:  03/17/19 200 lb (90.7 kg)  01/18/19 200 lb 3.2 oz (90.8 kg)  10/21/18 192 lb 12.8 oz (87.5 kg)      Education/Counseling provided regarding diet and exercise, prevention of chronic diseases, smoking/tobacco cessation, if applicable, and reviewed "Covered Medicare Preventive Services."

## 2019-08-24 ENCOUNTER — Telehealth: Payer: Self-pay | Admitting: Interventional Cardiology

## 2019-08-24 ENCOUNTER — Telehealth: Payer: Self-pay | Admitting: Family Medicine

## 2019-08-24 ENCOUNTER — Other Ambulatory Visit: Payer: Self-pay

## 2019-08-24 DIAGNOSIS — I10 Essential (primary) hypertension: Secondary | ICD-10-CM

## 2019-08-24 MED ORDER — LISINOPRIL 10 MG PO TABS: ORAL_TABLET | ORAL | 0 refills | Status: DC

## 2019-08-24 NOTE — Telephone Encounter (Signed)
Pt came in and is needing a refill sent in of   lisinopril (ZESTRIL) 10 MG tablet [[id number]]   Pt does have a toc sch for 10/29/19 with Sagardia. Please advise

## 2019-08-24 NOTE — Telephone Encounter (Signed)
New Message  Patient calling in to check on the status of her referral and records being sent. Informed that we have not received them yet.

## 2019-09-01 ENCOUNTER — Other Ambulatory Visit: Payer: Self-pay

## 2019-09-01 DIAGNOSIS — E782 Mixed hyperlipidemia: Secondary | ICD-10-CM

## 2019-09-01 MED ORDER — ATORVASTATIN CALCIUM 20 MG PO TABS: 20.0000 mg | ORAL_TABLET | Freq: Every day | ORAL | 0 refills | Status: DC

## 2019-09-01 NOTE — Telephone Encounter (Signed)
Paper script came in and Atorvastatin has been refilled until pt next ov.

## 2019-09-06 ENCOUNTER — Telehealth: Payer: Self-pay

## 2019-09-06 NOTE — Telephone Encounter (Signed)
NOTES ON FILE FROM [facility], SENT REFERRAL TO SCHEDULING

## 2019-10-11 ENCOUNTER — Other Ambulatory Visit: Payer: Self-pay

## 2019-10-11 ENCOUNTER — Encounter: Payer: Self-pay | Admitting: Cardiovascular Disease

## 2019-10-11 ENCOUNTER — Ambulatory Visit (INDEPENDENT_AMBULATORY_CARE_PROVIDER_SITE_OTHER): Payer: Medicare Other | Admitting: Cardiovascular Disease

## 2019-10-11 VITALS — BP 148/78 | HR 69 | Ht 66.5 in | Wt 186.6 lb

## 2019-10-11 DIAGNOSIS — E782 Mixed hyperlipidemia: Secondary | ICD-10-CM

## 2019-10-11 DIAGNOSIS — I6529 Occlusion and stenosis of unspecified carotid artery: Secondary | ICD-10-CM | POA: Insufficient documentation

## 2019-10-11 DIAGNOSIS — E7800 Pure hypercholesterolemia, unspecified: Secondary | ICD-10-CM

## 2019-10-11 DIAGNOSIS — R0989 Other specified symptoms and signs involving the circulatory and respiratory systems: Secondary | ICD-10-CM | POA: Diagnosis not present

## 2019-10-11 DIAGNOSIS — Z5181 Encounter for therapeutic drug level monitoring: Secondary | ICD-10-CM

## 2019-10-11 DIAGNOSIS — I10 Essential (primary) hypertension: Secondary | ICD-10-CM | POA: Diagnosis not present

## 2019-10-11 DIAGNOSIS — R609 Edema, unspecified: Secondary | ICD-10-CM | POA: Diagnosis not present

## 2019-10-11 DIAGNOSIS — E785 Hyperlipidemia, unspecified: Secondary | ICD-10-CM | POA: Insufficient documentation

## 2019-10-11 DIAGNOSIS — R600 Localized edema: Secondary | ICD-10-CM | POA: Insufficient documentation

## 2019-10-11 HISTORY — DX: Localized edema: R60.0

## 2019-10-11 HISTORY — DX: Pure hypercholesterolemia, unspecified: E78.00

## 2019-10-11 HISTORY — DX: Occlusion and stenosis of unspecified carotid artery: I65.29

## 2019-10-11 HISTORY — DX: Other specified symptoms and signs involving the circulatory and respiratory systems: R09.89

## 2019-10-11 MED ORDER — HYDROCHLOROTHIAZIDE 12.5 MG PO TABS: 12.5000 mg | ORAL_TABLET | Freq: Every day | ORAL | 3 refills | Status: DC

## 2019-10-11 NOTE — Patient Instructions (Signed)
Medication Instructions:  STOP LISINOPRIL   START HYDRO CHLORTHIAZIDE 12.5 MG DAILY   *If you need a refill on your cardiac medications before your next appointment, please call your pharmacy*  Lab Work: FASTING LP/BNP/BMET/TSH IN 1 WEEK   If you have labs (blood work) drawn today and your tests are completely normal, you will receive your results only by: Marland Kitchen MyChart Message (if you have MyChart) OR . A paper copy in the mail If you have any lab test that is abnormal or we need to change your treatment, we will call you to review the results.  Testing/Procedures: Your physician has requested that you have a carotid duplex. This test is an ultrasound of the carotid arteries in your neck. It looks at blood flow through these arteries that supply the brain with blood. Allow one hour for this exam. There are no restrictions or special instructions.  Follow-Up: At [facility], you and your health needs are our priority.  As part of our continuing mission to provide you with exceptional heart care, we have created designated Provider Care Teams.  These Care Teams include your primary Cardiologist (physician) and Advanced Practice Providers (APPs -  Physician Assistants and Nurse Practitioners) who all work together to provide you with the care you need, when you need it.  We recommend signing up for the patient portal called "MyChart".  Sign up information is provided on this After Visit Summary.  MyChart is used to connect with patients for Virtual Visits (Telemedicine).  Patients are able to view lab/test results, encounter notes, upcoming appointments, etc.  Non-urgent messages can be sent to your provider as well.   To learn more about what you can do with MyChart, go to [email or web address].    Your next appointment:   2 month(s)  The format for your next appointment:   In Person  Provider:   You may see DR [facility] or one of the following Advanced Practice Providers on your  designated Care Team:    Kerin Ransom, PA-C  [place], [place]  Coletta Memos, [place]  Other Instructions  MONITOR YOUR BLOOD PRESSURE DAILY, LOG IN BOOK PROVIDED. BRING BOOK AND BLOOD PRESSURE MACHINE TO YOUR FOLLOW UP

## 2019-10-11 NOTE — Progress Notes (Signed)
Hypertension Clinic Initial Assessment:    Date:  10/11/2019   ID:  Jo [name], DOB October 13, 1945, MRN [id number]  PCP:  Carylon Perches, NP  Cardiologist:  No primary care provider on file.  Nephrologist:  Referring MD: Forrest Moron, MD   CC: Hypertension  History of Present Illness:    Jo [name] is a 74 y.o. female with a hx of hypertension, hyperlipidemia, and pre-diabetes here to establish care in the hypertension clinic.  She previously saw Dr. Doylene Canard 09/2017.  At the time she had chest pain.  She had an echo at that time that revealed LVEF 65 to 70% with mild LVH and grade 1 diastolic dysfunction.  Mild calcification was noted of the aortic valve but there was no aortic stenosis.  She also has significant mitral annular calcification.  She had a left heart catheterization that showed no coronary artery disease.    [name] was diagnosed with hypertension about 20 years ago.  It has been pretty well controlled on lisinopril.  In the last several months it has been running a little higher.  At home it has been mostly in the 130s/80s and sometimes higher. She has been feeling more stressed due to having to help take care of several family members who are ill.  Her brother also recently died.  She notes that her appetite has been poor and that she mostly eats to live.  She has lost some weight recently which she attributes to her poor appetite.  She also eats very healthy and mostly cooks at home.  She has limited meat and typically avoids red meat.  She likes fresh fruits and vegetables.  She also tries to limit the sodium in her food.  Lately she has noted some mild lower extremity edema in the right more than the left legs.  She denies orthopnea or PND.  She has no calf pain or prolonged travel.  She does wear compression stockings at times which helps.  She recently started back exercising by going to the gym.  She has no exertional chest pain or shortness of breath.   She denies orthopnea or PND.  She drinks a half a cup of coffee daily.  She uses Aleve rarely.  She has no over-the-counter supplements or herbs.  Previous antihypertensives: Lisinopril   Past Medical History:  Diagnosis Date  . Anemia   . Carotid bruit 10/11/2019  . Goiter   . Hyperlipidemia   . Hypertension   . Lower extremity edema 10/11/2019  . Menopause age 32  . Pure hypercholesterolemia 10/11/2019  . Reflux   . Spinal stenosis     Past Surgical History:  Procedure Laterality Date  . CERVICAL SPINE SURGERY  06/2010  . LEFT HEART CATH AND CORONARY ANGIOGRAPHY N/A 09/16/2017   Procedure: LEFT HEART CATH AND CORONARY ANGIOGRAPHY;  Surgeon: Dixie Dials, MD;  Location: [facility];  Service: Cardiovascular;  Laterality: N/A;    Current Medications: Current Meds  Medication Sig  . aspirin EC 81 MG tablet Take 81 mg by mouth at bedtime.  . cholecalciferol (VITAMIN D) 1000 units tablet Take 5,000 Units by mouth at bedtime.   . Ferrous Sulfate (IRON SLOW RELEASE PO) Take 1 tablet by mouth daily.  Marland Kitchen HYDROCORTISONE EX Apply 1 application topically daily as needed (itching/bug bites).  . naproxen sodium (ALEVE) 220 MG tablet Take 220 mg by mouth 2 (two) times daily as needed (pain/headache).  . OVER THE COUNTER MEDICATION  Take 1 capsule by mouth at bedtime. Over the counter stool softener  . polyethylene glycol (MIRALAX / GLYCOLAX) packet Take 17 g by mouth daily as needed (constipation). Mix in 8 oz liquid and drink  . Probiotic Product (DIGESTIVE ADVANTAGE) CAPS Take 1 capsule by mouth daily.  . vitamin B-12 (CYANOCOBALAMIN) 1000 MCG tablet Take 1,000 mcg by mouth at bedtime.   . [DISCONTINUED] atorvastatin (LIPITOR) 20 MG tablet Take 1 tablet (20 mg total) by mouth daily.  . [DISCONTINUED] lisinopril (ZESTRIL) 10 MG tablet TAKE 1 TABLET BY MOUTH ONCE DAILY     Allergies:   Demerol   Social History   Socioeconomic History  . Marital status: Widowed    Spouse name: Not  on file  . Number of children: 3  . Years of education: college  . Highest education level: Not on file  Occupational History  . 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[employer]  Tobacco Use  . Smoking status: Never Smoker  . Smokeless tobacco: Never Used  Substance and Sexual Activity  . Alcohol use: No    Comment: glass of wine-special occasions  . Drug use: No  . Sexual activity: Never    Birth control/protection: Abstinence  Other Topics Concern  . Not on file  Social History Narrative  . Not on file   Social Determinants of Health   Financial Resource Strain:   . Difficulty of Paying Living Expenses: Not on file  Food Insecurity:   . Worried About Charity fundraiser in the Last Year: Not on file  . Ran Out of Food in the Last Year: Not on file  Transportation Needs:   . Lack of Transportation (Medical): Not on file  . Lack of Transportation (Non-Medical): Not on file  Physical Activity:   . Days of Exercise per Week: Not on file  . Minutes of Exercise per Session: Not on file  Stress:   . Feeling of Stress : Not on file  Social Connections:   . Frequency of Communication with Friends and Family: Not on file  . Frequency of Social Gatherings with Friends and Family: Not on file  . Attends Religious Services: Not on file  . Active Member of Clubs or Organizations: Not on file  . Attends Archivist Meetings: Not on file  . Marital Status: Not on file     Family History: The patient's family history includes Breast cancer in her sister; CAD in her sister and sister; Cancer in her brother, sister, sister, and another family member; Heart attack in her father; Heart disease in her brother, father, and mother; Hypertension in her daughter, mother, and another family member; Stroke in her mother.  ROS:   Please see the history of present illness.     All other systems reviewed and are negative.  EKGs/Labs/Other Studies Reviewed:    EKG:  EKG is not ordered  today.   Recent Labs: 10/21/2018: ALT 11; BUN 15; Creatinine, Ser 1.11; Potassium 4.8; Sodium 141   Recent Lipid Panel    Component Value Date/Time   CHOL 186 10/21/2018 0952   TRIG 72 10/21/2018 0952   HDL 57 10/21/2018 0952   CHOLHDL 3.3 10/21/2018 0952   CHOLHDL 2.5 12/13/2015 1102   VLDL 14 12/13/2015 1102   LDLCALC 116 (H) 10/21/2018 0952   Echo 09/2017: Study Conclusions   - Left ventricle: The cavity size was normal. There was mild  concentric hypertrophy. Systolic function was vigorous. The  estimated ejection fraction  was in the range of 65% to 70%. Wall  motion was normal; there were no regional wall motion  abnormalities. Doppler parameters are consistent with abnormal  left ventricular relaxation (grade 1 diastolic dysfunction).  - Aortic valve: Mild focal calcification involving the right  coronary and noncoronary cusp.  - Mitral valve: Moderate focal calcification of the posterior  leaflet. Minimal focal calcification of the anterior leaflet.  Mobility of the posterior leaflet was mildly restricted. The  findings are consistent with trivial stenosis. There was mild  regurgitation.  - Left atrium: The atrium was mildly dilated.   LHC 09/2017:  No CAD  09/01/2019: Sodium 140, potassium 4.7, BUN 24, creatinine 1.2 AST 20, ALT 13 WBC 8.3, hemoglobin 10.8, hematocrit 35.2, platelets 239  Physical Exam:    VS:  BP (!) 148/78   Pulse 69   Ht 5' 6.5" (1.689 m)   Wt 186 lb 9.6 oz (84.6 kg)   SpO2 99%   BMI 29.67 kg/m  , BMI Body mass index is 29.67 kg/m. GENERAL:  Well appearing HEENT: Pupils equal round and reactive, fundi not visualized, oral mucosa unremarkable NECK:  No jugular venous distention, waveform within normal limits, carotid upstroke brisk and symmetric, no bruits LUNGS:  Clear to auscultation bilaterally HEART:  RRR.  PMI not displaced or sustained,S1 and S2 within normal limits, no S3, no S4, no clicks, no rubs, no  murmurs ABD:  Flat, positive bowel sounds normal in frequency in pitch, no bruits, no rebound, no guarding, no midline pulsatile mass, no hepatomegaly, no splenomegaly EXT:  2 plus pulses throughout, no edema, no cyanosis no clubbing SKIN:  No rashes no nodules NEURO:  Cranial nerves II through XII grossly intact, motor grossly intact throughout PSYCH:  Cognitively intact, oriented to person place and time   ASSESSMENT:    1. Essential hypertension   2. Therapeutic drug monitoring   3. Edema, unspecified type   4. Bruit   5. Mixed hyperlipidemia   6. Lower extremity edema   7. Pure hypercholesterolemia   8. Bruit of right carotid artery     PLAN:    # Essential hypertension:  BP mildly elevated both initially and on repeat.  She already has a healthy diet and is starting to exercise more.  Given that she does have some mild edema will switch lisinopril to HCTZ 12.5 mg daily.  We will check a basic metabolic panel in a week.  We will also check a BNP at that time given her reports of edema.  She does not appear volume overloaded on exam and had a normal echo in 2019.  Therefore last BNP is elevated we will not repeat her echo at this time.  Check TSH.   # R carotid bruit:  Check carotid Dopplers.  Continue aspirin and atorvastatin.  # Pure hypercholesterolemia: Continue atorvastatin.  Check fasting lipids.   Disposition:    FU with MD/PharmD in 2 months    Medication Adjustments/Labs and Tests Ordered: Current medicines are reviewed at length with the patient today.  Concerns regarding medicines are outlined above.  Orders Placed This Encounter  Procedures  . TSH  . Lipid panel  . Basic metabolic panel  . B Nat Peptide  . VAS US CAROTID   Meds ordered this encounter  Medications  . hydrochlorothiazide (HYDRODIURIL) 12.5 MG tablet    Sig: Take 1 tablet (12.5 mg total) by mouth daily.    Dispense:  90 tablet    Refill:  3  Signed, Skeet Latch, MD   10/11/2019 3:11 PM    [place]

## 2019-10-19 LAB — LIPID PANEL
Chol/HDL Ratio: 6.2 ratio — ABNORMAL HIGH (ref 0.0–4.4)
Cholesterol, Total: 337 mg/dL — ABNORMAL HIGH (ref 100–199)
HDL: 54 mg/dL (ref >39)
LDL Chol Calc (NIH): 261 mg/dL — ABNORMAL HIGH (ref 0–99)
Triglycerides: 123 mg/dL (ref 0–149)
VLDL Cholesterol Cal: 22 mg/dL (ref 5–40)

## 2019-10-19 LAB — BASIC METABOLIC PANEL
BUN/Creatinine Ratio: 20 (ref 12–28)
BUN: 25 mg/dL (ref 8–27)
CO2: 25 mmol/L (ref 20–29)
Calcium: 9.4 mg/dL (ref 8.7–10.3)
Chloride: 101 mmol/L (ref 96–106)
Creatinine, Ser: 1.23 mg/dL — ABNORMAL HIGH (ref 0.57–1.00)
GFR calc Af Amer: 50 mL/min/{1.73_m2} — ABNORMAL LOW (ref >59)
GFR calc non Af Amer: 43 mL/min/{1.73_m2} — ABNORMAL LOW (ref >59)
Glucose: 97 mg/dL (ref 65–99)
Potassium: 5 mmol/L (ref 3.5–5.2)
Sodium: 138 mmol/L (ref 134–144)

## 2019-10-19 LAB — BRAIN NATRIURETIC PEPTIDE: BNP: 79.8 pg/mL (ref 0.0–100.0)

## 2019-10-19 LAB — TSH: TSH: 2.21 u[IU]/mL (ref 0.450–4.500)

## 2019-10-22 ENCOUNTER — Ambulatory Visit (HOSPITAL_COMMUNITY)
Admission: RE | Admit: 2019-10-22 | Discharge: 2019-10-22 | Disposition: A | Discharge status: Home / Self Care | Payer: Medicare Other | Source: Ambulatory Visit | Attending: Cardiovascular Disease | Admitting: Cardiovascular Disease

## 2019-10-22 ENCOUNTER — Other Ambulatory Visit: Payer: Self-pay

## 2019-10-22 DIAGNOSIS — R0989 Other specified symptoms and signs involving the circulatory and respiratory systems: Secondary | ICD-10-CM | POA: Diagnosis present

## 2019-10-26 ENCOUNTER — Ambulatory Visit (INDEPENDENT_AMBULATORY_CARE_PROVIDER_SITE_OTHER): Payer: Medicare Other | Admitting: Emergency Medicine

## 2019-10-26 ENCOUNTER — Encounter: Payer: Self-pay | Admitting: Emergency Medicine

## 2019-10-26 ENCOUNTER — Other Ambulatory Visit: Payer: Self-pay

## 2019-10-26 VITALS — BP 138/78 | HR 83 | Temp 98.2°F | Resp 15 | Ht 65.6 in | Wt 188.0 lb

## 2019-10-26 DIAGNOSIS — N1831 Chronic kidney disease, stage 3a: Secondary | ICD-10-CM

## 2019-10-26 DIAGNOSIS — I10 Essential (primary) hypertension: Secondary | ICD-10-CM | POA: Diagnosis not present

## 2019-10-26 DIAGNOSIS — R0989 Other specified symptoms and signs involving the circulatory and respiratory systems: Secondary | ICD-10-CM

## 2019-10-26 DIAGNOSIS — E785 Hyperlipidemia, unspecified: Secondary | ICD-10-CM

## 2019-10-26 DIAGNOSIS — Z7689 Persons encountering health services in other specified circumstances: Secondary | ICD-10-CM

## 2019-10-26 DIAGNOSIS — Z23 Encounter for immunization: Secondary | ICD-10-CM | POA: Diagnosis not present

## 2019-10-26 MED ORDER — ROSUVASTATIN CALCIUM 20 MG PO TABS: 20.0000 mg | ORAL_TABLET | Freq: Every day | ORAL | 3 refills | Status: DC

## 2019-10-26 MED ORDER — LISINOPRIL-HYDROCHLOROTHIAZIDE 10-12.5 MG PO TABS: 1.0000 | ORAL_TABLET | Freq: Every day | ORAL | 3 refills | Status: DC

## 2019-10-26 NOTE — Assessment & Plan Note (Signed)
Carotid Doppler ultrasound results reviewed with patient.  No significant obstruction.

## 2019-10-26 NOTE — Progress Notes (Signed)
Jo [name] 74 y.o.   Chief Complaint  Patient presents with  . Establish Care    pt was a stallings pt no concerns at this time     HISTORY OF PRESENT ILLNESS: This is a 74 y.o. female first visit with me, here to establish care.  Used to see Dr. Nolon Rod. Patient has history of hypertension.  Recent visit with cardiologist reviewed with patient.  She was switch from lisinopril to HCTZ 12.5 mg due to mild peripheral edema.  Blood pressures at home have remained similar to those in the office. Patient had a recent cardiac cath done in 2019 that showed no coronary artery disease.  Recent echocardiogram with normal ejection fraction.  Physical exam shows right carotid bruit.  Recent carotid Doppler ultrasound shows no significant obstruction. Recent lipid profile showed very elevated cholesterol.  Patient not taking atorvastatin as prescribed. Takes 1 baby aspirin daily. Fully vaccinated against Covid.  HPI   Prior to Admission medications   Medication Sig Start Date End Date Taking? Authorizing Provider  aspirin EC 81 MG tablet Take 81 mg by mouth at bedtime.   Yes [provider]  cholecalciferol (VITAMIN D) 1000 units tablet Take 5,000 Units by mouth at bedtime.    Yes [provider]  Ferrous Sulfate (IRON SLOW RELEASE PO) Take 1 tablet by mouth daily.   Yes [provider]  hydrochlorothiazide (HYDRODIURIL) 12.5 MG tablet Take 1 tablet (12.5 mg total) by mouth daily. 10/11/19  Yes Skeet Latch, MD  HYDROCORTISONE EX Apply 1 application topically daily as needed (itching/bug bites).   Yes [provider]  OVER THE COUNTER MEDICATION Take 1 capsule by mouth at bedtime. Over the counter stool softener   Yes [provider]  polyethylene glycol (MIRALAX / GLYCOLAX) packet Take 17 g by mouth daily as needed (constipation). Mix in 8 oz liquid and drink   Yes [provider]  Probiotic Product (DIGESTIVE ADVANTAGE) CAPS Take  1 capsule by mouth daily.   Yes [provider]  vitamin B-12 (CYANOCOBALAMIN) 1000 MCG tablet Take 1,000 mcg by mouth at bedtime.    Yes [provider]    Allergies  Allergen Reactions  . Demerol Nausea And Vomiting    Severe nausea and vomiting    Patient Active Problem List   Diagnosis Date Noted  . Lower extremity edema 10/11/2019  . Pure hypercholesterolemia 10/11/2019  . Carotid bruit 10/11/2019  . Chest pain 09/14/2017  . Impaired fasting glucose 12/18/2016  . Pre-diabetes 10/25/2014  . Hypertension   . Hyperlipidemia   . Reflux   . Mild anemia     Past Medical History:  Diagnosis Date  . Anemia   . Carotid bruit 10/11/2019  . Goiter   . Hyperlipidemia   . Hypertension   . Lower extremity edema 10/11/2019  . Menopause age 31  . Pure hypercholesterolemia 10/11/2019  . Reflux   . Spinal stenosis     Past Surgical History:  Procedure Laterality Date  . CERVICAL SPINE SURGERY  06/2010  . LEFT HEART CATH AND CORONARY ANGIOGRAPHY N/A 09/16/2017   Procedure: LEFT HEART CATH AND CORONARY ANGIOGRAPHY;  Surgeon: Dixie Dials, MD;  Location: [facility];  Service: Cardiovascular;  Laterality: N/A;    Social History   Socioeconomic History  . Marital status: Widowed    Spouse name: Not on file  . Number of children: 3  . Years of education: college  . Highest education level: Not on file  Occupational History  . 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[employer]  Tobacco Use  . Smoking status: Never Smoker  . Smokeless tobacco: Never Used  Substance and Sexual Activity  . Alcohol use: No    Comment: glass of wine-special occasions  . Drug use: No  . Sexual activity: Never    Birth control/protection: Abstinence  Other Topics Concern  . Not on file  Social History Narrative  . Not on file   Social Determinants of Health   Financial Resource Strain:   . Difficulty of Paying Living Expenses: Not on file  Food Insecurity:   . Worried  About Charity fundraiser in the Last Year: Not on file  . Ran Out of Food in the Last Year: Not on file  Transportation Needs:   . Lack of Transportation (Medical): Not on file  . Lack of Transportation (Non-Medical): Not on file  Physical Activity:   . Days of Exercise per Week: Not on file  . Minutes of Exercise per Session: Not on file  Stress:   . Feeling of Stress : Not on file  Social Connections:   . Frequency of Communication with Friends and Family: Not on file  . Frequency of Social Gatherings with Friends and Family: Not on file  . Attends Religious Services: Not on file  . Active Member of Clubs or Organizations: Not on file  . Attends Archivist Meetings: Not on file  . Marital Status: Not on file  Intimate Partner Violence:   . Fear of Current or Ex-Partner: Not on file  . Emotionally Abused: Not on file  . Physically Abused: Not on file  . Sexually Abused: Not on file    Family History  Problem Relation Age of Onset  . Stroke Mother   . Hypertension Mother   . Heart disease Mother   . Heart disease Father   . Heart attack Father   . Hypertension Daughter   . Cancer Sister   . Breast cancer Sister   . CAD Sister   . Cancer Brother   . Heart disease Brother   . Cancer Sister   . CAD Sister   . Hypertension Other   . Cancer Other        lung x 2     Review of Systems  Constitutional: Negative.  Negative for chills and fever.  HENT: Negative.  Negative for congestion and sore throat.   Eyes: Negative.   Respiratory: Negative.  Negative for shortness of breath.   Cardiovascular: Negative.  Negative for chest pain and palpitations.  Gastrointestinal: Negative.  Negative for abdominal pain, diarrhea, nausea and vomiting.  Genitourinary: Negative.  Negative for dysuria and hematuria.  Musculoskeletal: Negative.  Negative for back pain, myalgias and neck pain.  Skin: Negative.  Negative for rash.  Neurological: Negative.  Negative for dizziness,  sensory change, focal weakness and headaches.  All other systems reviewed and are negative.  Today's Vitals   10/26/19 1017 10/26/19 1021  BP: (!) 144/80 138/78  Pulse: 83   Resp: 15   Temp: 98.2 F (36.8 C)   TempSrc: Temporal   SpO2: 98%   Weight: 188 lb (85.3 kg)   Height: 5' 5.6" (1.666 m)    Body mass index is 30.72 kg/m.   Physical Exam Vitals reviewed.  Constitutional:      Appearance: Normal appearance.  HENT:     Head: Normocephalic.  Eyes:     Extraocular Movements: Extraocular movements intact.  Conjunctiva/sclera: Conjunctivae normal.     Pupils: Pupils are equal, round, and reactive to light.  Neck:     Vascular: Carotid bruit (Right sided) present.  Cardiovascular:     Rate and Rhythm: Normal rate and regular rhythm.     Pulses: Normal pulses.     Heart sounds: Normal heart sounds.  Pulmonary:     Effort: Pulmonary effort is normal.     Breath sounds: Normal breath sounds.  Musculoskeletal:        General: Normal range of motion.     Cervical back: Normal range of motion and neck supple.     Right lower leg: No edema.     Left lower leg: No edema.  Skin:    General: Skin is warm and dry.     Capillary Refill: Capillary refill takes less than 2 seconds.  Neurological:     General: No focal deficit present.     Mental Status: She is alert and oriented to person, place, and time.  Psychiatric:        Mood and Affect: Mood normal.        Behavior: Behavior normal.    A total of 45 minutes was spent with the patient establishing care, greater than 50% of which was in counseling/coordination of care regarding multiple chronic medical conditions including hypertension and dyslipidemia as well as chronic kidney disease, cardiovascular risks associated with these conditions, review of all medications, review of most recent specialists office visit notes, review of most recent blood work results and abnormal lipid profile, need to be on a statin regimen,  Crestor 20 mg daily, review of recent carotid Doppler ultrasound, review of recent echocardiogram results, health maintenance items, education and nutrition, documentation, prognosis, and need for follow-up.   ASSESSMENT & PLAN:  Essential hypertension Persistently elevated systolic blood pressure.  Also has chronic kidney disease stage III a as per recent blood work.  Presently on hydrochlorothiazide 12.5 mg daily.  Will change to combo pill lisinopril-HCTZ 10-12.5 mg daily. Diet and nutrition discussed. Follow-up in 6 months.  Dyslipidemia Very abnormal lipid profile.  Patient has been off atorvastatin.  We will start rosuvastatin 20 mg daily. Diet and nutrition discussed. Return in 6 months.  Carotid bruit Carotid Doppler ultrasound results reviewed with patient.  No significant obstruction.  Stage 3a chronic kidney disease (HCC) Stable chronic kidney disease.  Advised to stay well-hydrated and avoid NSAIDs.  Will add ACE inhibitor to antihypertensive therapy.   Kysha was seen today for establish care.  Diagnoses and all orders for this visit:  Essential hypertension -     lisinopril-hydrochlorothiazide (ZESTORETIC) 10-12.5 MG tablet; Take 1 tablet by mouth daily.  Dyslipidemia -     rosuvastatin (CRESTOR) 20 MG tablet; Take 1 tablet (20 mg total) by mouth daily.  Stage 3a chronic kidney disease ([place])  Need for prophylactic vaccination and inoculation against influenza -     Flu Vaccine QUAD High Dose(Fluad)  Encounter to establish care  Bruit of right carotid artery    Patient Instructions       If you have lab work done today you will be contacted with your lab results within the next 2 weeks.  If you have not heard from [place] then please contact us. The fastest way to get your results is to register for My Chart.   IF you received an x-ray today, you will receive an invoice from [facility]. Please contact [facility] at [phone] with  questions or concerns  regarding your invoice.   IF you received labwork today, you will receive an invoice from [facility]. Please contact [facility] at [phone] with questions or concerns regarding your invoice.   Our billing staff will not be able to assist you with questions regarding bills from these companies.  You will be contacted with the lab results as soon as they are available. The fastest way to get your results is to activate your My Chart account. Instructions are located on the last page of this paperwork. If you have not heard from [place] regarding the results in 2 weeks, please contact this office.     Health Maintenance After Age 71 After age 21, you are at a higher risk for certain long-term diseases and infections as well as injuries from falls. Falls are a major cause of broken bones and head injuries in people who are older than age 62. Getting regular preventive care can help to keep you healthy and well. Preventive care includes getting regular testing and making lifestyle changes as recommended by your health care provider. Talk with your health care provider about:  Which screenings and tests you should have. A screening is a test that checks for a disease when you have no symptoms.  A diet and exercise plan that is right for you. What should I know about screenings and tests to prevent falls? Screening and testing are the best ways to find a health problem early. Early diagnosis and treatment give you the best chance of managing medical conditions that are common after age 35. Certain conditions and lifestyle choices may make you more likely to have a fall. Your health care provider may recommend:  Regular vision checks. Poor vision and conditions such as cataracts can make you more likely to have a fall. If you wear glasses, make sure to get your prescription updated if your vision changes.  Medicine review. Work with your health care provider to regularly review all of  the medicines you are taking, including over-the-counter medicines. Ask your health care provider about any side effects that may make you more likely to have a fall. Tell your health care provider if any medicines that you take make you feel dizzy or sleepy.  Osteoporosis screening. Osteoporosis is a condition that causes the bones to get weaker. This can make the bones weak and cause them to break more easily.  Blood pressure screening. Blood pressure changes and medicines to control blood pressure can make you feel dizzy.  Strength and balance checks. Your health care provider may recommend certain tests to check your strength and balance while standing, walking, or changing positions.  Foot health exam. Foot pain and numbness, as well as not wearing proper footwear, can make you more likely to have a fall.  Depression screening. You may be more likely to have a fall if you have a fear of falling, feel emotionally low, or feel unable to do activities that you used to do.  Alcohol use screening. Using too much alcohol can affect your balance and may make you more likely to have a fall. What actions can I take to lower my risk of falls? General instructions  Talk with your health care provider about your risks for falling. Tell your health care provider if: ? You fall. Be sure to tell your health care provider about all falls, even ones that seem minor. ? You feel dizzy, sleepy, or off-balance.  Take over-the-counter and prescription medicines only as told by your health care provider.  These include any supplements.  Eat a healthy diet and maintain a healthy weight. A healthy diet includes low-fat dairy products, low-fat (lean) meats, and fiber from whole grains, beans, and lots of fruits and vegetables. Home safety  Remove any tripping hazards, such as rugs, cords, and clutter.  Install safety equipment such as grab bars in bathrooms and safety rails on stairs.  Keep rooms and walkways  well-lit. Activity   Follow a regular exercise program to stay fit. This will help you maintain your balance. Ask your health care provider what types of exercise are appropriate for you.  If you need a cane or walker, use it as recommended by your health care provider.  Wear supportive shoes that have nonskid soles. Lifestyle  Do not drink alcohol if your health care provider tells you not to drink.  If you drink alcohol, limit how much you have: ? 0-1 drink a day for women. ? 0-2 drinks a day for men.  Be aware of how much alcohol is in your drink. In the [place] one drink equals one typical bottle of beer (12 oz), one-half glass of wine (5 oz), or one shot of hard liquor (1 oz).  Do not use any products that contain nicotine or tobacco, such as cigarettes and e-cigarettes. If you need help quitting, ask your health care provider. Summary  Having a healthy lifestyle and getting preventive care can help to protect your health and wellness after age 53.  Screening and testing are the best way to find a health problem early and help you avoid having a fall. Early diagnosis and treatment give you the best chance for managing medical conditions that are more common for people who are older than age 57.  Falls are a major cause of broken bones and head injuries in people who are older than age 51. Take precautions to prevent a fall at home.  Work with your health care provider to learn what changes you can make to improve your health and wellness and to prevent falls. This information is not intended to replace advice given to you by your health care provider. Make sure you discuss any questions you have with your health care provider. Document Revised: 04/23/2018 Document Reviewed: 11/13/2016 Elsevier Patient Education  2020 [employer].   .   Agustina Caroli, MD [facility]

## 2019-10-26 NOTE — Assessment & Plan Note (Signed)
Persistently elevated systolic blood pressure.  Also has chronic kidney disease stage III a as per recent blood work.  Presently on hydrochlorothiazide 12.5 mg daily.  Will change to combo pill lisinopril-HCTZ 10-12.5 mg daily. Diet and nutrition discussed. Follow-up in 6 months.

## 2019-10-26 NOTE — Patient Instructions (Addendum)
   If you have lab work done today you will be contacted with your lab results within the next 2 weeks.  If you have not heard from us then please contact us. The fastest way to get your results is to register for My Chart.   IF you received an x-ray today, you will receive an invoice from [facility]. Please contact [facility] at [phone] with questions or concerns regarding your invoice.   IF you received labwork today, you will receive an invoice from [facility]. Please contact [facility] at [phone] with questions or concerns regarding your invoice.   Our billing staff will not be able to assist you with questions regarding bills from these companies.  You will be contacted with the lab results as soon as they are available. The fastest way to get your results is to activate your My Chart account. Instructions are located on the last page of this paperwork. If you have not heard from us regarding the results in 2 weeks, please contact this office.     Health Maintenance After Age 65 After age 65, you are at a higher risk for certain long-term diseases and infections as well as injuries from falls. Falls are a major cause of broken bones and head injuries in people who are older than age 65. Getting regular preventive care can help to keep you healthy and well. Preventive care includes getting regular testing and making lifestyle changes as recommended by your health care provider. Talk with your health care provider about:  Which screenings and tests you should have. A screening is a test that checks for a disease when you have no symptoms.  A diet and exercise plan that is right for you. What should I know about screenings and tests to prevent falls? Screening and testing are the best ways to find a health problem early. Early diagnosis and treatment give you the best chance of managing medical conditions that are common after age 65. Certain conditions and  lifestyle choices may make you more likely to have a fall. Your health care provider may recommend:  Regular vision checks. Poor vision and conditions such as cataracts can make you more likely to have a fall. If you wear glasses, make sure to get your prescription updated if your vision changes.  Medicine review. Work with your health care provider to regularly review all of the medicines you are taking, including over-the-counter medicines. Ask your health care provider about any side effects that may make you more likely to have a fall. Tell your health care provider if any medicines that you take make you feel dizzy or sleepy.  Osteoporosis screening. Osteoporosis is a condition that causes the bones to get weaker. This can make the bones weak and cause them to break more easily.  Blood pressure screening. Blood pressure changes and medicines to control blood pressure can make you feel dizzy.  Strength and balance checks. Your health care provider may recommend certain tests to check your strength and balance while standing, walking, or changing positions.  Foot health exam. Foot pain and numbness, as well as not wearing proper footwear, can make you more likely to have a fall.  Depression screening. You may be more likely to have a fall if you have a fear of falling, feel emotionally low, or feel unable to do activities that you used to do.  Alcohol use screening. Using too much alcohol can affect your balance and may make you more likely to   have a fall. What actions can I take to lower my risk of falls? General instructions  Talk with your health care provider about your risks for falling. Tell your health care provider if: ? You fall. Be sure to tell your health care provider about all falls, even ones that seem minor. ? You feel dizzy, sleepy, or off-balance.  Take over-the-counter and prescription medicines only as told by your health care provider. These include any  supplements.  Eat a healthy diet and maintain a healthy weight. A healthy diet includes low-fat dairy products, low-fat (lean) meats, and fiber from whole grains, beans, and lots of fruits and vegetables. Home safety  Remove any tripping hazards, such as rugs, cords, and clutter.  Install safety equipment such as grab bars in bathrooms and safety rails on stairs.  Keep rooms and walkways well-lit. Activity   Follow a regular exercise program to stay fit. This will help you maintain your balance. Ask your health care provider what types of exercise are appropriate for you.  If you need a cane or walker, use it as recommended by your health care provider.  Wear supportive shoes that have nonskid soles. Lifestyle  Do not drink alcohol if your health care provider tells you not to drink.  If you drink alcohol, limit how much you have: ? 0-1 drink a day for women. ? 0-2 drinks a day for men.  Be aware of how much alcohol is in your drink. In the [place] one drink equals one typical bottle of beer (12 oz), one-half glass of wine (5 oz), or one shot of hard liquor (1 oz).  Do not use any products that contain nicotine or tobacco, such as cigarettes and e-cigarettes. If you need help quitting, ask your health care provider. Summary  Having a healthy lifestyle and getting preventive care can help to protect your health and wellness after age 65.  Screening and testing are the best way to find a health problem early and help you avoid having a fall. Early diagnosis and treatment give you the best chance for managing medical conditions that are more common for people who are older than age 65.  Falls are a major cause of broken bones and head injuries in people who are older than age 65. Take precautions to prevent a fall at home.  Work with your health care provider to learn what changes you can make to improve your health and wellness and to prevent falls. This information is not intended  to replace advice given to you by your health care provider. Make sure you discuss any questions you have with your health care provider. Document Revised: 04/23/2018 Document Reviewed: 11/13/2016 Elsevier Patient Education  2020 Elsevier Inc.  

## 2019-10-26 NOTE — Progress Notes (Signed)
BP Readings from Last 3 Encounters:  10/26/19 138/78  10/11/19 (!) 148/78  03/17/19 134/80

## 2019-10-26 NOTE — Assessment & Plan Note (Signed)
Very abnormal lipid profile.  Patient has been off atorvastatin.  We will start rosuvastatin 20 mg daily. Diet and nutrition discussed. Return in 6 months.

## 2019-10-26 NOTE — Assessment & Plan Note (Signed)
Stable chronic kidney disease.  Advised to stay well-hydrated and avoid NSAIDs.  Will add ACE inhibitor to antihypertensive therapy.

## 2019-11-02 ENCOUNTER — Telehealth: Payer: Self-pay | Admitting: Cardiovascular Disease

## 2019-11-02 DIAGNOSIS — E7800 Pure hypercholesterolemia, unspecified: Secondary | ICD-10-CM

## 2019-11-02 DIAGNOSIS — I10 Essential (primary) hypertension: Secondary | ICD-10-CM

## 2019-11-02 DIAGNOSIS — Z5181 Encounter for therapeutic drug level monitoring: Secondary | ICD-10-CM

## 2019-11-02 NOTE — Telephone Encounter (Signed)
Thank you :)

## 2019-11-02 NOTE — Telephone Encounter (Signed)
Patient returning call for lab results. 

## 2019-11-02 NOTE — Telephone Encounter (Addendum)
Returned call to patient to review lab results. She states her PCP, Dr. Mitchel Honour changed her cholesterol medication to rosuvastatin. She does not have lab work scheduled for follow-up. Advised her to come to our office in 3 months for repeat cholesterol, liver, and kidney function tests. She verbalized understanding and agreement and asked that Dr. Oval Linsey review the note from her visit with Dr. Mitchel Honour because he also changed her BP med.  She thanked me for the call.

## 2019-12-15 ENCOUNTER — Ambulatory Visit (INDEPENDENT_AMBULATORY_CARE_PROVIDER_SITE_OTHER): Payer: Medicare Other | Admitting: Cardiovascular Disease

## 2019-12-15 ENCOUNTER — Encounter: Payer: Self-pay | Admitting: Cardiovascular Disease

## 2019-12-15 ENCOUNTER — Other Ambulatory Visit: Payer: Self-pay

## 2019-12-15 VITALS — BP 128/78 | HR 80 | Ht 67.0 in | Wt 182.0 lb

## 2019-12-15 DIAGNOSIS — E785 Hyperlipidemia, unspecified: Secondary | ICD-10-CM | POA: Diagnosis not present

## 2019-12-15 DIAGNOSIS — E7800 Pure hypercholesterolemia, unspecified: Secondary | ICD-10-CM | POA: Diagnosis not present

## 2019-12-15 DIAGNOSIS — I10 Essential (primary) hypertension: Secondary | ICD-10-CM | POA: Diagnosis not present

## 2019-12-15 DIAGNOSIS — N1831 Chronic kidney disease, stage 3a: Secondary | ICD-10-CM | POA: Diagnosis not present

## 2019-12-15 NOTE — Patient Instructions (Signed)
Medication Instructions:  Your physician recommends that you continue on your current medications as directed. Please refer to the Current Medication list given to you today.  *If you need a refill on your cardiac medications before your next appointment, please call your pharmacy*  Lab Work: NONE  Testing/Procedures: NONE  Follow-Up: At [facility], you and your health needs are our priority.  As part of our continuing mission to provide you with exceptional heart care, we have created designated Provider Care Teams.  These Care Teams include your primary Cardiologist (physician) and Advanced Practice Providers (APPs -  Physician Assistants and Nurse Practitioners) who all work together to provide you with the care you need, when you need it.  We recommend signing up for the patient portal called "MyChart".  Sign up information is provided on this After Visit Summary.  MyChart is used to connect with patients for Virtual Visits (Telemedicine).  Patients are able to view lab/test results, encounter notes, upcoming appointments, etc.  Non-urgent messages can be sent to your provider as well.   To learn more about what you can do with MyChart, go to [email or web address].    Your next appointment:   6 month(s)  The format for your next appointment:   In Person  Provider:   You may see Tiffany Carlyle, MD or one of the following Advanced Practice Providers on your designated Care Team:    Luke Kilroy, PA-C  Callie Goodrich, PA-C  Jesse Cleaver, FNP   

## 2019-12-15 NOTE — Progress Notes (Signed)
Hypertension Clinic follow-up assessment:    Date:  12/15/2019   ID:  Jo [name], DOB November 01, 1945, MRN [id number]  PCP:  Horald Pollen, MD  Cardiologist:  Skeet Latch, MD  Nephrologist:  Referring MD: Carylon Perches, NP   CC: Hypertension  History of Present Illness:    Jo [name] is a 74 y.o. female with a hx of hypertension, hyperlipidemia, and pre-diabetes here for follow-up.  She was initially seen 09/2019 to establish care in the hypertension clinic.  She previously saw Dr. Doylene Canard 09/2017.  At the time she had chest pain.  She had an echo at that time that revealed LVEF 65 to 70% with mild LVH and grade 1 diastolic dysfunction.  Mild calcification was noted of the aortic valve but there was no aortic stenosis.  She also has significant mitral annular calcification.  She had a left heart catheterization that showed no coronary artery disease.    [name] was diagnosed with hypertension about 20 years ago.  At her last appointment her blood pressure was slightly above goal.  She also had some edema.  She was switched from lisinopril to hydrochlorothiazide.  Since that time her primary care doctor also started her on rosuvastatin.  She had carotid Dopplers 10/2019 that revealed mild ICA stenosis bilaterally.  She tried going to the gym and irritated her R knee.  She also struggles with constipation.  She struggles with nocturia.  She no longer has edema.  She gets exercise walking with her puppies.  At home her BP has been well-controlled.  They have all been <130/80.  She has also been eating well. She has also lost 6 lb.    Previous antihypertensives: Lisinopril   Past Medical History:  Diagnosis Date  . Anemia   . Carotid bruit 10/11/2019  . Carotid stenosis 10/11/2019  . Goiter   . Hyperlipidemia   . Hypertension   . Lower extremity edema 10/11/2019  . Menopause age 19  . Pure hypercholesterolemia 10/11/2019  . Reflux   . Spinal stenosis      Past Surgical History:  Procedure Laterality Date  . CERVICAL SPINE SURGERY  06/2010  . LEFT HEART CATH AND CORONARY ANGIOGRAPHY N/A 09/16/2017   Procedure: LEFT HEART CATH AND CORONARY ANGIOGRAPHY;  Surgeon: Dixie Dials, MD;  Location: [facility];  Service: Cardiovascular;  Laterality: N/A;    Current Medications: Current Meds  Medication Sig  . aspirin EC 81 MG tablet Take 81 mg by mouth at bedtime.  . cholecalciferol (VITAMIN D) 1000 units tablet Take 5,000 Units by mouth at bedtime.   . Ferrous Sulfate (IRON SLOW RELEASE PO) Take 1 tablet by mouth daily.  [place] Kitchen HYDROCORTISONE EX Apply 1 application topically daily as needed (itching/bug bites).  [place] Kitchen lisinopril-hydrochlorothiazide (ZESTORETIC) 10-12.5 MG tablet Take 1 tablet by mouth daily.  [place] Kitchen OVER THE COUNTER MEDICATION Take 1 capsule by mouth at bedtime. Over the counter stool softener  . polyethylene glycol (MIRALAX / GLYCOLAX) packet Take 17 g by mouth daily as needed (constipation). Mix in 8 oz liquid and drink  . Probiotic Product (DIGESTIVE ADVANTAGE) CAPS Take 1 capsule by mouth daily.  . rosuvastatin (CRESTOR) 20 MG tablet Take 1 tablet (20 mg total) by mouth daily.  . vitamin B-12 (CYANOCOBALAMIN) 1000 MCG tablet Take 1,000 mcg by mouth at bedtime.      Allergies:   Demerol   Social History   Socioeconomic History  . Marital status: Widowed  Spouse name: Not on file  . Number of children: 3  . Years of education: college  . Highest education level: Not on file  Occupational History  . 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[employer]  Tobacco Use  . Smoking status: Never Smoker  . Smokeless tobacco: Never Used  Substance and Sexual Activity  . Alcohol use: No    Comment: glass of wine-special occasions  . Drug use: No  . Sexual activity: Never    Birth control/protection: Abstinence  Other Topics Concern  . Not on file  Social History Narrative  . Not on file   Social Determinants of Health    Financial Resource Strain:   . Difficulty of Paying Living Expenses: Not on file  Food Insecurity:   . Worried About Charity fundraiser in the Last Year: Not on file  . Ran Out of Food in the Last Year: Not on file  Transportation Needs:   . Lack of Transportation (Medical): Not on file  . Lack of Transportation (Non-Medical): Not on file  Physical Activity:   . Days of Exercise per Week: Not on file  . Minutes of Exercise per Session: Not on file  Stress:   . Feeling of Stress : Not on file  Social Connections:   . Frequency of Communication with Friends and Family: Not on file  . Frequency of Social Gatherings with Friends and Family: Not on file  . Attends Religious Services: Not on file  . Active Member of Clubs or Organizations: Not on file  . Attends Archivist Meetings: Not on file  . Marital Status: Not on file     Family History: The patient's family history includes Breast cancer in her sister; CAD in her sister and sister; Cancer in her brother, sister, sister, and another family member; Heart attack in her father; Heart disease in her brother, father, and mother; Hypertension in her daughter, mother, and another family member; Stroke in her mother.  ROS:   Please see the history of present illness.     All other systems reviewed and are negative.  EKGs/Labs/Other Studies Reviewed:    EKG:  EKG is not ordered today.   Recent Labs: 10/18/2019: BNP 79.8; BUN 25; Creatinine, Ser 1.23; Potassium 5.0; Sodium 138; TSH 2.210   Recent Lipid Panel    Component Value Date/Time   CHOL 337 (H) 10/18/2019 1036   TRIG 123 10/18/2019 1036   HDL 54 10/18/2019 1036   CHOLHDL 6.2 (H) 10/18/2019 1036   CHOLHDL 2.5 12/13/2015 1102   VLDL 14 12/13/2015 1102   LDLCALC 261 (H) 10/18/2019 1036   Echo 09/2017: Study Conclusions   - Left ventricle: The cavity size was normal. There was mild  concentric hypertrophy. Systolic function was vigorous. The  estimated  ejection fraction was in the range of 65% to 70%. Wall  motion was normal; there were no regional wall motion  abnormalities. Doppler parameters are consistent with abnormal  left ventricular relaxation (grade 1 diastolic dysfunction).  - Aortic valve: Mild focal calcification involving the right  coronary and noncoronary cusp.  - Mitral valve: Moderate focal calcification of the posterior  leaflet. Minimal focal calcification of the anterior leaflet.  Mobility of the posterior leaflet was mildly restricted. The  findings are consistent with trivial stenosis. There was mild  regurgitation.  - Left atrium: The atrium was mildly dilated.   LHC 09/2017:  No CAD  09/01/2019: Sodium 140, potassium 4.7, BUN 24, creatinine 1.2  AST 20, ALT 13 WBC 8.3, hemoglobin 10.8, hematocrit 35.2, platelets 239  Carotid Doppler 10/22/19: 1-39% ICA stenosis bilaterally.  Physical Exam:    VS:  BP 128/78   Pulse 80   Ht 5\' 7"  (1.702 m)   Wt 182 lb (82.6 kg)   SpO2 98%   BMI 28.51 kg/m  , BMI Body mass index is 28.51 kg/m. GENERAL:  Well appearing HEENT: Pupils equal round and reactive, fundi not visualized, oral mucosa unremarkable NECK:  No jugular venous distention, waveform within normal limits, carotid upstroke brisk and symmetric, no bruits LUNGS:  Clear to auscultation bilaterally HEART:  RRR.  PMI not displaced or sustained,S1 and S2 within normal limits, no S3, no S4, no clicks, no rubs, no murmurs ABD:  Flat, positive bowel sounds normal in frequency in pitch, no bruits, no rebound, no guarding, no midline pulsatile mass, no hepatomegaly, no splenomegaly EXT:  2 plus pulses throughout, no edema, no cyanosis no clubbing SKIN:  No rashes no nodules NEURO:  Cranial nerves II through XII grossly intact, motor grossly intact throughout PSYCH:  Cognitively intact, oriented to person place and time   ASSESSMENT:    1. Essential hypertension   2. Stage 3a chronic kidney  disease ([place])   3. Dyslipidemia   4. Pure hypercholesterolemia     PLAN:    # Essential hypertension:  Blood pressure has been much better controlled.  She does have frequent nocturia.  We did offer to stop hydrochlorothiazide.  However she is very happy that she no longer has edema and wants to continue with her current regimen.  Continue lisinopril and HCTZ.  # Mild carotid stenosis:  Mild stenosis noted 10/2019 bilaterally.  Continue aspirin and rosuvastatin.  # Pure hypercholesterolemia: She was switched from atorvastatin to rosuvastatin.  She will need fasting lipids and a CMP in 3 months.   Disposition:    FU with MD/PharmD in 6 months    Medication Adjustments/Labs and Tests Ordered: Current medicines are reviewed at length with the patient today.  Concerns regarding medicines are outlined above.  No orders of the defined types were placed in this encounter.  No orders of the defined types were placed in this encounter.    Signed, Skeet Latch, MD  12/15/2019 12:35 PM    [facility]

## 2020-05-22 ENCOUNTER — Encounter: Payer: Self-pay | Admitting: Emergency Medicine

## 2020-05-22 ENCOUNTER — Other Ambulatory Visit: Payer: Self-pay

## 2020-05-22 ENCOUNTER — Ambulatory Visit (INDEPENDENT_AMBULATORY_CARE_PROVIDER_SITE_OTHER): Payer: Medicare Other | Admitting: Emergency Medicine

## 2020-05-22 VITALS — BP 126/82 | HR 78 | Temp 98.0°F | Ht 67.0 in | Wt 173.0 lb

## 2020-05-22 DIAGNOSIS — E785 Hyperlipidemia, unspecified: Secondary | ICD-10-CM | POA: Diagnosis not present

## 2020-05-22 DIAGNOSIS — G8929 Other chronic pain: Secondary | ICD-10-CM

## 2020-05-22 DIAGNOSIS — Z1211 Encounter for screening for malignant neoplasm of colon: Secondary | ICD-10-CM | POA: Insufficient documentation

## 2020-05-22 DIAGNOSIS — N1831 Chronic kidney disease, stage 3a: Secondary | ICD-10-CM

## 2020-05-22 DIAGNOSIS — I10 Essential (primary) hypertension: Secondary | ICD-10-CM | POA: Diagnosis not present

## 2020-05-22 DIAGNOSIS — R1032 Left lower quadrant pain: Secondary | ICD-10-CM

## 2020-05-22 DIAGNOSIS — M25561 Pain in right knee: Secondary | ICD-10-CM

## 2020-05-22 DIAGNOSIS — R109 Unspecified abdominal pain: Secondary | ICD-10-CM | POA: Diagnosis not present

## 2020-05-22 DIAGNOSIS — R7303 Prediabetes: Secondary | ICD-10-CM

## 2020-05-22 LAB — COMPREHENSIVE METABOLIC PANEL
ALT: 13 U/L (ref 0–35)
AST: 18 U/L (ref 0–37)
Albumin: 3.9 g/dL (ref 3.5–5.2)
Alkaline Phosphatase: 47 U/L (ref 39–117)
BUN: 36 mg/dL — ABNORMAL HIGH (ref 6–23)
CO2: 30 mEq/L (ref 19–32)
Calcium: 10.5 mg/dL (ref 8.4–10.5)
Chloride: 104 mEq/L (ref 96–112)
Creatinine, Ser: 1.51 mg/dL — ABNORMAL HIGH (ref 0.40–1.20)
GFR: 33.45 mL/min — ABNORMAL LOW (ref >60.00)
Glucose, Bld: 89 mg/dL (ref 70–99)
Potassium: 5.4 mEq/L — ABNORMAL HIGH (ref 3.5–5.1)
Sodium: 140 mEq/L (ref 135–145)
Total Bilirubin: 0.4 mg/dL (ref 0.2–1.2)
Total Protein: 7.4 g/dL (ref 6.0–8.3)

## 2020-05-22 LAB — CBC WITH DIFFERENTIAL/PLATELET
Basophils Absolute: 0 10*3/uL (ref 0.0–0.1)
Basophils Relative: 0.3 % (ref 0.0–3.0)
Eosinophils Absolute: 0.2 10*3/uL (ref 0.0–0.7)
Eosinophils Relative: 2.5 % (ref 0.0–5.0)
HCT: 31.4 % — ABNORMAL LOW (ref 36.0–46.0)
Hemoglobin: 10.5 g/dL — ABNORMAL LOW (ref 12.0–15.0)
Lymphocytes Relative: 31.8 % (ref 12.0–46.0)
Lymphs Abs: 2.8 10*3/uL (ref 0.7–4.0)
MCHC: 33.3 g/dL (ref 30.0–36.0)
MCV: 87 fl (ref 78.0–100.0)
Monocytes Absolute: 0.9 10*3/uL (ref 0.1–1.0)
Monocytes Relative: 10.4 % (ref 3.0–12.0)
Neutro Abs: 4.8 10*3/uL (ref 1.4–7.7)
Neutrophils Relative %: 55 % (ref 43.0–77.0)
Platelets: 200 10*3/uL (ref 150.0–400.0)
RBC: 3.61 Mil/uL — ABNORMAL LOW (ref 3.87–5.11)
RDW: 14.4 % (ref 11.5–15.5)
WBC: 8.7 10*3/uL (ref 4.0–10.5)

## 2020-05-22 LAB — URINALYSIS, ROUTINE W REFLEX MICROSCOPIC
Bilirubin Urine: NEGATIVE
Ketones, ur: NEGATIVE
Leukocytes,Ua: NEGATIVE
Nitrite: NEGATIVE
RBC / HPF: NONE SEEN (ref 0–?)
Specific Gravity, Urine: 1.025 (ref 1.000–1.030)
Total Protein, Urine: >=300 — AB
Urine Glucose: NEGATIVE
Urobilinogen, UA: 0.2 (ref 0.0–1.0)
pH: 6 (ref 5.0–8.0)

## 2020-05-22 MED ORDER — TRAMADOL HCL 50 MG PO TABS: 50.0000 mg | ORAL_TABLET | Freq: Three times a day (TID) | ORAL | 0 refills | Status: AC | PRN

## 2020-05-22 NOTE — Assessment & Plan Note (Signed)
Screening for colon cancer discussed.  Referred for colonoscopy.

## 2020-05-22 NOTE — Assessment & Plan Note (Signed)
Diet and nutrition discussed.  Continue rosuvastatin 20 mg daily. 

## 2020-05-22 NOTE — Patient Instructions (Signed)
Flank Pain, Adult Flank pain is pain in your side. The flank is the area of your side between your upper belly (abdomen) and your back. The pain may occur over a short time (acute), or it may be long-term or come back often (chronic). It may be mild or very bad. Pain in this area can be caused by many different things. Follow these instructions at home:  Drink enough fluid to keep your pee (urine) clear or pale yellow.  Rest as told by your doctor.  Take over-the-counter and prescription medicines only as told by your doctor.  Keep a journal to keep track of: ? What has caused your flank pain. ? What has made it feel better.  Keep all follow-up visits as told by your doctor. This is important.   Contact a doctor if:  Medicine does not help your pain.  You have new symptoms.  Your pain gets worse.  You have a fever.  Your symptoms last longer than 2-3 days.  You have trouble peeing.  You are peeing more often than normal. Get help right away if:  You have trouble breathing.  You are short of breath.  Your belly hurts, or it is swollen or red.  You feel sick to your stomach (nauseous).  You throw up (vomit).  You feel like you will pass out, or you do pass out (faint).  You have blood in your pee. Summary  Flank pain is pain in your side. The flank is the area of your side between your upper belly (abdomen) and your back.  Flank pain may occur over a short time (acute), or it may be long-term or come back often (chronic). It may be mild or very bad.  Pain in this area can be caused by many different things.  Contact your doctor if your symptoms get worse or they last longer than 2-3 days. This information is not intended to replace advice given to you by your health care provider. Make sure you discuss any questions you have with your health care provider. Document Revised: 09/24/2019 Document Reviewed: 09/24/2019 Elsevier Patient Education  2021 Elsevier  Inc.  

## 2020-05-22 NOTE — Progress Notes (Signed)
Jo [name] 75 y.o.   Chief Complaint  Patient presents with  . Flank Pain    Left side pain, pt states that it starts a s a dull pain, but when she moves side to side or push on her side it hurts.started last wed.   Most recent cardiology visit on 12/14/2020 as follows: ASSESSMENT:    1. Essential hypertension   2. Stage 3a chronic kidney disease ([place])   3. Dyslipidemia   4. Pure hypercholesterolemia     PLAN:    # Essential hypertension:  Blood pressure has been much better controlled.  She does have frequent nocturia.  We did offer to stop hydrochlorothiazide.  However she is very happy that she no longer has edema and wants to continue with her current regimen.  Continue lisinopril and HCTZ.  # Mild carotid stenosis:  Mild stenosis noted 10/2019 bilaterally.  Continue aspirin and rosuvastatin.  # Pure hypercholesterolemia: She was switched from atorvastatin to rosuvastatin.  She will need fasting lipids and a CMP in 3 months.   Disposition:    FU with MD/PharmD in 6 months    HISTORY OF PRESENT ILLNESS: This is a 75 y.o. female complaining of pain to left side of abdomen on lumbar area that started last Wednesday about 5 days ago.  States pain was initially on the left lower quadrant and moved to the left flank where it has stayed there constantly without any associated symptoms.  Denies fever, chills, nausea or vomiting, urinary symptoms, diarrhea.  Has some chronic constipation. Also complaining of chronic right knee pain starting to affect her right hip.  Needs Ortho referral. Also requesting colonoscopy referral. No other complaints or medical concerns today.  HPI   Prior to Admission medications   Medication Sig Start Date End Date Taking? Authorizing Provider  aspirin EC 81 MG tablet Take 81 mg by mouth at bedtime.   Yes [provider]  cholecalciferol (VITAMIN D) 1000 units tablet Take 5,000 Units by mouth at bedtime.    Yes [provider]  Ferrous Sulfate (IRON SLOW RELEASE PO) Take 1 tablet by mouth daily.   Yes [provider]  HYDROCORTISONE EX Apply 1 application topically daily as needed (itching/bug bites).   Yes [provider]  lisinopril-hydrochlorothiazide (ZESTORETIC) 10-12.5 MG tablet Take 1 tablet by mouth daily. 10/26/19  Yes Yassen Kinnett, Ines Bloomer, MD  OVER THE COUNTER MEDICATION Take 1 capsule by mouth at bedtime. Over the counter stool softener   Yes [provider]  polyethylene glycol (MIRALAX / GLYCOLAX) packet Take 17 g by mouth daily as needed (constipation). Mix in 8 oz liquid and drink   Yes [provider]  Probiotic Product (DIGESTIVE ADVANTAGE) CAPS Take 1 capsule by mouth daily.   Yes [provider]  rosuvastatin (CRESTOR) 20 MG tablet Take 1 tablet (20 mg total) by mouth daily. 10/26/19  Yes Nakesha Ebrahim, Ines Bloomer, MD  vitamin B-12 (CYANOCOBALAMIN) 1000 MCG tablet Take 1,000 mcg by mouth at bedtime.    Yes [provider]    Allergies  Allergen Reactions  . Demerol Nausea And Vomiting    Severe nausea and vomiting    Patient Active Problem List   Diagnosis Date Noted  . Stage 3a chronic kidney disease ([place]) 10/26/2019  . Lower extremity edema 10/11/2019  . Pure hypercholesterolemia 10/11/2019  . Carotid stenosis 10/11/2019  . Impaired fasting glucose 12/18/2016  . Pre-diabetes 10/25/2014  . Essential hypertension   . Dyslipidemia   .  Reflux   . Mild anemia     Past Medical History:  Diagnosis Date  . Anemia   . Carotid bruit 10/11/2019  . Carotid stenosis 10/11/2019  . Goiter   . Hyperlipidemia   . Hypertension   . Lower extremity edema 10/11/2019  . Menopause age 38  . Pure hypercholesterolemia 10/11/2019  . Reflux   . Spinal stenosis     Past Surgical History:  Procedure Laterality Date  . CERVICAL SPINE SURGERY  06/2010  . LEFT HEART CATH AND CORONARY ANGIOGRAPHY N/A 09/16/2017   Procedure: LEFT HEART CATH  AND CORONARY ANGIOGRAPHY;  Surgeon: Dixie Dials, MD;  Location: [facility];  Service: Cardiovascular;  Laterality: N/A;    Social History   Socioeconomic History  . Marital status: Widowed    Spouse name: Not on file  . Number of children: 3  . Years of education: college  . Highest education level: Not on file  Occupational History  . 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[employer]  Tobacco Use  . Smoking status: Never Smoker  . Smokeless tobacco: Never Used  Substance and Sexual Activity  . Alcohol use: No    Comment: glass of wine-special occasions  . Drug use: No  . Sexual activity: Never    Birth control/protection: Abstinence  Other Topics Concern  . Not on file  Social History Narrative  . Not on file   Social Determinants of Health   Financial Resource Strain: Not on file  Food Insecurity: Not on file  Transportation Needs: Not on file  Physical Activity: Not on file  Stress: Not on file  Social Connections: Not on file  Intimate Partner Violence: Not on file    Family History  Problem Relation Age of Onset  . Stroke Mother   . Hypertension Mother   . Heart disease Mother   . Heart disease Father   . Heart attack Father   . Hypertension Daughter   . Cancer Sister   . Breast cancer Sister   . CAD Sister   . Cancer Brother   . Heart disease Brother   . Cancer Sister   . CAD Sister   . Hypertension Other   . Cancer Other        lung x 2     Review of Systems  Constitutional: Negative.  Negative for chills and fever.  HENT: Negative.  Negative for congestion and sore throat.   Respiratory: Negative.  Negative for cough and shortness of breath.   Cardiovascular: Negative.  Negative for chest pain and palpitations.  Gastrointestinal: Positive for abdominal pain and constipation. Negative for blood in stool, diarrhea, melena, nausea and vomiting.  Genitourinary: Negative.  Negative for dysuria and hematuria.  Musculoskeletal: Positive for  back pain.  Skin: Negative.  Negative for rash.  Neurological: Negative.  Negative for dizziness and headaches.  All other systems reviewed and are negative.   Today's Vitals   05/22/20 1404  BP: 126/82  Pulse: 78  Temp: 98 F (36.7 C)  TempSrc: Oral  SpO2: 98%  Weight: 173 lb (78.5 kg)  Height: 5\' 7"  (1.702 m)   Body mass index is 27.1 kg/m. Wt Readings from Last 3 Encounters:  05/22/20 173 lb (78.5 kg)  12/15/19 182 lb (82.6 kg)  10/26/19 188 lb (85.3 kg)    Physical Exam Vitals reviewed.  Constitutional:      Appearance: Normal appearance.  HENT:     Head: Normocephalic.  Eyes:  Extraocular Movements: Extraocular movements intact.     Conjunctiva/sclera: Conjunctivae normal.     Pupils: Pupils are equal, round, and reactive to light.  Cardiovascular:     Rate and Rhythm: Normal rate and regular rhythm.     Pulses: Normal pulses.     Heart sounds: Normal heart sounds.  Pulmonary:     Effort: Pulmonary effort is normal.     Breath sounds: Normal breath sounds.  Abdominal:     General: Bowel sounds are normal. There is no distension.     Palpations: Abdomen is soft.     Tenderness: There is no abdominal tenderness. There is no right CVA tenderness or left CVA tenderness.  Musculoskeletal:        General: Normal range of motion.     Cervical back: Normal range of motion and neck supple.     Comments: Some tenderness noted to left posterior lower rib cage  Skin:    General: Skin is warm and dry.     Capillary Refill: Capillary refill takes less than 2 seconds.  Neurological:     General: No focal deficit present.     Mental Status: She is alert and oriented to person, place, and time.  Psychiatric:        Mood and Affect: Mood normal.        Behavior: Behavior normal.      ASSESSMENT & PLAN: A total of 30 minutes was spent with the patient and counseling/coordination of care regarding differential diagnosis of left flank pain including possibilities of  kidney stone and musculoskeletal origin, need for diagnostic work-up including urinalysis and blood work and imaging with CAT scan of abdomen and pelvis, review of most recent office visit notes, review of most recent blood work results, health maintenance items including need for colon cancer screening and colonoscopy, education on nutrition, review of all medications, prognosis, documentation and need for follow-up after CT scan of abdomen and pelvis is done.  We also discussed need to follow-up with orthopedist for chronic right knee pain which could be putting pressure on the right hip and lumbar spine.  Essential hypertension Well-controlled hypertension.  Continue Zestoretic 10-12.5 mg daily. Diet and nutrition discussed.  Flank pain Differential diagnosis discussed.  We will do urinalysis and blood work. Schedule CT scanning abdomen and pelvis to rule out kidney stone. Further imaging may be needed depending on results. ED precautions given.  Dyslipidemia Diet and nutrition discussed.  Continue rosuvastatin 20 mg daily.  Colon cancer screening Screening for colon cancer discussed.  Referred for colonoscopy.  Jo [name] was seen today for flank pain.  Diagnoses and all orders for this visit:  Flank pain -     CBC with Differential/Platelet -     Comprehensive metabolic panel -     Urinalysis -     traMADol (ULTRAM) 50 MG tablet; Take 1 tablet (50 mg total) by mouth every 8 (eight) hours as needed for up to 5 days.  Stage 3a chronic kidney disease ([facility])  Essential hypertension  Dyslipidemia  Pre-diabetes  Left lower quadrant abdominal pain -     CT Abdomen Pelvis Wo Contrast; Future  Colon cancer screening -     Ambulatory referral to Gastroenterology  Chronic pain of right knee -     Ambulatory referral to Orthopedic Surgery    Patient Instructions   Flank Pain, Adult Flank pain is pain in your side. The flank is the area of your side between your upper belly  (abdomen)  and your back. The pain may occur over a short time (acute), or it may be long-term or come back often (chronic). It may be mild or very bad. Pain in this area can be caused by many different things. Follow these instructions at home:  Drink enough fluid to keep your pee (urine) clear or pale yellow.  Rest as told by your doctor.  Take over-the-counter and prescription medicines only as told by your doctor.  Keep a journal to keep track of: ? What has caused your flank pain. ? What has made it feel better.  Keep all follow-up visits as told by your doctor. This is important.   Contact a doctor if:  Medicine does not help your pain.  You have new symptoms.  Your pain gets worse.  You have a fever.  Your symptoms last longer than 2-3 days.  You have trouble peeing.  You are peeing more often than normal. Get help right away if:  You have trouble breathing.  You are short of breath.  Your belly hurts, or it is swollen or red.  You feel sick to your stomach (nauseous).  You throw up (vomit).  You feel like you will pass out, or you do pass out (faint).  You have blood in your pee. Summary  Flank pain is pain in your side. The flank is the area of your side between your upper belly (abdomen) and your back.  Flank pain may occur over a short time (acute), or it may be long-term or come back often (chronic). It may be mild or very bad.  Pain in this area can be caused by many different things.  Contact your doctor if your symptoms get worse or they last longer than 2-3 days. This information is not intended to replace advice given to you by your health care provider. Make sure you discuss any questions you have with your health care provider. Document Revised: 09/24/2019 Document Reviewed: 09/24/2019 Elsevier Patient Education  2021 [place], MD [facility]

## 2020-05-22 NOTE — Assessment & Plan Note (Signed)
Differential diagnosis discussed.  We will do urinalysis and blood work. Schedule CT scanning abdomen and pelvis to rule out kidney stone. Further imaging may be needed depending on results. ED precautions given.

## 2020-05-22 NOTE — Assessment & Plan Note (Signed)
Well-controlled hypertension.  Continue Zestoretic 10-12.5 mg daily. Diet and nutrition discussed.

## 2020-05-23 ENCOUNTER — Other Ambulatory Visit: Payer: Self-pay | Admitting: Emergency Medicine

## 2020-05-23 DIAGNOSIS — N1832 Chronic kidney disease, stage 3b: Secondary | ICD-10-CM

## 2020-05-23 NOTE — Progress Notes (Signed)
Call patient please.  Blood work shows worsening renal failure with anemia and mild elevation of potassium.  Needs nephrology evaluation.  Referral placed today.

## 2020-05-24 ENCOUNTER — Telehealth: Payer: Self-pay | Admitting: Emergency Medicine

## 2020-05-24 ENCOUNTER — Ambulatory Visit (INDEPENDENT_AMBULATORY_CARE_PROVIDER_SITE_OTHER): Payer: Medicare Other | Admitting: Orthopaedic Surgery

## 2020-05-24 ENCOUNTER — Encounter: Payer: Self-pay | Admitting: Orthopaedic Surgery

## 2020-05-24 ENCOUNTER — Ambulatory Visit (INDEPENDENT_AMBULATORY_CARE_PROVIDER_SITE_OTHER): Payer: Medicare Other

## 2020-05-24 ENCOUNTER — Other Ambulatory Visit: Payer: Self-pay

## 2020-05-24 VITALS — Ht 67.0 in | Wt 173.0 lb

## 2020-05-24 DIAGNOSIS — G8929 Other chronic pain: Secondary | ICD-10-CM | POA: Diagnosis not present

## 2020-05-24 DIAGNOSIS — M25561 Pain in right knee: Secondary | ICD-10-CM

## 2020-05-24 DIAGNOSIS — M1711 Unilateral primary osteoarthritis, right knee: Secondary | ICD-10-CM | POA: Diagnosis not present

## 2020-05-24 NOTE — Telephone Encounter (Signed)
Called and spoke to pt, she verbalized understanding.

## 2020-05-24 NOTE — Progress Notes (Signed)
Office Visit Note   Patient: Jo [name]           Date of Birth: [birth year]           MRN: [id number] Visit Date: 05/24/2020              Requested by: Horald Pollen, [place],  [place] [id number] PCP: Horald Pollen, MD   Assessment & Plan: Visit Diagnoses:  1. Chronic pain of right knee   2. Unilateral primary osteoarthritis, right knee     Plan: Osteoarthritis right knee predominantly involving the medial compartment.  Long discussion regarding treatment options including use of over-the-counter medicines, bracing exercises and Voltaren gel.  Not symptomatic enough to consider cortisone or viscosupplementation at this point.  All questions were answered. Mrs. Jo [name] will continue pursuing the nonoperative treatment  Follow-Up Instructions: Return if symptoms worsen or fail to improve.   Orders:  Orders Placed This Encounter  Procedures  . XR KNEE 3 VIEW RIGHT   No orders of the defined types were placed in this encounter.     Procedures: No procedures performed   Clinical Data: No additional findings.   Subjective: Chief Complaint  Patient presents with  . Right Knee - Pain  Patient presents today for right knee pain. She said that it started hurting on and off, but has worsened with time. She thinks it started about a month ago. No known injury. She can feel it click. Her knee feels weak, but has not given way. Most of the pain is located anteriorly. No swelling that she has noticed. She takes Tylenol as needed. No previous knee surgery.   HPI  Review of Systems   Objective: Vital Signs: Ht 5\' 7"  (1.702 m)   Wt 173 lb (78.5 kg)   BMI 27.10 kg/m   Physical Exam Constitutional:      Appearance: She is well-developed.  Eyes:     Pupils: Pupils are equal, round, and reactive to light.  Pulmonary:     Effort: Pulmonary effort is normal.  Skin:    General: Skin is warm and dry.  Neurological:     Mental Status:  She is alert and oriented to person, place, and time.  Psychiatric:        Behavior: Behavior normal.     Ortho Exam right knee was not hot warm red or swollen.  No effusion.  Full extension and flex to least 110 degrees without instability.  Very mild diffuse medial joint tenderness.  No pain laterally no popliteal pain or mass.  No calf pain  Specialty Comments:  No specialty comments available.  Imaging: XR KNEE 3 VIEW RIGHT  Result Date: 05/24/2020 Films of the right knee were obtained in 3 projections standing.  There is very mild decrease in the medial joint space with very minimal osteophyte formation and minimal subchondral sclerosis.  No cyst formation.  No ectopic calcification.  Lateral joint space appears relatively clear.  Some mild changes of arthritis beneath the patella.  Films are consistent with moderate osteoarthritis predominantly involving the medial compartment    PMFS History: Patient Active Problem List   Diagnosis Date Noted  . Unilateral primary osteoarthritis, right knee 05/24/2020  . Flank pain 05/22/2020  . Colon cancer screening 05/22/2020  . Stage 3a chronic kidney disease ([place]) 10/26/2019  . Pure hypercholesterolemia 10/11/2019  . Carotid stenosis 10/11/2019  . Impaired fasting glucose 12/18/2016  . Pre-diabetes 10/25/2014  . Essential hypertension   .  Dyslipidemia   . Reflux   . Mild anemia    Past Medical History:  Diagnosis Date  . Anemia   . Carotid bruit 10/11/2019  . Carotid stenosis 10/11/2019  . Goiter   . Hyperlipidemia   . Hypertension   . Lower extremity edema 10/11/2019  . Menopause age 48  . Pure hypercholesterolemia 10/11/2019  . Reflux   . Spinal stenosis     Family History  Problem Relation Age of Onset  . Stroke Mother   . Hypertension Mother   . Heart disease Mother   . Heart disease Father   . Heart attack Father   . Hypertension Daughter   . Cancer Sister   . Breast cancer Sister   . CAD Sister   . Cancer  Brother   . Heart disease Brother   . Cancer Sister   . CAD Sister   . Hypertension Other   . Cancer Other        lung x 2    Past Surgical History:  Procedure Laterality Date  . CERVICAL SPINE SURGERY  06/2010  . LEFT HEART CATH AND CORONARY ANGIOGRAPHY N/A 09/16/2017   Procedure: LEFT HEART CATH AND CORONARY ANGIOGRAPHY;  Surgeon: Dixie Dials, MD;  Location: [facility];  Service: Cardiovascular;  Laterality: N/A;   Social History   Occupational History  . 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[employer]  Tobacco Use  . Smoking status: Never Smoker  . Smokeless tobacco: Never Used  Substance and Sexual Activity  . Alcohol use: No    Comment: glass of wine-special occasions  . Drug use: No  . Sexual activity: Never    Birth control/protection: Abstinence

## 2020-05-24 NOTE — Telephone Encounter (Signed)
Patient giving a call back to get lab results, please call patient

## 2020-05-25 ENCOUNTER — Ambulatory Visit
Admission: RE | Admit: 2020-05-25 | Discharge: 2020-05-25 | Disposition: A | Discharge status: Home / Self Care | Payer: Medicare Other | Source: Ambulatory Visit | Attending: Emergency Medicine | Admitting: Emergency Medicine

## 2020-05-25 DIAGNOSIS — R1032 Left lower quadrant pain: Secondary | ICD-10-CM

## 2020-05-30 ENCOUNTER — Ambulatory Visit: Payer: Medicare Other | Admitting: Orthopaedic Surgery

## 2020-06-06 NOTE — Progress Notes (Signed)
Cardiology Office Note:   Date:  06/08/2020   ID:  Embry Manrique, DOB 1945/11/05, MRN [id number]  PCP:  Horald Pollen, MD  Cardiologist:  Skeet Latch, MD  Nephrologist:  Referring MD: Horald Pollen, *   CC: Hypertension, Follow-up  History of Present Illness:    Jo [name] is a 75 y.o. female with a hx of hypertension, hyperlipidemia, and pre-diabetes here for follow-up.  She was initially seen 09/2019 to establish care in the hypertension clinic.  She previously saw Dr. Doylene Canard 09/2017.  At the time she had chest pain.  She had an echo at that time that revealed LVEF 65 to 70% with mild LVH and grade 1 diastolic dysfunction.  Mild calcification was noted of the aortic valve but there was no aortic stenosis.  She also has significant mitral annular calcification.  She had a left heart catheterization that showed no coronary artery disease.    Jo [name] was diagnosed with hypertension about 20 years ago.  She was switched from lisinopril to hydrochlorothiazide.  Since that time her primary care doctor also started her on rosuvastatin.  She had carotid Dopplers 10/2019 that revealed mild ICA stenosis bilaterally.    06/08/2020 Today, she reports her previous constant left-sided chest pain dissipated after about a week, and has not returned since then. She continues to have right knee pain due to arthritis. At home her blood pressure remains stable and well controlled, and she monitors it every day. She notes being stressed lately due to helping with an upcoming wedding. For exercise, she now has a dog that helps her stay active with walking 4-5 times a day, about 15-30 minutes at a time. Aside from the heat, she does well and has no exertional symptoms. She also climbs stairs in her house. She denies any chest pain, shortness of breath, palpitations, or exertional symptoms. No headaches, lightheadedness, or syncope to report. Also has no lower extremity edema,  orthopnea or PND.  Previous antihypertensives: Lisinopril   Past Medical History:  Diagnosis Date  . Anemia   . Carotid bruit 10/11/2019  . Carotid stenosis 10/11/2019  . Goiter   . Hyperlipidemia   . Hypertension   . Lower extremity edema 10/11/2019  . Menopause age 32  . Pure hypercholesterolemia 10/11/2019  . Reflux   . Spinal stenosis     Past Surgical History:  Procedure Laterality Date  . CERVICAL SPINE SURGERY  06/2010  . LEFT HEART CATH AND CORONARY ANGIOGRAPHY N/A 09/16/2017   Procedure: LEFT HEART CATH AND CORONARY ANGIOGRAPHY;  Surgeon: Dixie Dials, MD;  Location: [facility];  Service: Cardiovascular;  Laterality: N/A;    Current Medications: Current Meds  Medication Sig  . aspirin EC 81 MG tablet Take 81 mg by mouth at bedtime.  . cholecalciferol (VITAMIN D) 1000 units tablet Take 5,000 Units by mouth at bedtime.   . Ferrous Sulfate (IRON SLOW RELEASE PO) Take 1 tablet by mouth daily.  Marland Kitchen HYDROCORTISONE EX Apply 1 application topically daily as needed (itching/bug bites).  Marland Kitchen lisinopril-hydrochlorothiazide (ZESTORETIC) 10-12.5 MG tablet Take 1 tablet by mouth daily.  Marland Kitchen OVER THE COUNTER MEDICATION Take 1 capsule by mouth at bedtime. Over the counter stool softener  . polyethylene glycol (MIRALAX / GLYCOLAX) packet Take 17 g by mouth daily as needed (constipation). Mix in 8 oz liquid and drink  . Probiotic Product (DIGESTIVE ADVANTAGE) CAPS Take 1 capsule by mouth daily.  . rosuvastatin (CRESTOR) 20 MG  tablet Take 1 tablet (20 mg total) by mouth daily.  . vitamin B-12 (CYANOCOBALAMIN) 1000 MCG tablet Take 1,000 mcg by mouth at bedtime.      Allergies:   Demerol   Social History   Socioeconomic History  . Marital status: Widowed    Spouse name: Not on file  . Number of children: 3  . Years of education: college  . Highest education level: Not on file  Occupational History  . 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[employer]  Tobacco Use  . Smoking  status: Never Smoker  . Smokeless tobacco: Never Used  Substance and Sexual Activity  . Alcohol use: No    Comment: glass of wine-special occasions  . Drug use: No  . Sexual activity: Never    Birth control/protection: Abstinence  Other Topics Concern  . Not on file  Social History Narrative  . Not on file   Social Determinants of Health   Financial Resource Strain: Not on file  Food Insecurity: Not on file  Transportation Needs: Not on file  Physical Activity: Not on file  Stress: Not on file  Social Connections: Not on file     Family History: The patient's family history includes Breast cancer in her sister; CAD in her sister and sister; Cancer in her brother, sister, sister, and another family member; Heart attack in her father; Heart disease in her brother, father, and mother; Hypertension in her daughter, mother, and another family member; Stroke in her mother.  ROS:   Please see the history of present illness.    (+) Right knee pain (+) Stress All other systems reviewed and are negative.  EKGs/Labs/Other Studies Reviewed:    EKG:   06/08/2020: SR, rate 67 bpm 12/15/2019: EKG is not ordered today.   Recent Labs: 10/18/2019: BNP 79.8; TSH 2.210 05/22/2020: ALT 13; BUN 36; Creatinine, Ser 1.51; Hemoglobin 10.5; Platelets 200.0; Potassium 5.4 No hemolysis seen; Sodium 140   Recent Lipid Panel    Component Value Date/Time   CHOL 337 (H) 10/18/2019 1036   TRIG 123 10/18/2019 1036   HDL 54 10/18/2019 1036   CHOLHDL 6.2 (H) 10/18/2019 1036   CHOLHDL 2.5 12/13/2015 1102   VLDL 14 12/13/2015 1102   LDLCALC 261 (H) 10/18/2019 1036   Echo 09/2017: Study Conclusions   - Left ventricle: The cavity size was normal. There was mild  concentric hypertrophy. Systolic function was vigorous. The  estimated ejection fraction was in the range of 65% to 70%. Wall  motion was normal; there were no regional wall motion  abnormalities. Doppler parameters are consistent with  abnormal  left ventricular relaxation (grade 1 diastolic dysfunction).  - Aortic valve: Mild focal calcification involving the right  coronary and noncoronary cusp.  - Mitral valve: Moderate focal calcification of the posterior  leaflet. Minimal focal calcification of the anterior leaflet.  Mobility of the posterior leaflet was mildly restricted. The  findings are consistent with trivial stenosis. There was mild  regurgitation.  - Left atrium: The atrium was mildly dilated.   LHC 09/2017:  No CAD  09/01/2019: Sodium 140, potassium 4.7, BUN 24, creatinine 1.2 AST 20, ALT 13 WBC 8.3, hemoglobin 10.8, hematocrit 35.2, platelets 239  Carotid Doppler 10/22/19: 1-39% ICA stenosis bilaterally.  Physical Exam:    VS:  BP 132/64 (BP Location: Right Arm, Patient Position: Sitting)   Pulse 67   Ht 5\' 6"  (1.676 m)   Wt 172 lb (78 kg)   BMI 27.76 kg/m  ,  BMI Body mass index is 27.76 kg/m. GENERAL:  Well appearing HEENT: Pupils equal round and reactive, fundi not visualized, oral mucosa unremarkable NECK:  No jugular venous distention, waveform within normal limits, carotid upstroke brisk and symmetric, +R mild carotid bruits LUNGS:  Clear to auscultation bilaterally HEART:  RRR.  PMI not displaced or sustained,S1 and S2 within normal limits, no S3, no S4, no clicks, no rubs, no murmurs ABD:  Flat, positive bowel sounds normal in frequency in pitch, no bruits, no rebound, no guarding, no midline pulsatile mass, no hepatomegaly, no splenomegaly EXT:  2 plus pulses throughout, no edema, no cyanosis no clubbing SKIN:  No rashes no nodules NEURO:  Cranial nerves II through XII grossly intact, motor grossly intact throughout PSYCH:  Cognitively intact, oriented to person place and time   ASSESSMENT:    1. Stenosis of right carotid artery   2. Essential hypertension   3. Pure hypercholesterolemia   4. Stage 3a chronic kidney disease ([place])     PLAN:   Carotid stenosis Mild  10/2019.  Repeat in 1 year.  Continue aspirin and rosuvastatin.  LDL goal is less than 70.  Essential hypertension Blood pressure has been Better controlled.  Continue lisinopril and hydrochlorothiazide.  He is encouraged to keep up with exercise.  Pure hypercholesterolemia Lipids have not been well-controlled.  She was started on rosuvastatin after her most recent levels were checked.  She will come back for fasting lipids and a CMP.  Stage 3a chronic kidney disease ([place]) She follows with nephrology.  We discussed the importance of blood pressure control.  Continue lisinopril and hydrochlorothiazide for now.  She just had labs drawn that demonstrated.  Disposition:    FU with Urvi Imes C. Oval Linsey, MD, [facility] in 6 months.   Medication Adjustments/Labs and Tests Ordered: Current medicines are reviewed at length with the patient today.  Concerns regarding medicines are outlined above.  Orders Placed This Encounter  Procedures  . Lipid panel  . Comprehensive metabolic panel  . EKG 12-Lead   No orders of the defined types were placed in this encounter.  I,Mathew Stumpf,acting as a Education administrator for Skeet Latch, MD.,have documented all relevant documentation on the behalf of Skeet Latch, MD,as directed by  Skeet Latch, MD while in the presence of Skeet Latch, MD.  I, Reydon Oval Linsey, MD have reviewed all documentation for this visit.  The documentation of the exam, diagnosis, procedures, and orders on 06/08/2020 are all accurate and complete.   Signed, Skeet Latch, MD  06/08/2020 6:24 PM    [facility]

## 2020-06-08 ENCOUNTER — Other Ambulatory Visit: Payer: Self-pay

## 2020-06-08 ENCOUNTER — Ambulatory Visit (INDEPENDENT_AMBULATORY_CARE_PROVIDER_SITE_OTHER): Payer: Medicare Other | Admitting: Cardiovascular Disease

## 2020-06-08 ENCOUNTER — Encounter: Payer: Self-pay | Admitting: Cardiovascular Disease

## 2020-06-08 DIAGNOSIS — I10 Essential (primary) hypertension: Secondary | ICD-10-CM | POA: Diagnosis not present

## 2020-06-08 DIAGNOSIS — I6521 Occlusion and stenosis of right carotid artery: Secondary | ICD-10-CM | POA: Diagnosis not present

## 2020-06-08 DIAGNOSIS — N1831 Chronic kidney disease, stage 3a: Secondary | ICD-10-CM

## 2020-06-08 DIAGNOSIS — E7800 Pure hypercholesterolemia, unspecified: Secondary | ICD-10-CM

## 2020-06-08 NOTE — Assessment & Plan Note (Signed)
Mild 10/2019.  Repeat in 1 year.  Continue aspirin and rosuvastatin.  LDL goal is less than 70.

## 2020-06-08 NOTE — Patient Instructions (Signed)
Medication Instructions:  Your physician recommends that you continue on your current medications as directed. Please refer to the Current Medication list given to you today.   *If you need a refill on your cardiac medications before your next appointment, please call your pharmacy*  Lab Work: FASTING LIPID/CMET SOON   If you have labs (blood work) drawn today and your tests are completely normal, you will receive your results only by: Marland Kitchen MyChart Message (if you have MyChart) OR . A paper copy in the mail If you have any lab test that is abnormal or we need to change your treatment, we will call you to review the results.  Testing/Procedures: NONE   Follow-Up: At [facility], you and your health needs are our priority.  As part of our continuing mission to provide you with exceptional heart care, we have created designated Provider Care Teams.  These Care Teams include your primary Cardiologist (physician) and Advanced Practice Providers (APPs -  Physician Assistants and Nurse Practitioners) who all work together to provide you with the care you need, when you need it.  We recommend signing up for the patient portal called "MyChart".  Sign up information is provided on this After Visit Summary.  MyChart is used to connect with patients for Virtual Visits (Telemedicine).  Patients are able to view lab/test results, encounter notes, upcoming appointments, etc.  Non-urgent messages can be sent to your provider as well.   To learn more about what you can do with MyChart, go to [email or web address].    Your next appointment:   6 month(s)  The format for your next appointment:   In Person  Provider:   DR Hermleigh

## 2020-06-08 NOTE — Assessment & Plan Note (Signed)
Lipids have not been well-controlled.  She was started on rosuvastatin after her most recent levels were checked.  She will come back for fasting lipids and a CMP.

## 2020-06-08 NOTE — Assessment & Plan Note (Signed)
Blood pressure has been Better controlled.  Continue lisinopril and hydrochlorothiazide.  He is encouraged to keep up with exercise.

## 2020-06-08 NOTE — Assessment & Plan Note (Signed)
She follows with nephrology.  We discussed the importance of blood pressure control.  Continue lisinopril and hydrochlorothiazide for now.  She just had labs drawn that demonstrated.

## 2020-06-14 ENCOUNTER — Ambulatory Visit: Payer: Medicare Other | Admitting: Cardiovascular Disease

## 2020-06-19 ENCOUNTER — Telehealth: Payer: Self-pay | Admitting: Oncology

## 2020-06-19 NOTE — Telephone Encounter (Signed)
Scheduled per referral. Called and spoke with pt, confirmed 6/14 appt. Pt also made aware to arrive 15-30 mins before appt time and to bring photo ID and insurance card(s)

## 2020-06-21 LAB — COMPREHENSIVE METABOLIC PANEL
ALT: 13 IU/L (ref 0–32)
AST: 22 IU/L (ref 0–40)
Albumin/Globulin Ratio: 1.5 (ref 1.2–2.2)
Albumin: 3.8 g/dL (ref 3.7–4.7)
Alkaline Phosphatase: 54 IU/L (ref 44–121)
BUN/Creatinine Ratio: 28 (ref 12–28)
BUN: 40 mg/dL — ABNORMAL HIGH (ref 8–27)
Bilirubin Total: 0.2 mg/dL (ref 0.0–1.2)
CO2: 24 mmol/L (ref 20–29)
Calcium: 9.8 mg/dL (ref 8.7–10.3)
Chloride: 103 mmol/L (ref 96–106)
Creatinine, Ser: 1.43 mg/dL — ABNORMAL HIGH (ref 0.57–1.00)
Globulin, Total: 2.6 g/dL (ref 1.5–4.5)
Glucose: 86 mg/dL (ref 65–99)
Potassium: 4.9 mmol/L (ref 3.5–5.2)
Sodium: 141 mmol/L (ref 134–144)
Total Protein: 6.4 g/dL (ref 6.0–8.5)
eGFR: 38 mL/min/{1.73_m2} — ABNORMAL LOW (ref >59)

## 2020-06-21 LAB — LIPID PANEL
Chol/HDL Ratio: 3 ratio (ref 0.0–4.4)
Cholesterol, Total: 182 mg/dL (ref 100–199)
HDL: 60 mg/dL (ref >39)
LDL Chol Calc (NIH): 109 mg/dL — ABNORMAL HIGH (ref 0–99)
Triglycerides: 70 mg/dL (ref 0–149)
VLDL Cholesterol Cal: 13 mg/dL (ref 5–40)

## 2020-06-27 ENCOUNTER — Other Ambulatory Visit: Payer: Self-pay

## 2020-06-27 ENCOUNTER — Inpatient Hospital Stay: Payer: Medicare Other | Attending: Oncology | Admitting: Oncology

## 2020-06-27 VITALS — BP 148/57 | HR 72 | Temp 98.6°F | Resp 17 | Wt 174.6 lb

## 2020-06-27 DIAGNOSIS — Z79899 Other long term (current) drug therapy: Secondary | ICD-10-CM | POA: Diagnosis not present

## 2020-06-27 DIAGNOSIS — D6489 Other specified anemias: Secondary | ICD-10-CM

## 2020-06-27 DIAGNOSIS — D649 Anemia, unspecified: Secondary | ICD-10-CM | POA: Diagnosis not present

## 2020-06-27 DIAGNOSIS — D472 Monoclonal gammopathy: Secondary | ICD-10-CM | POA: Diagnosis present

## 2020-06-27 NOTE — Progress Notes (Signed)
Reason for the request:    Monoclonal gammopathy  HPI: I was asked by Dr. Joelyn Oms to evaluate Jo [name] for the evaluation of abnormal serum protein electrophoresis.  Is a 75 year old woman with history of hyperlipidemia, hypertension but otherwise no significant comorbid conditions.  She was noted to have increased creatinine on laboratory testing May 22, 2020.  At that time her creatinine was 1.5 with normal calcium and GFR estimated at 33.45 cc/min.  Repeat testing on June 21, 2020 showed creatinine 1.43 with a creatinine clearance around 38 cc/min.  She was evaluated by Dr. Joelyn Oms and her work-up including a sample to electrophoresis that showed an M spike of 0.2 g/dL.  Serum light chains were elevated including both free kappa and free lambda chains.  The ratio was 1.58 which is normal.  Quantitative immunoglobulins were normal including IgG, IgA and IgM.  Immunofixation showed a monoclonal kappa light chain subtype.  Her GFR at that time was 46 with a creatinine of 1.22.  She had normal electrolytes, calcium and albumin.  Her hemoglobin was 9.7 with a white cell count of 7.9 and a platelet count of 222.  Clinically, she is asymptomatic from these findings.  She denies excessive fatigue, tiredness or weakness.  She denies any bone pain or pathological fractures.  She denies any hematochezia or melena.   She does not report any headaches, blurry vision, syncope or seizures. Does not report any fevers, chills or sweats.  Does not report any cough, wheezing or hemoptysis.  Does not report any chest pain, palpitation, orthopnea or leg edema.  Does not report any nausea, vomiting or abdominal pain.  Does not report any constipation or diarrhea.  Does not report any skeletal complaints.    Does not report frequency, urgency or hematuria.  Does not report any skin rashes or lesions. Does not report any heat or cold intolerance.  Does not report any lymphadenopathy or petechiae.  Does not report any anxiety or  depression.  Remaining review of systems is negative.     Past Medical History:  Diagnosis Date   Anemia    Carotid bruit 10/11/2019   Carotid stenosis 10/11/2019   Goiter    Hyperlipidemia    Hypertension    Lower extremity edema 10/11/2019   Menopause age 46   Pure hypercholesterolemia 10/11/2019   Reflux    Spinal stenosis   :   Past Surgical History:  Procedure Laterality Date   CERVICAL SPINE SURGERY  06/2010   LEFT HEART CATH AND CORONARY ANGIOGRAPHY N/A 09/16/2017   Procedure: LEFT HEART CATH AND CORONARY ANGIOGRAPHY;  Surgeon: Dixie Dials, MD;  Location: [facility];  Service: Cardiovascular;  Laterality: N/A;  :   Current Outpatient Medications:    aspirin EC 81 MG tablet, Take 81 mg by mouth at bedtime., Disp: , Rfl:    cholecalciferol (VITAMIN D) 1000 units tablet, Take 5,000 Units by mouth at bedtime. , Disp: , Rfl:    Ferrous Sulfate (IRON SLOW RELEASE PO), Take 1 tablet by mouth daily., Disp: , Rfl:    HYDROCORTISONE EX, Apply 1 application topically daily as needed (itching/bug bites)., Disp: , Rfl:    lisinopril-hydrochlorothiazide (ZESTORETIC) 10-12.5 MG tablet, Take 1 tablet by mouth daily., Disp: 90 tablet, Rfl: 3   OVER THE COUNTER MEDICATION, Take 1 capsule by mouth at bedtime. Over the counter stool softener, Disp: , Rfl:    polyethylene glycol (MIRALAX / GLYCOLAX) packet, Take 17 g by mouth daily as needed (constipation). Mix  in 8 oz liquid and drink, Disp: , Rfl:    Probiotic Product (DIGESTIVE ADVANTAGE) CAPS, Take 1 capsule by mouth daily., Disp: , Rfl:    rosuvastatin (CRESTOR) 20 MG tablet, Take 1 tablet (20 mg total) by mouth daily., Disp: 90 tablet, Rfl: 3   vitamin B-12 (CYANOCOBALAMIN) 1000 MCG tablet, Take 1,000 mcg by mouth at bedtime. , Disp: , Rfl: :   Allergies  Allergen Reactions   Demerol Nausea And Vomiting    Severe nausea and vomiting  :   Family History  Problem Relation Age of Onset   Stroke Mother    Hypertension  Mother    Heart disease Mother    Heart disease Father    Heart attack Father    Hypertension Daughter    Cancer Sister    Breast cancer Sister    CAD Sister    Cancer Brother    Heart disease Brother    Cancer Sister    CAD Sister    Hypertension Other    Cancer Other        lung x 2  :   Social History   Socioeconomic History   Marital status: Widowed    Spouse name: Not on file   Number of children: 3   Years of education: college   Highest education level: Not on file  Occupational History   Occupation: CSST    Employer: [employer]  Tobacco Use   Smoking status: Never   Smokeless tobacco: Never  Substance and Sexual Activity   Alcohol use: No    Comment: glass of wine-special occasions   Drug use: No   Sexual activity: Never    Birth control/protection: Abstinence  Other Topics Concern   Not on file  Social History Narrative   Not on file   Social Determinants of Health   Financial Resource Strain: Not on file  Food Insecurity: Not on file  Transportation Needs: Not on file  Physical Activity: Not on file  Stress: Not on file  Social Connections: Not on file  Intimate Partner Violence: Not on file  :  Pertinent items are noted in HPI.  Exam: Blood pressure (!) 148/57, pulse 72, temperature 98.6 F (37 C), temperature source Oral, resp. rate 17, weight 174 lb 9.6 oz (79.2 kg), SpO2 100 %.  ECOG 1   General appearance: alert and cooperative appeared without distress. Head: atraumatic without any abnormalities. Eyes: conjunctivae/corneas clear. PERRL.  Sclera anicteric. Throat: lips, mucosa, and tongue normal; without oral thrush or ulcers. Resp: clear to auscultation bilaterally without rhonchi, wheezes or dullness to percussion. Cardio: regular rate and rhythm, S1, S2 normal, no murmur, click, rub or gallop GI: soft, non-tender; bowel sounds normal; no masses,  no organomegaly Skin: Skin color, texture, turgor normal. No rashes or  lesions Lymph nodes: Cervical, supraclavicular, and axillary nodes normal. Neurologic: Grossly normal without any motor, sensory or deep tendon reflexes. Musculoskeletal: No joint deformity or effusion.    Assessment and Plan:   75 year old woman with:  1.  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  M spike is 0.2 g/dL with normal quantitative immunoglobulins.   The differential diagnosis of these findings were discussed at this time.  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  Other considerations including reactive findings are also a possibility.  Amyloidosis or other plasma cell disorder is considered less likely.  I do  not think her renal insufficiency as well as anemia is related to plasma cell disorder at this time.  I see no evidence of endorgan damage to suggest a symptomatic multiple myeloma.  Given the minuscule M spike, normal quantitative immunoglobulins as well as a normal serum free light chains this less likely represents a symptomatic plasma cell disorder.   From a management standpoint, recommended active surveillance repeat laboratory testing in 6 months to follow her progress accordingly.   2.  Anemia: Appears to be multifactorial in nature.  Anemia of renal insufficiency versus chronic disease.  Iron level were 58 with saturation of 25% and low iron binding capacity which could indicate anemia of chronic disease.  We will continue to monitor for the time being consider growth factor support if she develops worsening anemia.   3.  Follow-up: We will be in 6 months for repeat follow-up.  45  minutes were dedicated to this visit. The time was spent on reviewing laboratory data, discussing treatment options, discussing differential diagnosis and answering questions regarding future plan.     A copy of this consult has been forwarded to the  requesting physician.

## 2020-07-05 ENCOUNTER — Telehealth: Payer: Self-pay | Admitting: *Deleted

## 2020-07-05 DIAGNOSIS — Z5181 Encounter for therapeutic drug level monitoring: Secondary | ICD-10-CM

## 2020-07-05 DIAGNOSIS — E785 Hyperlipidemia, unspecified: Secondary | ICD-10-CM

## 2020-07-05 MED ORDER — ROSUVASTATIN CALCIUM 40 MG PO TABS: 40.0000 mg | ORAL_TABLET | Freq: Every day | ORAL | 3 refills | Status: DC

## 2020-07-05 NOTE — Telephone Encounter (Signed)
-----   Message from Skeet Latch, MD sent at 07/04/2020 11:27 AM EDT ----- Cholesterol levels are looking much better, but her LDL cholesterol should be less than 70.  Increase rosuvastatin to 40 mg and repeat lipids and a CMP in 3 months.  Kidney function is stable.

## 2020-07-05 NOTE — Telephone Encounter (Signed)
Advised patient of lab results and ordered labs. Rx for Crestor 40 mg sent to [facility]

## 2020-10-09 ENCOUNTER — Encounter: Payer: Self-pay | Admitting: Gastroenterology

## 2020-10-18 ENCOUNTER — Other Ambulatory Visit: Payer: Self-pay

## 2020-10-18 ENCOUNTER — Encounter: Payer: Self-pay | Admitting: Emergency Medicine

## 2020-10-18 ENCOUNTER — Ambulatory Visit (INDEPENDENT_AMBULATORY_CARE_PROVIDER_SITE_OTHER): Payer: Medicare Other | Admitting: Emergency Medicine

## 2020-10-18 VITALS — BP 132/78 | HR 62 | Temp 98.4°F | Ht 66.0 in | Wt 175.0 lb

## 2020-10-18 DIAGNOSIS — Z23 Encounter for immunization: Secondary | ICD-10-CM | POA: Diagnosis not present

## 2020-10-18 DIAGNOSIS — M1711 Unilateral primary osteoarthritis, right knee: Secondary | ICD-10-CM

## 2020-10-18 DIAGNOSIS — E785 Hyperlipidemia, unspecified: Secondary | ICD-10-CM

## 2020-10-18 DIAGNOSIS — N1832 Chronic kidney disease, stage 3b: Secondary | ICD-10-CM | POA: Diagnosis not present

## 2020-10-18 DIAGNOSIS — I10 Essential (primary) hypertension: Secondary | ICD-10-CM

## 2020-10-18 DIAGNOSIS — D472 Monoclonal gammopathy: Secondary | ICD-10-CM

## 2020-10-18 DIAGNOSIS — R7303 Prediabetes: Secondary | ICD-10-CM | POA: Diagnosis not present

## 2020-10-18 DIAGNOSIS — Z0000 Encounter for general adult medical examination without abnormal findings: Secondary | ICD-10-CM | POA: Diagnosis not present

## 2020-10-18 DIAGNOSIS — R195 Other fecal abnormalities: Secondary | ICD-10-CM

## 2020-10-18 DIAGNOSIS — D638 Anemia in other chronic diseases classified elsewhere: Secondary | ICD-10-CM | POA: Diagnosis not present

## 2020-10-18 LAB — CBC WITH DIFFERENTIAL/PLATELET
Basophils Absolute: 0 10*3/uL (ref 0.0–0.1)
Basophils Relative: 0.2 % (ref 0.0–3.0)
Eosinophils Absolute: 0.2 10*3/uL (ref 0.0–0.7)
Eosinophils Relative: 2.9 % (ref 0.0–5.0)
HCT: 27.3 % — ABNORMAL LOW (ref 36.0–46.0)
Hemoglobin: 9.1 g/dL — ABNORMAL LOW (ref 12.0–15.0)
Lymphocytes Relative: 35 % (ref 12.0–46.0)
Lymphs Abs: 2.8 10*3/uL (ref 0.7–4.0)
MCHC: 33.5 g/dL (ref 30.0–36.0)
MCV: 86.9 fl (ref 78.0–100.0)
Monocytes Absolute: 1 10*3/uL (ref 0.1–1.0)
Monocytes Relative: 12 % (ref 3.0–12.0)
Neutro Abs: 4 10*3/uL (ref 1.4–7.7)
Neutrophils Relative %: 49.9 % (ref 43.0–77.0)
Platelets: 198 10*3/uL (ref 150.0–400.0)
RBC: 3.14 Mil/uL — ABNORMAL LOW (ref 3.87–5.11)
RDW: 14.5 % (ref 11.5–15.5)
WBC: 8 10*3/uL (ref 4.0–10.5)

## 2020-10-18 LAB — LIPID PANEL
Cholesterol: 161 mg/dL (ref 0–200)
HDL: 55.3 mg/dL (ref >39.00)
LDL Cholesterol: 90 mg/dL (ref 0–99)
NonHDL: 105.23
Total CHOL/HDL Ratio: 3
Triglycerides: 75 mg/dL (ref 0.0–149.0)
VLDL: 15 mg/dL (ref 0.0–40.0)

## 2020-10-18 LAB — COMPREHENSIVE METABOLIC PANEL
ALT: 12 U/L (ref 0–35)
AST: 18 U/L (ref 0–37)
Albumin: 3.9 g/dL (ref 3.5–5.2)
Alkaline Phosphatase: 55 U/L (ref 39–117)
BUN: 34 mg/dL — ABNORMAL HIGH (ref 6–23)
CO2: 30 mEq/L (ref 19–32)
Calcium: 10.1 mg/dL (ref 8.4–10.5)
Chloride: 101 mEq/L (ref 96–112)
Creatinine, Ser: 1.9 mg/dL — ABNORMAL HIGH (ref 0.40–1.20)
GFR: 25.32 mL/min — ABNORMAL LOW (ref >60.00)
Glucose, Bld: 82 mg/dL (ref 70–99)
Potassium: 4.6 mEq/L (ref 3.5–5.1)
Sodium: 137 mEq/L (ref 135–145)
Total Bilirubin: 0.3 mg/dL (ref 0.2–1.2)
Total Protein: 7.3 g/dL (ref 6.0–8.3)

## 2020-10-18 LAB — HEMOGLOBIN A1C: Hgb A1c MFr Bld: 6.5 % (ref 4.6–6.5)

## 2020-10-18 NOTE — Progress Notes (Signed)
Jo [name] 75 y.o.   Chief Complaint  Patient presents with   Annual Exam    HISTORY OF PRESENT ILLNESS: This is a 75 y.o. female here for her annual exam. Doing well.  Has no complaints or medical concerns today.  Has the following chronic medical problems: 1.  Hypertension.  On Zestoretic 10-12.5 mg daily. 2.  Right carotid artery stenosis.  Takes baby aspirin a day 3.  Dyslipidemia: On rosuvastatin 40 mg daily 4.  Osteoarthritis, mostly right knee. 5.  Chronic kidney disease stage IIIb.  Recently saw nephrologist 6.  Anemia of chronic disease.  Work-up for multiple myeloma amyloidosis was negative was found to have MGUS.  HPI   Prior to Admission medications   Medication Sig Start Date End Date Taking? Authorizing Provider  aspirin EC 81 MG tablet Take 81 mg by mouth at bedtime.   Yes [provider]  cholecalciferol (VITAMIN D) 1000 units tablet Take 5,000 Units by mouth at bedtime.    Yes [provider]  Ferrous Sulfate (IRON SLOW RELEASE PO) Take 1 tablet by mouth daily.   Yes [provider]  HYDROCORTISONE EX Apply 1 application topically daily as needed (itching/bug bites).   Yes [provider]  lisinopril-hydrochlorothiazide (ZESTORETIC) 10-12.5 MG tablet Take 1 tablet by mouth daily. 10/26/19  Yes Sagardia, Ines Bloomer, MD  OVER THE COUNTER MEDICATION Take 1 capsule by mouth at bedtime. Over the counter stool softener   Yes [provider]  polyethylene glycol (MIRALAX / GLYCOLAX) packet Take 17 g by mouth daily as needed (constipation). Mix in 8 oz liquid and drink   Yes [provider]  Probiotic Product (DIGESTIVE ADVANTAGE) CAPS Take 1 capsule by mouth daily.   Yes [provider]  rosuvastatin (CRESTOR) 40 MG tablet Take 1 tablet (40 mg total) by mouth daily. 07/05/20  Yes Skeet Latch, MD  vitamin B-12 (CYANOCOBALAMIN) 1000 MCG tablet Take 1,000 mcg by mouth at bedtime.    Yes [provider]    Allergies  Allergen Reactions   Demerol Nausea And Vomiting    Severe nausea and vomiting    Patient Active Problem List   Diagnosis Date Noted   Unilateral primary osteoarthritis, right knee 05/24/2020   Flank pain 05/22/2020   Colon cancer screening 05/22/2020   Stage 3a chronic kidney disease ([place]) 10/26/2019   Pure hypercholesterolemia 10/11/2019   Carotid stenosis 10/11/2019   Impaired fasting glucose 12/18/2016   Pre-diabetes 10/25/2014   Essential hypertension    Dyslipidemia    Reflux    Mild anemia     Past Medical History:  Diagnosis Date   Anemia    Carotid bruit 10/11/2019   Carotid stenosis 10/11/2019   Goiter    Hyperlipidemia    Hypertension    Lower extremity edema 10/11/2019   Menopause age 87   Pure hypercholesterolemia 10/11/2019   Reflux    Spinal stenosis     Past Surgical History:  Procedure Laterality Date   CERVICAL SPINE SURGERY  06/2010   LEFT HEART CATH AND CORONARY ANGIOGRAPHY N/A 09/16/2017   Procedure: LEFT HEART CATH AND CORONARY ANGIOGRAPHY;  Surgeon: Dixie Dials, MD;  Location: [facility];  Service: Cardiovascular;  Laterality: N/A;    Social History   Socioeconomic History   Marital status: Widowed    Spouse name: Not on file   Number of children: 3   Years of education: college   Highest education level: Not on file  Occupational History   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[employer], sport and exercise  Tobacco Use   Smoking status: Never   Smokeless tobacco: Never  Substance and Sexual Activity   Alcohol use: No    Comment: glass of wine-special occasions   Drug use: No   Sexual activity: Never    Birth control/protection: Abstinence  Other Topics Concern   Not on file  Social History Narrative   Not on file   Social Determinants of Health   Financial Resource Strain: Not on file  Food Insecurity: Not on file  Transportation Needs: Not on file  Physical Activity: Not on file  Stress: Not on file   Social Connections: Not on file  Intimate Partner Violence: Not on file    Family History  Problem Relation Age of Onset   Stroke Mother    Hypertension Mother    Heart disease Mother    Heart disease Father    Heart attack Father    Hypertension Daughter    Cancer Sister    Breast cancer Sister    CAD Sister    Cancer Brother    Heart disease Brother    Cancer Sister    CAD Sister    Hypertension Other    Cancer Other        lung x 2     Review of Systems  Constitutional: Negative.  Negative for chills and fever.  HENT: Negative.  Negative for congestion and sore throat.   Respiratory: Negative.  Negative for cough and shortness of breath.   Cardiovascular: Negative.  Negative for chest pain and palpitations.  Gastrointestinal:  Negative for abdominal pain, diarrhea, nausea and vomiting.  Genitourinary: Negative.  Negative for dysuria and hematuria.  Musculoskeletal:  Positive for joint pain.  Skin: Negative.  Negative for rash.  Neurological:  Negative for dizziness and headaches.  All other systems reviewed and are negative. Today's Vitals   10/18/20 1448  BP: 132/78  Pulse: 62  Temp: 98.4 F (36.9 C)  TempSrc: Oral  SpO2: 98%  Weight: 175 lb (79.4 kg)  Height: 5' 6" (1.676 m)   Body mass index is 28.25 kg/m. Wt Readings from Last 3 Encounters:  10/18/20 175 lb (79.4 kg)  06/27/20 174 lb 9.6 oz (79.2 kg)  06/08/20 172 lb (78 kg)     Physical Exam Vitals reviewed.  Constitutional:      Appearance: Normal appearance.  HENT:     Head: Normocephalic.  Eyes:     Extraocular Movements: Extraocular movements intact.     Pupils: Pupils are equal, round, and reactive to light.  Cardiovascular:     Rate and Rhythm: Normal rate and regular rhythm.     Pulses: Normal pulses.     Heart sounds: Normal heart sounds.  Pulmonary:     Effort: Pulmonary effort is normal.     Breath sounds: Normal breath sounds.  Abdominal:     Palpations: Abdomen is soft.      Tenderness: There is no abdominal tenderness.  Musculoskeletal:     Cervical back: Normal range of motion and neck supple. No tenderness.     Right lower leg: No edema.     Left lower leg: No edema.  Lymphadenopathy:     Cervical: No cervical adenopathy.  Skin:    General: Skin is warm and dry.     Capillary Refill: Capillary refill takes less than 2 seconds.  Neurological:     General: No focal deficit present.  Mental Status: She is alert and oriented to person, place, and time.  Psychiatric:        Mood and Affect: Mood normal.        Behavior: Behavior normal.     ASSESSMENT & PLAN: Teyah was seen today for annual exam.  Diagnoses and all orders for this visit:  Routine general medical examination at a health care facility  Stage 3b chronic kidney disease ([place]) -     Comprehensive metabolic panel  Essential hypertension -     Comprehensive metabolic panel  Dyslipidemia -     Lipid panel  Pre-diabetes -     Hemoglobin A1c  MGUS (monoclonal gammopathy of unknown significance)  Need for influenza vaccination -     Flu Vaccine QUAD High Dose(Fluad)  Anemia of chronic disease -     CBC with Differential  Positive colorectal cancer screening using Cologuard test -     Ambulatory referral to Gastroenterology  Primary osteoarthritis of right knee  Other orders -     Cancel: Flu Vaccine QUAD 3moIM (Fluarix, Fluzone & Alfiuria Quad PF)   Modifiable risk factors discussed with patient. Anticipatory guidance according to age provided. The following topics were also discussed: Social Determinants of Health Smoking.  Non-smoker Diet and nutrition Benefits of exercise Cancer screening and need for colonoscopy given positive Cologuard test in 2020 Vaccinations recommendations Cardiovascular risk assessment Hypertension and dyslipidemia assessment and medication review Review of most recent office visits with cardiologist, orthopedist, nephrologist, and  hematologist in the past 5 months Mental health including depression and anxiety Fall and accident prevention   Patient Instructions  Health Maintenance, Female Adopting a healthy lifestyle and getting preventive care are important in promoting health and wellness. Ask your health care provider about: The right schedule for you to have regular tests and exams. Things you can do on your own to prevent diseases and keep yourself healthy. What should I know about diet, weight, and exercise? Eat a healthy diet  Eat a diet that includes plenty of vegetables, fruits, low-fat dairy products, and lean protein. Do not eat a lot of foods that are high in solid fats, added sugars, or sodium. Maintain a healthy weight Body mass index (BMI) is used to identify weight problems. It estimates body fat based on height and weight. Your health care provider can help determine your BMI and help you achieve or maintain a healthy weight. Get regular exercise Get regular exercise. This is one of the most important things you can do for your health. Most adults should: Exercise for at least 150 minutes each week. The exercise should increase your heart rate and make you sweat (moderate-intensity exercise). Do strengthening exercises at least twice a week. This is in addition to the moderate-intensity exercise. Spend less time sitting. Even light physical activity can be beneficial. Watch cholesterol and blood lipids Have your blood tested for lipids and cholesterol at 76years of age, then have this test every 5 years. Have your cholesterol levels checked more often if: Your lipid or cholesterol levels are high. You are older than 76years of age. You are at high risk for heart disease. What should I know about cancer screening? Depending on your health history and family history, you may need to have cancer screening at various ages. This may include screening for: Breast cancer. Cervical cancer. Colorectal  cancer. Skin cancer. Lung cancer. What should I know about heart disease, diabetes, and high blood pressure? Blood pressure and heart disease  High blood pressure causes heart disease and increases the risk of stroke. This is more likely to develop in people who have high blood pressure readings, are of African descent, or are overweight. Have your blood pressure checked: Every 3-5 years if you are 26-19 years of age. Every year if you are 32 years old or older. Diabetes Have regular diabetes screenings. This checks your fasting blood sugar level. Have the screening done: Once every three years after age 9 if you are at a normal weight and have a low risk for diabetes. More often and at a younger age if you are overweight or have a high risk for diabetes. What should I know about preventing infection? Hepatitis B If you have a higher risk for hepatitis B, you should be screened for this virus. Talk with your health care provider to find out if you are at risk for hepatitis B infection. Hepatitis C Testing is recommended for: Everyone born from [birth year] through 1966. Anyone with known risk factors for hepatitis C. Sexually transmitted infections (STIs) Get screened for STIs, including gonorrhea and chlamydia, if: You are sexually active and are younger than 76 years of age. You are older than 76 years of age and your health care provider tells you that you are at risk for this type of infection. Your sexual activity has changed since you were last screened, and you are at increased risk for chlamydia or gonorrhea. Ask your health care provider if you are at risk. Ask your health care provider about whether you are at high risk for HIV. Your health care provider may recommend a prescription medicine to help prevent HIV infection. If you choose to take medicine to prevent HIV, you should first get tested for HIV. You should then be tested every 3 months for as long as you are taking the  medicine. Pregnancy If you are about to stop having your period (premenopausal) and you may become pregnant, seek counseling before you get pregnant. Take 400 to 800 micrograms (mcg) of folic acid every day if you become pregnant. Ask for birth control (contraception) if you want to prevent pregnancy. Osteoporosis and menopause Osteoporosis is a disease in which the bones lose minerals and strength with aging. This can result in bone fractures. If you are 61 years old or older, or if you are at risk for osteoporosis and fractures, ask your health care provider if you should: Be screened for bone loss. Take a calcium or vitamin D supplement to lower your risk of fractures. Be given hormone replacement therapy (HRT) to treat symptoms of menopause. Follow these instructions at home: Lifestyle Do not use any products that contain nicotine or tobacco, such as cigarettes, e-cigarettes, and chewing tobacco. If you need help quitting, ask your health care provider. Do not use street drugs. Do not share needles. Ask your health care provider for help if you need support or information about quitting drugs. Alcohol use Do not drink alcohol if: Your health care provider tells you not to drink. You are pregnant, may be pregnant, or are planning to become pregnant. If you drink alcohol: Limit how much you use to 0-1 drink a day. Limit intake if you are breastfeeding. Be aware of how much alcohol is in your drink. In the [place] one drink equals one 12 oz bottle of beer (355 mL), one 5 oz glass of wine (148 mL), or one 1 oz glass of hard liquor (44 mL). General instructions Schedule regular health, dental, and eye  exams. Stay current with your vaccines. Tell your health care provider if: You often feel depressed. You have ever been abused or do not feel safe at home. Summary Adopting a healthy lifestyle and getting preventive care are important in promoting health and wellness. Follow your health  care provider's instructions about healthy diet, exercising, and getting tested or screened for diseases. Follow your health care provider's instructions on monitoring your cholesterol and blood pressure. This information is not intended to replace advice given to you by your health care provider. Make sure you discuss any questions you have with your health care provider. Document Revised: 03/10/2020 Document Reviewed: 12/24/2017 Elsevier Patient Education  2022 [place], [place] [facility]

## 2020-10-18 NOTE — Patient Instructions (Signed)
Health Maintenance, Female Adopting a healthy lifestyle and getting preventive care are important in promoting health and wellness. Ask your health care provider about: The right schedule for you to have regular tests and exams. Things you can do on your own to prevent diseases and keep yourself healthy. What should I know about diet, weight, and exercise? Eat a healthy diet  Eat a diet that includes plenty of vegetables, fruits, low-fat dairy products, and lean protein. Do not eat a lot of foods that are high in solid fats, added sugars, or sodium. Maintain a healthy weight Body mass index (BMI) is used to identify weight problems. It estimates body fat based on height and weight. Your health care provider can help determine your BMI and help you achieve or maintain a healthy weight. Get regular exercise Get regular exercise. This is one of the most important things you can do for your health. Most adults should: Exercise for at least 150 minutes each week. The exercise should increase your heart rate and make you sweat (moderate-intensity exercise). Do strengthening exercises at least twice a week. This is in addition to the moderate-intensity exercise. Spend less time sitting. Even light physical activity can be beneficial. Watch cholesterol and blood lipids Have your blood tested for lipids and cholesterol at 76 years of age, then have this test every 5 years. Have your cholesterol levels checked more often if: Your lipid or cholesterol levels are high. You are older than 76 years of age. You are at high risk for heart disease. What should I know about cancer screening? Depending on your health history and family history, you may need to have cancer screening at various ages. This may include screening for: Breast cancer. Cervical cancer. Colorectal cancer. Skin cancer. Lung cancer. What should I know about heart disease, diabetes, and high blood pressure? Blood pressure and heart  disease High blood pressure causes heart disease and increases the risk of stroke. This is more likely to develop in people who have high blood pressure readings, are of African descent, or are overweight. Have your blood pressure checked: Every 3-5 years if you are 18-39 years of age. Every year if you are 40 years old or older. Diabetes Have regular diabetes screenings. This checks your fasting blood sugar level. Have the screening done: Once every three years after age 40 if you are at a normal weight and have a low risk for diabetes. More often and at a younger age if you are overweight or have a high risk for diabetes. What should I know about preventing infection? Hepatitis B If you have a higher risk for hepatitis B, you should be screened for this virus. Talk with your health care provider to find out if you are at risk for hepatitis B infection. Hepatitis C Testing is recommended for: Everyone born from 1945 through 1965. Anyone with known risk factors for hepatitis C. Sexually transmitted infections (STIs) Get screened for STIs, including gonorrhea and chlamydia, if: You are sexually active and are younger than 76 years of age. You are older than 76 years of age and your health care provider tells you that you are at risk for this type of infection. Your sexual activity has changed since you were last screened, and you are at increased risk for chlamydia or gonorrhea. Ask your health care provider if you are at risk. Ask your health care provider about whether you are at high risk for HIV. Your health care provider may recommend a prescription medicine   to help prevent HIV infection. If you choose to take medicine to prevent HIV, you should first get tested for HIV. You should then be tested every 3 months for as long as you are taking the medicine. Pregnancy If you are about to stop having your period (premenopausal) and you may become pregnant, seek counseling before you get  pregnant. Take 400 to 800 micrograms (mcg) of folic acid every day if you become pregnant. Ask for birth control (contraception) if you want to prevent pregnancy. Osteoporosis and menopause Osteoporosis is a disease in which the bones lose minerals and strength with aging. This can result in bone fractures. If you are 65 years old or older, or if you are at risk for osteoporosis and fractures, ask your health care provider if you should: Be screened for bone loss. Take a calcium or vitamin D supplement to lower your risk of fractures. Be given hormone replacement therapy (HRT) to treat symptoms of menopause. Follow these instructions at home: Lifestyle Do not use any products that contain nicotine or tobacco, such as cigarettes, e-cigarettes, and chewing tobacco. If you need help quitting, ask your health care provider. Do not use street drugs. Do not share needles. Ask your health care provider for help if you need support or information about quitting drugs. Alcohol use Do not drink alcohol if: Your health care provider tells you not to drink. You are pregnant, may be pregnant, or are planning to become pregnant. If you drink alcohol: Limit how much you use to 0-1 drink a day. Limit intake if you are breastfeeding. Be aware of how much alcohol is in your drink. In the [place] one drink equals one 12 oz bottle of beer (355 mL), one 5 oz glass of wine (148 mL), or one 1 oz glass of hard liquor (44 mL). General instructions Schedule regular health, dental, and eye exams. Stay current with your vaccines. Tell your health care provider if: You often feel depressed. You have ever been abused or do not feel safe at home. Summary Adopting a healthy lifestyle and getting preventive care are important in promoting health and wellness. Follow your health care provider's instructions about healthy diet, exercising, and getting tested or screened for diseases. Follow your health care provider's  instructions on monitoring your cholesterol and blood pressure. This information is not intended to replace advice given to you by your health care provider. Make sure you discuss any questions you have with your health care provider. Document Revised: 03/10/2020 Document Reviewed: 12/24/2017 Elsevier Patient Education  2022 Elsevier Inc.  

## 2020-10-19 NOTE — Progress Notes (Signed)
Call patient please.  Worsening kidney function.  Needs to follow-up with nephrologist who saw her earlier this year.  Thanks.

## 2020-10-26 ENCOUNTER — Other Ambulatory Visit: Payer: Self-pay | Admitting: Emergency Medicine

## 2020-10-26 DIAGNOSIS — I10 Essential (primary) hypertension: Secondary | ICD-10-CM

## 2020-11-06 ENCOUNTER — Telehealth: Payer: Self-pay | Admitting: Cardiovascular Disease

## 2020-11-06 NOTE — Telephone Encounter (Signed)
Patient is returning call to discuss lab results. 

## 2020-11-06 NOTE — Telephone Encounter (Signed)
Returned call to patient Recent labs were done by PCP No recent testing ordered by our office  Patient states Rip Harbour called her Routed to primary nurse to review

## 2020-11-07 NOTE — Telephone Encounter (Signed)
Left message to call back  

## 2020-11-07 NOTE — Telephone Encounter (Signed)
Earvin Hansen, LPN  [id number]/[id number]  9:31 PM EDT Back to Top    Left message to call back

## 2020-11-07 NOTE — Telephone Encounter (Signed)
-----   Message from Skeet Latch, MD sent at 10/30/2020  5:52 AM EDT ----- Her cholesterol levels are stable.  However given that her carotid Dopplers showed some blockage, her LDL should be less than 70.  She is taking the rosuvastatin every day, then we will need to have her see our pharmacist to discuss escalating therapy and considering a PCSK9 inhibitor.  If she is not taking it every day, then she needs to take it every day and repeat labs in 2 to 3 months

## 2020-11-24 ENCOUNTER — Encounter (HOSPITAL_BASED_OUTPATIENT_CLINIC_OR_DEPARTMENT_OTHER): Payer: Self-pay | Admitting: *Deleted

## 2020-11-27 NOTE — Telephone Encounter (Signed)
Mailed letter to contact office regarding labs

## 2020-11-28 ENCOUNTER — Other Ambulatory Visit: Payer: Self-pay | Admitting: Nephrology

## 2020-11-28 ENCOUNTER — Other Ambulatory Visit (HOSPITAL_COMMUNITY): Payer: Self-pay | Admitting: Nephrology

## 2020-11-28 DIAGNOSIS — N1832 Chronic kidney disease, stage 3b: Secondary | ICD-10-CM

## 2020-11-28 DIAGNOSIS — N200 Calculus of kidney: Secondary | ICD-10-CM

## 2020-11-28 DIAGNOSIS — R809 Proteinuria, unspecified: Secondary | ICD-10-CM

## 2020-11-28 DIAGNOSIS — I129 Hypertensive chronic kidney disease with stage 1 through stage 4 chronic kidney disease, or unspecified chronic kidney disease: Secondary | ICD-10-CM

## 2020-11-28 DIAGNOSIS — D472 Monoclonal gammopathy: Secondary | ICD-10-CM

## 2020-11-30 ENCOUNTER — Other Ambulatory Visit: Payer: Self-pay

## 2020-11-30 ENCOUNTER — Ambulatory Visit (AMBULATORY_SURGERY_CENTER): Payer: Self-pay | Admitting: *Deleted

## 2020-11-30 ENCOUNTER — Telehealth (HOSPITAL_BASED_OUTPATIENT_CLINIC_OR_DEPARTMENT_OTHER): Payer: Self-pay | Admitting: Cardiovascular Disease

## 2020-11-30 ENCOUNTER — Encounter: Payer: Self-pay | Admitting: Gastroenterology

## 2020-11-30 VITALS — Ht 66.0 in | Wt 173.0 lb

## 2020-11-30 DIAGNOSIS — R195 Other fecal abnormalities: Secondary | ICD-10-CM

## 2020-11-30 MED ORDER — NA SULFATE-K SULFATE-MG SULF 17.5-3.13-1.6 GM/177ML PO SOLN: 1.0000 | Freq: Once | ORAL | 0 refills | Status: AC

## 2020-11-30 NOTE — Telephone Encounter (Signed)
Spoke to patient . Information given . Patient states she has been taking 40 mg rosuvastatin daily with missing a dose.  Appointment schedule for dec 14 , 2022 to discuss options with  Lipid clinic Patient verbalized understanding

## 2020-11-30 NOTE — Telephone Encounter (Signed)
Follow Up:    Patient said she received a letter from [facility], calling about that.

## 2020-11-30 NOTE — Telephone Encounter (Signed)
Message from Skeet Latch, MD sent at 10/30/2020  5:52 AM EDT ----- Her cholesterol levels are stable.  However given that her carotid Dopplers showed some blockage, her LDL should be less than 70.  She is taking the rosuvastatin every day, then we will need to have her see our pharmacist to discuss escalating therapy and considering a PCSK9 inhibitor.  If she is not taking it every day, then she needs to take it every day and repeat labs in 2 to 3 months

## 2020-11-30 NOTE — Progress Notes (Signed)
No egg or soy allergy known to patient  No issues known to pt with past sedation with any surgeries or procedures Patient denies ever being told they had issues or difficulty with intubation  No FH of Malignant Hyperthermia Pt is not on diet pills Pt is not on  home 02  Pt is not on blood thinners  Pt denies issues with constipation - uses Miralax- she states never feels like empties bowels- goes 5-6 days at times with no BM's  No A fib or A flutter  Pt has kidney biopsy 1-30- Per Dr Tarri Glenn, wait at least 2 weeks post biopsy for Colon- RS with Pt in [facility]   Pt is fully vaccinated  for Covid   NO PA's for preps discussed with pt In [facility] today  Discussed with pt there will be an out-of-pocket cost for prep and that varies from $0 to 70 +  dollars - pt verbalized understanding   Due to the COVID-19 pandemic we are asking patients to follow certain guidelines in [facility] and the [place]   Pt aware of COVID protocols and [facility] guidelines

## 2020-12-12 ENCOUNTER — Other Ambulatory Visit: Payer: Self-pay | Admitting: Radiology

## 2020-12-12 NOTE — H&P (Signed)
Chief Complaint: Patient was seen in consultation today for non-focal renal biopsy  Referring Physician(s): Sanford,Ryan B  Supervising Physician: Sandi Mariscal  Patient Status: [facility] - Out-pt  History of Present Illness: Jo [name] is a 75 y.o. female with a medical history significant for HTN and progressive CKD with proteinuria and macroalbuminuria. 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 Autoimmune and infectious serologies are pending. Her creatinine levels have been slowly trending upwards.   Interventional Radiology has been asked to evaluate this patient for an image-guided non-focal renal biopsy for further work up.   Past Medical History:  Diagnosis Date   Allergy    Anemia    Arthritis    right knee   Carotid bruit 10/11/2019   Carotid stenosis 10/11/2019   Cataract    forming both   GERD (gastroesophageal reflux disease)    past hx   Goiter    Hyperlipidemia    Hypertension    Lower extremity edema 10/11/2019   Menopause age 30   Pure hypercholesterolemia 10/11/2019   Reflux    Spinal stenosis     Past Surgical History:  Procedure Laterality Date   CERVICAL SPINE SURGERY  06/15/2010   COLONOSCOPY  2005   normal   LEFT HEART CATH AND CORONARY ANGIOGRAPHY N/A 09/16/2017   Procedure: LEFT HEART CATH AND CORONARY ANGIOGRAPHY;  Surgeon: Dixie Dials, MD;  Location: [facility];  Service: Cardiovascular;  Laterality: N/A;    Allergies: Demerol  Medications: Prior to Admission medications   Medication Sig Start Date End Date Taking? Authorizing Provider  acetaminophen (TYLENOL) 325 MG tablet Take 325 mg by mouth every 6 (six) hours as needed for moderate pain.   Yes [provider]  aspirin EC 81 MG tablet Take 81 mg by mouth at bedtime.   Yes [provider]  Cholecalciferol (DIALYVITE VITAMIN D 5000) 125 MCG (5000 UT) capsule Take 5,000 Units by mouth at bedtime.   Yes  [provider]  Ferrous Sulfate (IRON SLOW RELEASE PO) Take 1 tablet by mouth daily.   Yes [provider]  lisinopril-hydrochlorothiazide (ZESTORETIC) 10-12.5 MG tablet Take 1 tablet by mouth once daily 10/26/20  Yes Sagardia, Newton, MD  polyethylene glycol [facility] / GLYCOLAX) packet Take 17 g by mouth daily as needed (constipation). Mix in 8 oz liquid and drink   Yes [provider]  rosuvastatin (CRESTOR) 40 MG tablet Take 1 tablet (40 mg total) by mouth daily. 07/05/20  Yes Skeet Latch, MD  vitamin B-12 (CYANOCOBALAMIN) 1000 MCG tablet Take 1,000 mcg by mouth at bedtime.    Yes [provider]  Probiotic Product (DIGESTIVE ADVANTAGE) CAPS Take 1 capsule by mouth daily.    [provider]     Family History  Problem Relation Age of Onset   Stroke Mother    Hypertension Mother    Heart disease Mother    Heart disease Father    Heart attack Father    Cancer Sister    Breast cancer Sister    CAD Sister    Cancer Sister    CAD Sister    Lung cancer Sister    Cancer Brother    Heart disease Brother    Lung cancer Brother    Hypertension Daughter    Hypertension Other    Cancer Other        lung x 2   Colon cancer Neg Hx  Colon polyps Neg Hx    Esophageal cancer Neg Hx    Rectal cancer Neg Hx    Stomach cancer Neg Hx     Social History   Socioeconomic History   Marital status: Widowed    Spouse name: Not on file   Number of children: 3   Years of education: college   Highest education level: Not on file  Occupational History   Occupation: CSST    Employer: [employer]  Tobacco Use   Smoking status: Never   Smokeless tobacco: Never  Substance and Sexual Activity   Alcohol use: No    Comment: glass of wine-special occasions   Drug use: No   Sexual activity: Never    Birth control/protection: Abstinence  Other Topics Concern   Not on file  Social History Narrative   Not on file   Social Determinants  of Health   Financial Resource Strain: Not on file  Food Insecurity: Not on file  Transportation Needs: Not on file  Physical Activity: Not on file  Stress: Not on file  Social Connections: Not on file    Review of Systems: A 12 point ROS discussed and pertinent positives are indicated in the HPI above.  All other systems are negative.  Review of Systems  Constitutional:  Negative for chills and fatigue.  Respiratory:  Positive for shortness of breath. Negative for cough.        Occasional   Cardiovascular:  Positive for leg swelling. Negative for chest pain.       Occasional   Gastrointestinal:  Negative for abdominal pain, diarrhea, nausea and vomiting.  Neurological:  Positive for numbness. Negative for dizziness and headaches.       Occasional lower extremity numbness and tingling; history of neuropathy    Vital Signs: BP 111/73 (BP Location: Right Arm)   Pulse 78   Temp 98.6 F (37 C) (Oral)   Ht 5\' 7"  (1.702 m)   Wt 172 lb (78 kg)   SpO2 100%   BMI 26.94 kg/m   Physical Exam Constitutional:      General: She is not in acute distress.    Appearance: She is not ill-appearing.  HENT:     Mouth/Throat:     Mouth: Mucous membranes are moist.     Pharynx: Oropharynx is clear.     Comments: Dentures upper/lower Cardiovascular:     Rate and Rhythm: Normal rate and regular rhythm.     Pulses: Normal pulses.     Heart sounds: Normal heart sounds.  Pulmonary:     Effort: Pulmonary effort is normal.     Breath sounds: Normal breath sounds.  Abdominal:     General: Bowel sounds are normal.     Palpations: Abdomen is soft.     Tenderness: There is no abdominal tenderness.  Musculoskeletal:        General: Normal range of motion.     Right lower leg: No edema.     Left lower leg: No edema.  Skin:    General: Skin is warm and dry.  Neurological:     Mental Status: She is alert and oriented to person, place, and time.  Psychiatric:        Mood and Affect: Mood  normal.        Behavior: Behavior normal.        Thought Content: Thought content normal.        Judgment: Judgment normal.    Imaging: No results found.  Labs:  CBC: Recent Labs    05/22/20 1456 10/18/20 1535 12/13/20 0607  WBC 8.7 8.0 7.8  HGB 10.5* 9.1* 8.9*  HCT 31.4* 27.3* 27.3*  PLT 200.0 198.0 199    COAGS: Recent Labs    12/13/20 0607  INR 1.0    BMP: Recent Labs    05/22/20 1456 06/21/20 0809 10/18/20 1535  NA 140 141 137  K 5.4 No hemolysis seen* 4.9 4.6  CL 104 103 101  CO2 30 24 30   GLUCOSE 89 86 82  BUN 36* 40* 34*  CALCIUM 10.5 9.8 10.1  CREATININE 1.51* 1.43* 1.90*    LIVER FUNCTION TESTS: Recent Labs    05/22/20 1456 06/21/20 0809 10/18/20 1535  BILITOT 0.4 0.2 0.3  AST 18 22 18   ALT 13 13 12   ALKPHOS 47 54 55  PROT 7.4 6.4 7.3  ALBUMIN 3.9 3.8 3.9    TUMOR MARKERS: No results for input(s): AFPTM, CEA, CA199, CHROMGRNA in the last 8760 hours.  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 Jo [name], 75 year old female, presents today to the [facility] department for an image-guided non-focal renal biopsy.  Risks and benefits of this procedure were discussed with the patient and/or patient's family including, but not limited to bleeding, infection, damage to adjacent structures or low yield requiring additional tests.  All of the questions were answered and there is agreement to proceed.  Consent signed and in chart. She has been NPO.   Thank you for this interesting consult.  I greatly enjoyed meeting Jo [name] and look forward to participating in their care.  A copy of this report was sent to the requesting provider on this date.  Electronically Signed: Soyla Dryer, AGACNP-BC [phone] 12/13/2020, 7:46 AM   I spent a total of  30 Minutes   in face to face in clinical consultation, greater than 50% of which  was counseling/coordinating care for non-focal renal biopsy

## 2020-12-13 ENCOUNTER — Ambulatory Visit (HOSPITAL_COMMUNITY)
Admission: RE | Admit: 2020-12-13 | Discharge: 2020-12-13 | Disposition: A | Discharge status: Home / Self Care | Payer: Medicare Other | Source: Ambulatory Visit | Attending: Nephrology | Admitting: Nephrology

## 2020-12-13 ENCOUNTER — Other Ambulatory Visit: Payer: Self-pay

## 2020-12-13 DIAGNOSIS — R809 Proteinuria, unspecified: Secondary | ICD-10-CM | POA: Insufficient documentation

## 2020-12-13 DIAGNOSIS — I129 Hypertensive chronic kidney disease with stage 1 through stage 4 chronic kidney disease, or unspecified chronic kidney disease: Secondary | ICD-10-CM | POA: Insufficient documentation

## 2020-12-13 DIAGNOSIS — N1832 Chronic kidney disease, stage 3b: Secondary | ICD-10-CM | POA: Insufficient documentation

## 2020-12-13 DIAGNOSIS — D472 Monoclonal gammopathy: Secondary | ICD-10-CM | POA: Diagnosis not present

## 2020-12-13 DIAGNOSIS — N200 Calculus of kidney: Secondary | ICD-10-CM | POA: Diagnosis not present

## 2020-12-13 LAB — PROTIME-INR
INR: 1 (ref 0.8–1.2)
Prothrombin Time: 13.6 seconds (ref 11.4–15.2)

## 2020-12-13 LAB — CBC
HCT: 27.3 % — ABNORMAL LOW (ref 36.0–46.0)
Hemoglobin: 8.9 g/dL — ABNORMAL LOW (ref 12.0–15.0)
MCH: 29.5 pg (ref 26.0–34.0)
MCHC: 32.6 g/dL (ref 30.0–36.0)
MCV: 90.4 fL (ref 80.0–100.0)
Platelets: 199 10*3/uL (ref 150–400)
RBC: 3.02 MIL/uL — ABNORMAL LOW (ref 3.87–5.11)
RDW: 13.4 % (ref 11.5–15.5)
WBC: 7.8 10*3/uL (ref 4.0–10.5)
nRBC: 0 % (ref 0.0–0.2)

## 2020-12-13 MED ORDER — FENTANYL CITRATE (PF) 100 MCG/2ML IJ SOLN
INTRAMUSCULAR | Status: AC
  Filled 2020-12-13: qty 2

## 2020-12-13 MED ORDER — GELATIN ABSORBABLE 12-7 MM EX MISC
CUTANEOUS | Status: AC
  Filled 2020-12-13: qty 1

## 2020-12-13 MED ORDER — LIDOCAINE-EPINEPHRINE 1 %-1:100000 IJ SOLN
INTRAMUSCULAR | Status: AC
  Filled 2020-12-13: qty 1

## 2020-12-13 MED ORDER — MIDAZOLAM HCL 2 MG/2ML IJ SOLN
INTRAMUSCULAR | Status: AC
  Filled 2020-12-13: qty 2

## 2020-12-13 MED ORDER — SODIUM CHLORIDE 0.9 % IV SOLN: INTRAVENOUS | Status: DC

## 2020-12-13 MED ORDER — MIDAZOLAM HCL 2 MG/2ML IJ SOLN
INTRAMUSCULAR | Status: AC | PRN
  Administered 2020-12-13: 1 mg via INTRAVENOUS

## 2020-12-13 MED ORDER — FENTANYL CITRATE (PF) 100 MCG/2ML IJ SOLN
INTRAMUSCULAR | Status: AC | PRN
  Administered 2020-12-13: 25 ug via INTRAVENOUS

## 2020-12-13 NOTE — Procedures (Signed)
Pre Procedure Dx: Proteinuria and macroalbuminuria Post Procedural Dx: Same  Technically successful US guided biopsy of inferior pole of the right kidney.  EBL: Trace No immediate complications.   Ronny Bacon, MD Pager #: [phone]

## 2020-12-15 ENCOUNTER — Encounter: Payer: Medicare Other | Admitting: Gastroenterology

## 2020-12-21 ENCOUNTER — Other Ambulatory Visit: Payer: Self-pay

## 2020-12-21 ENCOUNTER — Encounter (HOSPITAL_BASED_OUTPATIENT_CLINIC_OR_DEPARTMENT_OTHER): Payer: Self-pay | Admitting: Cardiovascular Disease

## 2020-12-21 ENCOUNTER — Ambulatory Visit (INDEPENDENT_AMBULATORY_CARE_PROVIDER_SITE_OTHER): Payer: Medicare Other | Admitting: Cardiovascular Disease

## 2020-12-21 VITALS — BP 116/64 | HR 66 | Ht 67.0 in | Wt 168.1 lb

## 2020-12-21 DIAGNOSIS — R400 Somnolence: Secondary | ICD-10-CM

## 2020-12-21 DIAGNOSIS — I6521 Occlusion and stenosis of right carotid artery: Secondary | ICD-10-CM | POA: Diagnosis not present

## 2020-12-21 DIAGNOSIS — R5383 Other fatigue: Secondary | ICD-10-CM

## 2020-12-21 DIAGNOSIS — E7800 Pure hypercholesterolemia, unspecified: Secondary | ICD-10-CM | POA: Diagnosis not present

## 2020-12-21 DIAGNOSIS — R0683 Snoring: Secondary | ICD-10-CM

## 2020-12-21 DIAGNOSIS — R0602 Shortness of breath: Secondary | ICD-10-CM

## 2020-12-21 DIAGNOSIS — I10 Essential (primary) hypertension: Secondary | ICD-10-CM | POA: Diagnosis not present

## 2020-12-21 HISTORY — DX: Shortness of breath: R06.02

## 2020-12-21 NOTE — Assessment & Plan Note (Signed)
Lipids are poorly controlled.  She has been on rosuvastatin and reports that she is taking it regularly.  She now has leg heaviness and discomfort that I am worried may be coming from her statins.  She has good peripheral pulses and I do not think it is due to claudication.  She is going to hold her rosuvastatin for couple weeks to see if her symptoms improve.  If they do we may need to switch to an alternative agent.  LDL goal is less than 70 given her carotid stenosis.

## 2020-12-21 NOTE — Assessment & Plan Note (Signed)
Mild carotid stenosis bilaterally 10/2019.  She is due for repeat carotid Dopplers.  We will recheck at this time.

## 2020-12-21 NOTE — Progress Notes (Signed)
Cardiology Office Note:   Date:  12/21/2020   ID:  Jo [name], DOB [birth year], MRN [id number]  PCP:  Horald Pollen, MD  Cardiologist:  Skeet Latch, MD  Nephrologist: Dr. Joelyn Oms, [facility]  Referring MD: Horald Pollen, *   CC: Hypertension, Follow-up  History of Present Illness:    Jo [name] is a 75 y.o. female with a hx of hypertension, hyperlipidemia, and pre-diabetes here for follow-up.  She was initially seen 09/2019 to establish care in the hypertension clinic.  She previously saw Dr. Doylene Canard 09/2017.  At the time she had chest pain.  She had an echo at that time that revealed LVEF 65 to 70% with mild LVH and grade 1 diastolic dysfunction.  Mild calcification was noted of the aortic valve but there was no aortic stenosis.  She also has significant mitral annular calcification.  She had a left heart catheterization that showed no coronary artery disease. Jo [name] was diagnosed with hypertension about 20 years ago.  She was switched from lisinopril to hydrochlorothiazide.  Since that time her primary care doctor also started her on rosuvastatin.  She had carotid Dopplers 10/2019 that revealed mild ICA stenosis bilaterally.    At her last appointment, she had no chest pain with exertion. She had recently been started on rosuvastatin by her PCP. Today, she is feeling terrible and endorses arthralgias. She is feeling more short of breath than baseline, as well as suffering from pain and heaviness in her legs. Her shortness of breath has been ongoing for about a month and occurs with minimal exertion. When she walks up stairs, she is able to continue after stopping and taking a deep breath. She denies associated chest pain. When she gets up from sitting, she feels a lot of pain and pressure in her legs. There is no pain on palpation. At home her blood pressure is typically similar to in clinic today, and sometimes a little higher. Averages of 703-500  systolic. Every day she is exercising by walking her dog 2-3 times a day. During the day she tends to feel exhausted, and falls asleep easily. She is fairly certain that she snores. At night she has urinary frequency from drinking a lot of fluids during the day, which is affecting her nighttime sleep. She has been following her medication regimen religiously. Recently she had a biopsy performed on her kidney. She follows up with her nephrologist Dr. Joelyn Oms on Monday. She denies any palpitations. No lightheadedness, headaches, syncope, orthopnea, PND, or lower extremity edema.  Previous antihypertensives: Lisinopril  Past Medical History:  Diagnosis Date   Allergy    Anemia    Arthritis    right knee   Carotid bruit 10/11/2019   Carotid stenosis 10/11/2019   Cataract    forming both   GERD (gastroesophageal reflux disease)    past hx   Goiter    Hyperlipidemia    Hypertension    Lower extremity edema 10/11/2019   Menopause age 80   Pure hypercholesterolemia 10/11/2019   Reflux    Shortness of breath 12/21/2020   Spinal stenosis     Past Surgical History:  Procedure Laterality Date   CERVICAL SPINE SURGERY  06/15/2010   COLONOSCOPY  2005   normal   LEFT HEART CATH AND CORONARY ANGIOGRAPHY N/A 09/16/2017   Procedure: LEFT HEART CATH AND CORONARY ANGIOGRAPHY;  Surgeon: Dixie Dials, MD;  Location: [facility];  Service: Cardiovascular;  Laterality: N/A;  Current Medications: Current Meds  Medication Sig   acetaminophen (TYLENOL) 325 MG tablet Take 325 mg by mouth every 6 (six) hours as needed for moderate pain.   aspirin EC 81 MG tablet Take 81 mg by mouth at bedtime.   Cholecalciferol (DIALYVITE VITAMIN D 5000) 125 MCG (5000 UT) capsule Take 5,000 Units by mouth at bedtime.   Ferrous Sulfate (IRON SLOW RELEASE PO) Take 1 tablet by mouth daily.   lisinopril-hydrochlorothiazide (ZESTORETIC) 10-12.5 MG tablet Take 1 tablet by mouth once daily   polyethylene glycol  (MIRALAX / GLYCOLAX) packet Take 17 g by mouth daily as needed (constipation). Mix in 8 oz liquid and drink   Probiotic Product (DIGESTIVE ADVANTAGE) CAPS Take 1 capsule by mouth daily.   rosuvastatin (CRESTOR) 40 MG tablet Take 1 tablet (40 mg total) by mouth daily.   vitamin B-12 (CYANOCOBALAMIN) 1000 MCG tablet Take 1,000 mcg by mouth at bedtime.      Allergies:   Demerol   Social History   Socioeconomic History   Marital status: Widowed    Spouse name: Not on file   Number of children: 3   Years of education: college   Highest education level: Not on file  Occupational History   Occupation: CSST    Employer: [employer]  Tobacco Use   Smoking status: Never   Smokeless tobacco: Never  Substance and Sexual Activity   Alcohol use: No    Comment: glass of wine-special occasions   Drug use: No   Sexual activity: Never    Birth control/protection: Abstinence  Other Topics Concern   Not on file  Social History Narrative   Not on file   Social Determinants of Health   Financial Resource Strain: Not on file  Food Insecurity: Not on file  Transportation Needs: Not on file  Physical Activity: Not on file  Stress: Not on file  Social Connections: Not on file     Family History: The patient's family history includes Breast cancer in her sister; CAD in her sister and sister; Cancer in her brother, sister, sister, and another family member; Heart attack in her father; Heart disease in her brother, father, and mother; Hypertension in her daughter, mother, and another family member; Lung cancer in her brother and sister; Stroke in her mother. There is no history of Colon cancer, Colon polyps, Esophageal cancer, Rectal cancer, or Stomach cancer.  ROS:   Please see the history of present illness.    (+) Exertional shortness of breath (+) Bilateral LE pain and heaviness (+) Arthralgias (+) Fatigue (+) Urinary frequency (+) Insomnia (+) Snoring All other systems reviewed and  are negative.  EKGs/Labs/Other Studies Reviewed:    Carotid Doppler 10/22/19: 1-39% ICA stenosis bilaterally.  LHC 09/2017:  Medical treatment.   No indication for antiplatelet therapy at this time.  No CAD  Echo 09/2017: Study Conclusions  - Left ventricle: The cavity size was normal. There was mild    concentric hypertrophy. Systolic function was vigorous. The    estimated ejection fraction was in the range of 65% to 70%. Wall    motion was normal; there were no regional wall motion    abnormalities. Doppler parameters are consistent with abnormal    left ventricular relaxation (grade 1 diastolic dysfunction).  - Aortic valve: Mild focal calcification involving the right    coronary and noncoronary cusp.  - Mitral valve: Moderate focal calcification of the posterior    leaflet. Minimal focal calcification of the anterior leaflet.  Mobility of the posterior leaflet was mildly restricted. The    findings are consistent with trivial stenosis. There was mild    regurgitation.  - Left atrium: The atrium was mildly dilated.   EKG:   12/21/2020: EKG was not ordered. 06/08/2020: SR, rate 67 bpm 12/15/2019: EKG is not ordered today.   Recent Labs: 10/18/2020: ALT 12; BUN 34; Creatinine, Ser 1.90; Potassium 4.6; Sodium 137 12/13/2020: Hemoglobin 8.9; Platelets 199   09/01/2019: Sodium 140, potassium 4.7, BUN 24, creatinine 1.2 AST 20, ALT 13 WBC 8.3, hemoglobin 10.8, hematocrit 35.2, platelets 239  Recent Lipid Panel    Component Value Date/Time   CHOL 161 10/18/2020 1535   CHOL 182 06/21/2020 0809   TRIG 75.0 10/18/2020 1535   HDL 55.30 10/18/2020 1535   HDL 60 06/21/2020 0809   CHOLHDL 3 10/18/2020 1535   VLDL 15.0 10/18/2020 1535   LDLCALC 90 10/18/2020 1535   LDLCALC 109 (H) 06/21/2020 0809    Physical Exam:    VS:  BP 116/64 (BP Location: Right Arm, Patient Position: Sitting, Cuff Size: Normal)   Pulse 66   Ht 5\' 7"  (1.702 m)   Wt 168 lb 1.6 oz (76.2 kg)    SpO2 99%   BMI 26.33 kg/m  , BMI Body mass index is 26.33 kg/m. GENERAL:  Well appearing HEENT: Pupils equal round and reactive, fundi not visualized, oral mucosa unremarkable NECK:  No jugular venous distention, waveform within normal limits, carotid upstroke brisk and symmetric, +R mild carotid bruits LUNGS:  Clear to auscultation bilaterally HEART:  RRR.  PMI not displaced or sustained,S1 and S2 within normal limits, no S3, no S4, no clicks, no rubs, no murmurs ABD:  Flat, positive bowel sounds normal in frequency in pitch, no bruits, no rebound, no guarding, no midline pulsatile mass, no hepatomegaly, no splenomegaly EXT:  2 plus pulses throughout, no edema, no cyanosis no clubbing SKIN:  No rashes no nodules NEURO:  Cranial nerves II through XII grossly intact, motor grossly intact throughout PSYCH:  Cognitively intact, oriented to person place and time   ASSESSMENT:    1. Snoring   2. Stenosis of right carotid artery   3. Essential hypertension   4. Pure hypercholesterolemia   5. Shortness of breath   6. Daytime somnolence   7. Fatigue, unspecified type      ASSESSMENT/PLAN:    Carotid stenosis Mild carotid stenosis bilaterally 10/2019.  She is due for repeat carotid Dopplers.  We will recheck at this time.  Essential hypertension Blood pressure is much better controlled.  However she has proteinuria and worsening renal function.  She is seeing nephrology and had a kidney biopsy.  She has follow-up with them on Monday.  We will not make any changes at this time.  We may need to stop her HCTZ.  She sees Dr. Joelyn Oms at [facility].  Pure hypercholesterolemia Lipids are poorly controlled.  She has been on rosuvastatin and reports that she is taking it regularly.  She now has leg heaviness and discomfort that I am worried may be coming from her statins.  She has good peripheral pulses and I do not think it is due to claudication.  She is going to hold her rosuvastatin for  couple weeks to see if her symptoms improve.  If they do we may need to switch to an alternative agent.  LDL goal is less than 70 given her carotid stenosis.  Shortness of breath Jo [name] reports exertional dyspnea that  is new over the last couple months.  She appears to be euvolemic on exam.  She walks her dog regularly, so I doubt it is due to deconditioning.  She has no exertional chest pain and her coronaries were completely clean on her cath in 2019.  It is not impossible, but seems much less likely that this is due to ischemia.  We will check a BNP.  She also reports snoring and daytime somnolence.  Check a TSH and a sleep study.    Disposition:    FU with APP in 6 weeks. FU with Graciella Arment C. Oval Linsey, MD, [facility] in 4 months.  Medication Adjustments/Labs and Tests Ordered: Current medicines are reviewed at length with the patient today.  Concerns regarding medicines are outlined above.   Orders Placed This Encounter  Procedures   B Nat Peptide   TSH   Split night study   VAS US CAROTID    No orders of the defined types were placed in this encounter.  I,Mathew Stumpf,acting as a Education administrator for Skeet Latch, MD.,have documented all relevant documentation on the behalf of Skeet Latch, MD,as directed by  Skeet Latch, MD while in the presence of Skeet Latch, MD.  I, [place] Oval Linsey, MD have reviewed all documentation for this visit.  The documentation of the exam, diagnosis, procedures, and orders on 12/21/2020 are all accurate and complete.  Signed, Skeet Latch, MD  12/21/2020 3:48 PM    [place]

## 2020-12-21 NOTE — Assessment & Plan Note (Signed)
Blood pressure is much better controlled.  However she has proteinuria and worsening renal function.  She is seeing nephrology and had a kidney biopsy.  She has follow-up with them on Monday.  We will not make any changes at this time.  We may need to stop her HCTZ.  She sees Dr. Joelyn Oms at [facility].

## 2020-12-21 NOTE — Assessment & Plan Note (Signed)
[name] reports exertional dyspnea that is new over the last couple months.  She appears to be euvolemic on exam.  She walks her dog regularly, so I doubt it is due to deconditioning.  She has no exertional chest pain and her coronaries were completely clean on her cath in 2019.  It is not impossible, but seems much less likely that this is due to ischemia.  We will check a BNP.  She also reports snoring and daytime somnolence.  Check a TSH and a sleep study.

## 2020-12-21 NOTE — Patient Instructions (Addendum)
Medication Instructions:  Your physician recommends that you continue on your current medications as directed. Please refer to the Current Medication list given to you today.   *If you need a refill on your cardiac medications before your next appointment, please call your pharmacy*  Lab Work: TSH/BNP TODAY   If you have labs (blood work) drawn today and your tests are completely normal, you will receive your results only by: [place] (if you have MyChart) OR A paper copy in the mail If you have any lab test that is abnormal or we need to change your treatment, we will call you to review the results.  Testing/Procedures: Your physician has recommended that you have a sleep study. This test records several body functions during sleep, including: brain activity, eye movement, oxygen and carbon dioxide blood levels, heart rate and rhythm, breathing rate and rhythm, the flow of air through your mouth and nose, snoring, body muscle movements, and chest and belly movement.  Your physician has requested that you have a carotid duplex. This test is an ultrasound of the carotid arteries in your neck. It looks at blood flow through these arteries that supply the brain with blood. Allow one hour for this exam. There are no restrictions or special instructions.   Follow-Up: At [facility], you and your health needs are our priority.  As part of our continuing mission to provide you with exceptional heart care, we have created designated Provider Care Teams.  These Care Teams include your primary Cardiologist (physician) and Advanced Practice Providers (APPs -  Physician Assistants and Nurse Practitioners) who all work together to provide you with the care you need, when you need it.  We recommend signing up for the patient portal called "MyChart".  Sign up information is provided on this After Visit Summary.  MyChart is used to connect with patients for Virtual Visits (Telemedicine).  Patients  are able to view lab/test results, encounter notes, upcoming appointments, etc.  Non-urgent messages can be sent to your provider as well.   To learn more about what you can do with MyChart, go to [email or web address].    Your next appointment:   4 month(s)  The format for your next appointment:   In Person  Provider:   Skeet Latch, MD  Your physician recommends that you schedule a follow-up appointment in: [place] NP    HOLD YOUR ROSUVASTATIN FOR 2 [place]

## 2020-12-22 LAB — BRAIN NATRIURETIC PEPTIDE: BNP: 52.1 pg/mL (ref 0.0–100.0)

## 2020-12-22 LAB — TSH: TSH: 1.28 u[IU]/mL (ref 0.450–4.500)

## 2020-12-27 ENCOUNTER — Ambulatory Visit: Payer: Medicare Other

## 2020-12-28 ENCOUNTER — Other Ambulatory Visit: Payer: Self-pay

## 2020-12-28 ENCOUNTER — Ambulatory Visit (INDEPENDENT_AMBULATORY_CARE_PROVIDER_SITE_OTHER): Payer: Medicare Other | Admitting: Pharmacist

## 2020-12-28 ENCOUNTER — Telehealth: Payer: Self-pay | Admitting: Pharmacist

## 2020-12-28 VITALS — BP 120/70 | HR 73 | Resp 16 | Ht 67.0 in | Wt 168.2 lb

## 2020-12-28 DIAGNOSIS — E7800 Pure hypercholesterolemia, unspecified: Secondary | ICD-10-CM

## 2020-12-28 DIAGNOSIS — R7303 Prediabetes: Secondary | ICD-10-CM

## 2020-12-28 MED ORDER — ROSUVASTATIN CALCIUM 20 MG PO TABS: 20.0000 mg | ORAL_TABLET | Freq: Every day | ORAL | 1 refills | Status: DC

## 2020-12-28 NOTE — Telephone Encounter (Signed)
Please complete prior authorization for:  Name of medication, dose, and frequency repatha 140mg  sq q 14 days, praluent 75mg  q 14 days  Lab Orders Requested? yes  Which labs? Lipid panel  Estimated date for labs to be scheduled 2-3 months  Does patient need activated copay card? No, but may need [employer] grant

## 2020-12-28 NOTE — Patient Instructions (Addendum)
It was nice meeting you today!  We would like to reduce your LDL by over 50%  Continue holding your rosuvastatin for one more week.  After a week, switch back to rosuvastatin 20mg  once daily  In January we will start you on a new medication called Repatha or Praluent which you will inject once every 2 weeks  After you start the medication, we can recheck your cholesterol in 2 -3 months  We will recheck your A1c today  Please call with any questions  Karren Cobble, PharmD, [place], [place], [place], [place], [place], [address] Phone: [phone], Fax: [phone]

## 2020-12-28 NOTE — Progress Notes (Signed)
[name]                 DOB: Oct 12, 1945                    MRN: [id number]     HPI: Jo [name] is a 75 y.o. female patient referred to lipid clinic by Dr Oval Linsey. PMH is significant for HTN, CKD, carotid artery stenosis and pre DM  although last A1c was 6.5. At last visit with Dr Oval Linsey, rosuvastatin 40mg  was held for 2 weeks due to myalgias.  Patient presents today in good spirits.  Reports has never used tobacco or alcohol.  Has intolerances to atorvastatin and rosuvastatin.  Atorvastatin and rosuvastatin caused myalgias.  Has family history of CAD in father, mother, and siblings.  Current holding rosuvastatin for 7 days and reports she already feels better.  Current Medications:  None. Is holding rosuvastatin 40mg  for 2 weeks Intolerances:  Rosuvastatin Atorvastatin (myalgias)  Risk Factors:  HLD Carotid artery stenosis Pre DM/possible DM  LDL goal: <70  Labs: LDL 90, TC 161, Trigs 75, HDL 55.3 (10/18/20 - On rosuvastatin 20mg  daily)  Past Medical History:  Diagnosis Date   Allergy    Anemia    Arthritis    right knee   Carotid bruit 10/11/2019   Carotid stenosis 10/11/2019   Cataract    forming both   GERD (gastroesophageal reflux disease)    past hx   Goiter    Hyperlipidemia    Hypertension    Lower extremity edema 10/11/2019   Menopause age 65   Pure hypercholesterolemia 10/11/2019   Reflux    Shortness of breath 12/21/2020   Spinal stenosis     Current Outpatient Medications on File Prior to Visit  Medication Sig Dispense Refill   acetaminophen (TYLENOL) 325 MG tablet Take 325 mg by mouth every 6 (six) hours as needed for moderate pain.     aspirin EC 81 MG tablet Take 81 mg by mouth at bedtime.     Cholecalciferol (DIALYVITE VITAMIN D 5000) 125 MCG (5000 UT) capsule Take 5,000 Units by mouth at bedtime.     Ferrous Sulfate (IRON SLOW RELEASE PO) Take 1 tablet by mouth daily.     lisinopril-hydrochlorothiazide  (ZESTORETIC) 10-12.5 MG tablet Take 1 tablet by mouth once daily 90 tablet 0   polyethylene glycol (MIRALAX / GLYCOLAX) packet Take 17 g by mouth daily as needed (constipation). Mix in 8 oz liquid and drink     Probiotic Product (DIGESTIVE ADVANTAGE) CAPS Take 1 capsule by mouth daily. (Patient not taking: Reported on 12/28/2020)     rosuvastatin (CRESTOR) 40 MG tablet Take 1 tablet (40 mg total) by mouth daily. (Patient not taking: Reported on 12/28/2020) 90 tablet 3   vitamin B-12 (CYANOCOBALAMIN) 1000 MCG tablet Take 1,000 mcg by mouth at bedtime.      No current facility-administered medications on file prior to visit.    Allergies  Allergen Reactions   Demerol Nausea And Vomiting    Severe nausea and vomiting   Crestor [Rosuvastatin]     Fatigue. Sob, myalgias, restless legs   Lipitor [Atorvastatin]     myalgias    Assessment/Plan:  1. Hyperlipidemia - Patient most recent LDL 90 whch is above goal of <70.  Unfortunately patient intolerant to statins but needs further lipid lowering.  Would be a candidate for PCSK9i therapy.  Using demo pen, educated patient on mechanism of action, storage, site  selection, administration, and possible adverse effects.  Will complete PA and contact patient when approved.  May need grant assistance. Recheck lipid panel in 2-3 months.  Start Repatha/Praluent sq q 14 days Recheck lipid panel in 2-3 months  Karren Cobble, PharmD, [place], [place], [place], [place], [place], [address] Phone: [phone], Fax: [phone]

## 2020-12-29 LAB — HEMOGLOBIN A1C
Est. average glucose Bld gHb Est-mCnc: 123 mg/dL
Hgb A1c MFr Bld: 5.9 % — ABNORMAL HIGH (ref 4.8–5.6)

## 2021-01-01 ENCOUNTER — Other Ambulatory Visit: Payer: Self-pay | Admitting: Nephrology

## 2021-01-01 DIAGNOSIS — D631 Anemia in chronic kidney disease: Secondary | ICD-10-CM

## 2021-01-01 MED ORDER — REPATHA SURECLICK 140 MG/ML ~~LOC~~ SOAJ: 140.0000 mg | SUBCUTANEOUS | 11 refills | Status: DC

## 2021-01-01 NOTE — Telephone Encounter (Signed)
Called and lmom pt that they were approved for repatha, rx sent,  instructed to complete fasting labs post 4th dose and to call back if the med was cost prohibitive.

## 2021-01-01 NOTE — Telephone Encounter (Signed)
Pa sent for repatha 140mg  q14d, lipid panel ordered and released, will contact pt and determine if a grant is needed after we get the approval from insurance and can send rx to pharmacy to determine pricing.

## 2021-01-01 NOTE — Addendum Note (Signed)
Addended by: Allean Found on: 01/01/2021 07:42 AM   Modules accepted: Orders

## 2021-01-01 NOTE — Addendum Note (Signed)
Addended by: Allean Found on: 01/01/2021 08:55 AM   Modules accepted: Orders

## 2021-01-02 ENCOUNTER — Telehealth: Payer: Self-pay | Admitting: Pharmacist

## 2021-01-02 NOTE — Telephone Encounter (Signed)
Called patient and lmom regarding A1c results

## 2021-01-08 ENCOUNTER — Telehealth: Payer: Self-pay | Admitting: Gastroenterology

## 2021-01-08 MED ORDER — ONDANSETRON HCL 4 MG PO TABS: 4.0000 mg | ORAL_TABLET | Freq: Three times a day (TID) | ORAL | 1 refills | Status: DC | PRN

## 2021-01-08 NOTE — Telephone Encounter (Signed)
Received page to the on-call.  Patient with nausea/vomiting about 20 minutes after starting Suprep for tomorrow's colonoscopy.  Modified plan as follows:  - Will pick up MiraLAX as a substitute prep.  Instructed on how to take MiraLAX prep this evening - Provided Rx for Zofran 4 mg tablets.  To take 1 tablet 30-60 minutes prior to restarting her prep and can take every 6 hours as needed.  Will take 30-60 minutes prior to dose #2. - Continue adequate hydration per protocol - To call on-call if any other issues - All questions answered and appreciative of phone call

## 2021-01-09 ENCOUNTER — Telehealth: Payer: Self-pay

## 2021-01-09 ENCOUNTER — Encounter: Payer: Self-pay | Admitting: Gastroenterology

## 2021-01-09 ENCOUNTER — Other Ambulatory Visit: Payer: Self-pay

## 2021-01-09 ENCOUNTER — Ambulatory Visit (AMBULATORY_SURGERY_CENTER): Payer: Medicare Other | Admitting: Gastroenterology

## 2021-01-09 VITALS — BP 104/48 | HR 61 | Temp 97.8°F | Resp 16 | Ht 66.0 in | Wt 173.0 lb

## 2021-01-09 DIAGNOSIS — R195 Other fecal abnormalities: Secondary | ICD-10-CM

## 2021-01-09 DIAGNOSIS — K5909 Other constipation: Secondary | ICD-10-CM

## 2021-01-09 DIAGNOSIS — Z1211 Encounter for screening for malignant neoplasm of colon: Secondary | ICD-10-CM | POA: Diagnosis not present

## 2021-01-09 DIAGNOSIS — K5730 Diverticulosis of large intestine without perforation or abscess without bleeding: Secondary | ICD-10-CM

## 2021-01-09 DIAGNOSIS — D122 Benign neoplasm of ascending colon: Secondary | ICD-10-CM

## 2021-01-09 DIAGNOSIS — K635 Polyp of colon: Secondary | ICD-10-CM | POA: Diagnosis not present

## 2021-01-09 MED ORDER — SODIUM CHLORIDE 0.9 % IV SOLN: 500.0000 mL | Freq: Once | INTRAVENOUS | Status: DC

## 2021-01-09 NOTE — Telephone Encounter (Signed)
Per Dr. Tarri Glenn orders, pt has been scheduled for anorectal manometry at [facility] on 03/14/21 @ 1030am, arrival time 10am. Amb referral placed for auth purposes. Mailed prep instructions (including date/time/arrival time/location) for anorectal manometry to pt home address.

## 2021-01-09 NOTE — Telephone Encounter (Signed)
-----   Message from [place], MD sent at 01/09/2021 11:20 AM EST ----- Please arrange referral for anorectal manometry for constipation.  Thanks!  KLB

## 2021-01-09 NOTE — Progress Notes (Signed)
Referring Provider: Horald Pollen, * Primary Care Physician:  Horald Pollen, MD  Reason for Procedure:  Positive Cologuard   IMPRESSION:  Need for colon cancer screening Appropriate candidate for monitored anesthesia care  PLAN: Colonoscopy in the [place] today   HPI: Jo [name] is a 75 y.o. female presents for colonoscopy. Cologuard + 2020. Procedure delayed due to Covid.   Chronic constipation on Miralax and probiotics.   Two prior colonoscopies with Dr. Wynetta Emery, the last >10 years ago. No difficulties with prior endoscopy. No personal history of colon polyps.   No known family history of colon cancer or polyps. No family history of uterine/endometrial cancer, pancreatic cancer or gastric/stomach cancer.   Past Medical History:  Diagnosis Date   Allergy    Anemia    Arthritis    right knee   Carotid bruit 10/11/2019   Carotid stenosis 10/11/2019   Cataract    forming both   Chronic kidney disease    GERD (gastroesophageal reflux disease)    past hx   Goiter    Hyperlipidemia    Hypertension    Lower extremity edema 10/11/2019   Menopause age 42   Pure hypercholesterolemia 10/11/2019   Reflux    Shortness of breath 12/21/2020   Spinal stenosis     Past Surgical History:  Procedure Laterality Date   CERVICAL SPINE SURGERY  06/15/2010   COLONOSCOPY  2005   normal   LEFT HEART CATH AND CORONARY ANGIOGRAPHY N/A 09/16/2017   Procedure: LEFT HEART CATH AND CORONARY ANGIOGRAPHY;  Surgeon: Dixie Dials, MD;  Location: [facility];  Service: Cardiovascular;  Laterality: N/A;    Current Outpatient Medications  Medication Sig Dispense Refill   acetaminophen (TYLENOL) 325 MG tablet Take 325 mg by mouth every 6 (six) hours as needed for moderate pain.     aspirin EC 81 MG tablet Take 81 mg by mouth at bedtime.     Cholecalciferol (DIALYVITE VITAMIN D 5000) 125 MCG (5000 UT) capsule Take 5,000 Units by mouth at bedtime.     Evolocumab  (REPATHA SURECLICK) 810 MG/ML SOAJ Inject 140 mg into the skin every 14 (fourteen) days. 2 mL 11   lisinopril-hydrochlorothiazide (ZESTORETIC) 10-12.5 MG tablet Take 1 tablet by mouth once daily 90 tablet 0   ondansetron (ZOFRAN) 4 MG tablet Take 1 tablet (4 mg total) by mouth every 8 (eight) hours as needed for nausea or vomiting. 10 tablet 1   polyethylene glycol (MIRALAX / GLYCOLAX) packet Take 17 g by mouth daily as needed (constipation). Mix in 8 oz liquid and drink     vitamin B-12 (CYANOCOBALAMIN) 1000 MCG tablet Take 1,000 mcg by mouth at bedtime.      Ferrous Sulfate (IRON SLOW RELEASE PO) Take 1 tablet by mouth daily.     Probiotic Product (DIGESTIVE ADVANTAGE) CAPS Take 1 capsule by mouth daily. (Patient not taking: Reported on 12/28/2020)     rosuvastatin (CRESTOR) 20 MG tablet Take 1 tablet (20 mg total) by mouth daily. (Patient not taking: Reported on 01/09/2021) 90 tablet 1   Current Facility-Administered Medications  Medication Dose Route Frequency Provider Last Rate Last Admin   0.9 %  sodium chloride infusion  500 mL Intravenous Once Thornton Park, MD        Allergies as of 01/09/2021 - Review Complete 01/09/2021  Allergen Reaction Noted   Demerol Nausea And Vomiting 02/23/2011   Crestor [rosuvastatin]  12/28/2020   Lipitor [atorvastatin]  12/28/2020  Family History  Problem Relation Age of Onset   Stroke Mother    Hypertension Mother    Heart disease Mother    Heart disease Father    Heart attack Father    Cancer Sister    Breast cancer Sister    CAD Sister    Cancer Sister    CAD Sister    Lung cancer Sister    Cancer Brother    Heart disease Brother    Lung cancer Brother    Hypertension Daughter    Hypertension Other    Cancer Other        lung x 2   Colon cancer Neg Hx    Colon polyps Neg Hx    Esophageal cancer Neg Hx    Rectal cancer Neg Hx    Stomach cancer Neg Hx      Physical Exam: General:   Alert,  well-nourished, pleasant and  cooperative in NAD Head:  Normocephalic and atraumatic. Eyes:  Sclera clear, no icterus.   Conjunctiva pink. Mouth:  No deformity or lesions.   Neck:  Supple; no masses or thyromegaly. Lungs:  Clear throughout to auscultation.   No wheezes. Heart:  Regular rate and rhythm; no murmurs. Abdomen:  Soft, non-tender, nondistended, normal bowel sounds, no rebound or guarding.  Msk:  Symmetrical. No boney deformities LAD: No inguinal or umbilical LAD Extremities:  No clubbing or edema. Neurologic:  Alert and  oriented x4;  grossly nonfocal Skin:  No obvious rash or bruise. Psych:  Alert and cooperative. Normal mood and affect.      Chiyeko Ferre L. Tarri Glenn, MD, MPH 01/09/2021, 10:25 AM

## 2021-01-09 NOTE — Progress Notes (Signed)
Called to room to assist during endoscopic procedure.  Patient ID and intended procedure confirmed with present staff. Received instructions for my participation in the procedure from the performing physician.  

## 2021-01-09 NOTE — Patient Instructions (Signed)

## 2021-01-09 NOTE — Op Note (Signed)
[facility] Patient Name: Jo [name] Procedure Date: 01/09/2021 10:20 AM MRN: [id number] Endoscopist: Thornton Park MD, MD Age: 75 Referring MD:  Date of Birth: 1945/11/23 Gender: Female Account #: [id number] Procedure:                Colonoscopy Indications:              Positive Cologuard test                           Incidental history: chronic constipation with                            incomplete evacuation, straining, and need for                            manual assistance with defecation                           Prior colonoscopy x 2 with Dr. Wynetta Emery, last >10                            years ago Medicines:                Monitored Anesthesia Care Procedure:                Pre-Anesthesia Assessment:                           - Prior to the procedure, a History and Physical                            was performed, and patient medications and                            allergies were reviewed. The patient's tolerance of                            previous anesthesia was also reviewed. The risks                            and benefits of the procedure and the sedation                            options and risks were discussed with the patient.                            All questions were answered, and informed consent                            was obtained. Prior Anticoagulants: The patient has                            taken no previous anticoagulant or antiplatelet                            agents. ASA  Grade Assessment: II - A patient with                            mild systemic disease. After reviewing the risks                            and benefits, the patient was deemed in                            satisfactory condition to undergo the procedure.                           After obtaining informed consent, the colonoscope                            was passed under direct vision. Throughout the                            procedure, the  patient's blood pressure, pulse, and                            oxygen saturations were monitored continuously. The                            CF [id number] #[id number] was introduced through the anus                            and advanced to the 3 cm into the ileum. A second                            forward view of the right colon was performed. The                            colonoscopy was performed without difficulty. The                            patient tolerated the procedure well. The quality                            of the bowel preparation was good. The terminal                            ileum, ileocecal valve, appendiceal orifice, and                            rectum were photographed. Scope In: 10:44:12 AM Scope Out: 11:02:52 AM Scope Withdrawal Time: 0 hours 15 minutes 22 seconds  Total Procedure Duration: 0 hours 18 minutes 40 seconds  Findings:                 The perianal and digital rectal examinations were                            normal.  A 1 mm polyp was found in the ascending colon. The                            polyp was sessile. The polyp was removed with a                            cold snare. Resection and retrieval were complete.                            Estimated blood loss was minimal.                           A few small-mouthed diverticula were found in the                            ascending colon.                           The exam was otherwise without abnormality on                            direct and retroflexion views. Complications:            No immediate complications. Estimated blood loss:                            Minimal. Estimated Blood Loss:     Estimated blood loss was minimal. Impression:               - One 1 mm polyp in the ascending colon, removed                            with a cold snare. Resected and retrieved.                           - Diverticulosis in the ascending colon.                            - The examination was otherwise normal on direct                            and retroflexion views. Recommendation:           - Patient has a contact number available for                            emergencies. The signs and symptoms of potential                            delayed complications were discussed with the                            patient. Return to normal activities tomorrow.  Written discharge instructions were provided to the                            patient.                           - Resume previous diet.                           - Continue present medications.                           - Continue Miralax and probiotics                           - Await pathology results.                           - Referral for anorectal manometry to further                            evaluate the chronic constipation                           - Repeat colonoscopy date to be determined after                            pending pathology results are reviewed for                            surveillance.                           - Follow a high fiber diet. Drink at least 64                            ounces of water daily. Add a daily stool bulking                            agent such as psyllium (an exampled would be                            Metamucil).                           - Emerging evidence supports eating a diet of                            fruits, vegetables, grains, calcium, and yogurt                            while reducing red meat and alcohol may reduce the                            risk of colon cancer.                           -  Thank you for allowing me to be involved in your                            colon cancer prevention. Thornton Park MD, MD 01/09/2021 11:11:50 AM This report has been signed electronically.

## 2021-01-09 NOTE — Telephone Encounter (Signed)
Thanks for your help.

## 2021-01-09 NOTE — Progress Notes (Signed)
PT taken to PACU. Monitors in place. VSS. Report given to RN. 

## 2021-01-11 ENCOUNTER — Telehealth: Payer: Self-pay | Admitting: *Deleted

## 2021-01-11 ENCOUNTER — Encounter: Payer: Self-pay | Admitting: Gastroenterology

## 2021-01-11 ENCOUNTER — Telehealth: Payer: Self-pay

## 2021-01-11 NOTE — Telephone Encounter (Signed)
°  Follow up Call-  Call back number 01/09/2021  Post procedure Call Back phone  # [phone]  Permission to leave phone message No  Some recent data might be hidden     Patient questions:  Do you have a fever, pain , or abdominal swelling? No. Pain Score  0 *  Have you tolerated food without any problems? Yes.    Have you been able to return to your normal activities? Yes.    Do you have any questions about your discharge instructions: Diet   No. Medications  No. Follow up visit  No.  Do you have questions or concerns about your Care? No.  Actions: * If pain score is 4 or above: No action needed, pain <4.  Have you developed a fever since your procedure? no  2.   Have you had an respiratory symptoms (SOB or cough) since your procedure? no  3.   Have you tested positive for COVID 19 since your procedure no  4.   Have you had any family members/close contacts diagnosed with the COVID 19 since your procedure?  no   If yes to any of these questions please route to Joylene John, RN and Joella Prince, RN

## 2021-01-11 NOTE — Telephone Encounter (Signed)
°  Follow up Call-  Call back number 01/09/2021  Post procedure Call Back phone  # [phone]  Permission to leave phone message No  Some recent data might be hidden     Patient questions:  Message left to call us if necessary.

## 2021-01-12 ENCOUNTER — Other Ambulatory Visit: Payer: Self-pay

## 2021-01-12 ENCOUNTER — Ambulatory Visit
Admission: RE | Admit: 2021-01-12 | Discharge: 2021-01-12 | Disposition: A | Discharge status: Home / Self Care | Payer: Medicare Other | Source: Ambulatory Visit | Attending: Nephrology | Admitting: Nephrology

## 2021-01-12 DIAGNOSIS — N189 Chronic kidney disease, unspecified: Secondary | ICD-10-CM

## 2021-01-16 ENCOUNTER — Encounter (HOSPITAL_COMMUNITY): Payer: Self-pay

## 2021-01-16 LAB — SURGICAL PATHOLOGY

## 2021-01-17 ENCOUNTER — Other Ambulatory Visit: Payer: Self-pay

## 2021-01-17 ENCOUNTER — Ambulatory Visit (INDEPENDENT_AMBULATORY_CARE_PROVIDER_SITE_OTHER): Payer: Medicare Other

## 2021-01-17 DIAGNOSIS — I6521 Occlusion and stenosis of right carotid artery: Secondary | ICD-10-CM

## 2021-01-22 ENCOUNTER — Other Ambulatory Visit: Payer: Self-pay | Admitting: *Deleted

## 2021-01-22 DIAGNOSIS — D6489 Other specified anemias: Secondary | ICD-10-CM

## 2021-01-22 LAB — LIPID PANEL
Chol/HDL Ratio: 3.7 ratio (ref 0.0–4.4)
Cholesterol, Total: 212 mg/dL — ABNORMAL HIGH (ref 100–199)
HDL: 57 mg/dL (ref >39)
LDL Chol Calc (NIH): 138 mg/dL — ABNORMAL HIGH (ref 0–99)
Triglycerides: 93 mg/dL (ref 0–149)
VLDL Cholesterol Cal: 17 mg/dL (ref 5–40)

## 2021-01-23 ENCOUNTER — Other Ambulatory Visit: Payer: Self-pay

## 2021-01-23 ENCOUNTER — Inpatient Hospital Stay: Payer: Medicare Other | Attending: Oncology

## 2021-01-23 DIAGNOSIS — Z79899 Other long term (current) drug therapy: Secondary | ICD-10-CM | POA: Diagnosis not present

## 2021-01-23 DIAGNOSIS — D649 Anemia, unspecified: Secondary | ICD-10-CM | POA: Diagnosis not present

## 2021-01-23 DIAGNOSIS — N289 Disorder of kidney and ureter, unspecified: Secondary | ICD-10-CM | POA: Diagnosis not present

## 2021-01-23 DIAGNOSIS — D472 Monoclonal gammopathy: Secondary | ICD-10-CM | POA: Diagnosis not present

## 2021-01-23 DIAGNOSIS — R5383 Other fatigue: Secondary | ICD-10-CM | POA: Diagnosis not present

## 2021-01-23 DIAGNOSIS — N059 Unspecified nephritic syndrome with unspecified morphologic changes: Secondary | ICD-10-CM | POA: Diagnosis not present

## 2021-01-23 DIAGNOSIS — D6489 Other specified anemias: Secondary | ICD-10-CM

## 2021-01-23 LAB — CMP (CANCER CENTER ONLY)
ALT: 7 U/L (ref 0–44)
AST: 14 U/L — ABNORMAL LOW (ref 15–41)
Albumin: 3.7 g/dL (ref 3.5–5.0)
Alkaline Phosphatase: 48 U/L (ref 38–126)
Anion gap: 5 (ref 5–15)
BUN: 27 mg/dL — ABNORMAL HIGH (ref 8–23)
CO2: 27 mmol/L (ref 22–32)
Calcium: 9.5 mg/dL (ref 8.9–10.3)
Chloride: 106 mmol/L (ref 98–111)
Creatinine: 1.44 mg/dL — ABNORMAL HIGH (ref 0.44–1.00)
GFR, Estimated: 38 mL/min — ABNORMAL LOW (ref >60)
Glucose, Bld: 102 mg/dL — ABNORMAL HIGH (ref 70–99)
Potassium: 4 mmol/L (ref 3.5–5.1)
Sodium: 138 mmol/L (ref 135–145)
Total Bilirubin: 0.4 mg/dL (ref 0.3–1.2)
Total Protein: 7 g/dL (ref 6.5–8.1)

## 2021-01-23 LAB — IRON AND IRON BINDING CAPACITY (CC-WL,HP ONLY)
Iron: 49 ug/dL (ref 28–170)
Saturation Ratios: 20 % (ref 10.4–31.8)
TIBC: 249 ug/dL — ABNORMAL LOW (ref 250–450)
UIBC: 200 ug/dL (ref 148–442)

## 2021-01-23 LAB — CBC WITH DIFFERENTIAL (CANCER CENTER ONLY)
Abs Immature Granulocytes: 0.02 10*3/uL (ref 0.00–0.07)
Basophils Absolute: 0 10*3/uL (ref 0.0–0.1)
Basophils Relative: 0 %
Eosinophils Absolute: 0.2 10*3/uL (ref 0.0–0.5)
Eosinophils Relative: 3 %
HCT: 26.2 % — ABNORMAL LOW (ref 36.0–46.0)
Hemoglobin: 8.1 g/dL — ABNORMAL LOW (ref 12.0–15.0)
Immature Granulocytes: 0 %
Lymphocytes Relative: 31 %
Lymphs Abs: 2 10*3/uL (ref 0.7–4.0)
MCH: 29.1 pg (ref 26.0–34.0)
MCHC: 30.9 g/dL (ref 30.0–36.0)
MCV: 94.2 fL (ref 80.0–100.0)
Monocytes Absolute: 0.6 10*3/uL (ref 0.1–1.0)
Monocytes Relative: 9 %
Neutro Abs: 3.7 10*3/uL (ref 1.7–7.7)
Neutrophils Relative %: 57 %
Platelet Count: 221 10*3/uL (ref 150–400)
RBC: 2.78 MIL/uL — ABNORMAL LOW (ref 3.87–5.11)
RDW: 13.9 % (ref 11.5–15.5)
WBC Count: 6.4 10*3/uL (ref 4.0–10.5)
nRBC: 0 % (ref 0.0–0.2)

## 2021-01-23 LAB — FERRITIN: Ferritin: 169 ng/mL (ref 11–307)

## 2021-01-23 LAB — VITAMIN B12: Vitamin B-12: 1216 pg/mL — ABNORMAL HIGH (ref 180–914)

## 2021-01-24 LAB — KAPPA/LAMBDA LIGHT CHAINS
Kappa free light chain: 53.2 mg/L — ABNORMAL HIGH (ref 3.3–19.4)
Kappa, lambda light chain ratio: 1.72 — ABNORMAL HIGH (ref 0.26–1.65)
Lambda free light chains: 31 mg/L — ABNORMAL HIGH (ref 5.7–26.3)

## 2021-01-25 ENCOUNTER — Other Ambulatory Visit: Payer: Self-pay | Admitting: Emergency Medicine

## 2021-01-25 DIAGNOSIS — I10 Essential (primary) hypertension: Secondary | ICD-10-CM

## 2021-01-25 LAB — ERYTHROPOIETIN: Erythropoietin: 20.2 m[IU]/mL — ABNORMAL HIGH (ref 2.6–18.5)

## 2021-01-29 LAB — MULTIPLE MYELOMA PANEL, SERUM
Albumin SerPl Elph-Mcnc: 3.4 g/dL (ref 2.9–4.4)
Albumin/Glob SerPl: 1.1 (ref 0.7–1.7)
Alpha 1: 0.2 g/dL (ref 0.0–0.4)
Alpha2 Glob SerPl Elph-Mcnc: 0.7 g/dL (ref 0.4–1.0)
B-Globulin SerPl Elph-Mcnc: 0.9 g/dL (ref 0.7–1.3)
Gamma Glob SerPl Elph-Mcnc: 1.5 g/dL (ref 0.4–1.8)
Globulin, Total: 3.3 g/dL (ref 2.2–3.9)
IgA: 193 mg/dL (ref 64–422)
IgG (Immunoglobin G), Serum: 1480 mg/dL (ref 586–1602)
IgM (Immunoglobulin M), Srm: 62 mg/dL (ref 26–217)
M Protein SerPl Elph-Mcnc: 0.3 g/dL — ABNORMAL HIGH
Total Protein ELP: 6.7 g/dL (ref 6.0–8.5)

## 2021-01-30 ENCOUNTER — Other Ambulatory Visit: Payer: Self-pay

## 2021-01-30 ENCOUNTER — Inpatient Hospital Stay (HOSPITAL_BASED_OUTPATIENT_CLINIC_OR_DEPARTMENT_OTHER): Payer: Medicare Other | Admitting: Oncology

## 2021-01-30 VITALS — BP 128/72 | HR 78 | Temp 97.9°F | Resp 18 | Ht 66.0 in | Wt 167.9 lb

## 2021-01-30 DIAGNOSIS — D649 Anemia, unspecified: Secondary | ICD-10-CM | POA: Insufficient documentation

## 2021-01-30 DIAGNOSIS — D472 Monoclonal gammopathy: Secondary | ICD-10-CM | POA: Diagnosis not present

## 2021-01-30 DIAGNOSIS — D6489 Other specified anemias: Secondary | ICD-10-CM | POA: Insufficient documentation

## 2021-01-30 DIAGNOSIS — D539 Nutritional anemia, unspecified: Secondary | ICD-10-CM | POA: Insufficient documentation

## 2021-01-30 NOTE — Progress Notes (Signed)
Office Visit    Patient Name: Jo [name] Date of Encounter: 02/01/2021  PCP:  Horald Pollen, [place]  Cardiologist:  Skeet Latch, MD  Advanced Practice Provider:  No care team member to display Electrophysiologist:  None   Chief Complaint    Jo [name] is a 76 y.o. female with a hx of hypertension, hyperlipidemia, and prediabetes presents today for follow-up appointment.   Past Medical History    Past Medical History:  Diagnosis Date   Allergy    Anemia    Arthritis    right knee   Carotid bruit 10/11/2019   Carotid stenosis 10/11/2019   Cataract    forming both   Chronic kidney disease    GERD (gastroesophageal reflux disease)    past hx   Goiter    Hyperlipidemia    Hypertension    Lower extremity edema 10/11/2019   Menopause age 52   Pure hypercholesterolemia 10/11/2019   Reflux    Shortness of breath 12/21/2020   Spinal stenosis    Past Surgical History:  Procedure Laterality Date   CERVICAL SPINE SURGERY  06/15/2010   COLONOSCOPY  2005   normal   LEFT HEART CATH AND CORONARY ANGIOGRAPHY N/A 09/16/2017   Procedure: LEFT HEART CATH AND CORONARY ANGIOGRAPHY;  Surgeon: Dixie Dials, MD;  Location: [facility];  Service: Cardiovascular;  Laterality: N/A;    Allergies  Allergies  Allergen Reactions   Demerol Nausea And Vomiting    Severe nausea and vomiting   Crestor [Rosuvastatin]     Fatigue. Sob, myalgias, restless legs   Lipitor [Atorvastatin]     myalgias    History of Present Illness    Jo [name] is a 76 y.o. female with a hx of hypertension, hyperlipidemia, and prediabetes presents today for follow-up appointment.   She was initially seen 09/2019 to establish care in the hypertension clinic with Dr. Skeet Latch.  She had previously seen Dr. Doylene Canard 09/2017 for chest pain.  Echocardiogram at that time revealed LVEF 65 to 70% with mild LVH and grade 1 DD.  He  had mild aortic valve calcification but no aortic stenosis.  There was also significant mitral annular calcification.  She had a left heart catheterization that showed no CAD.  She also had carotid Dopplers done 10/2019 that revealed mild ICA stenosis bilaterally.  During her last appointment on 12/21/2020 she was feeling terrible and endorsed arthralgias.  She was feeling more short of breath at baseline and was suffering from pain and heaviness in her legs.  Her shortness of breath have been going on for about a month and occurred with minimal exertion.  She denied associated chest pain.  When she gets up from sitting, she was feeling a lot of pain and pressure in her legs.  Her average systolic blood pressure was 110-120.  She was exercising by walking her dog 2-3 times a day.  She had a kidney biopsy performed and she sees Dr. Joelyn Oms.  Today, she feels okay with no new symptoms. She does has some fatigue which she attributes to her anemia. Her recent hemoglobin was 8.1 which was drawn on 01/23/21. She was told by heme/onc that she would need iron injections. We reviewed her recent lipid panel which revealed LDL not at goal. She has not been on her Crestor since December and has only had 2 shots of her Repatha. She is committed to lifestyle changes  like increasing her activity level and making some dietary changes to help lower her cholesterol.   Reports no shortness of breath nor dyspnea on exertion. Reports no chest pain, pressure, or tightness. No edema, orthopnea, PND. Reports no palpitations.     EKGs/Labs/Other Studies Reviewed:   The following studies were reviewed today:  Carotid Doppler 01/2021  Summary:  Right Carotid: Velocities in the right ICA are consistent with a stable  1-39%                 stenosis.   Left Carotid: Velocities in the left ICA are now consistent with a 40-59%                stenosis. ICA velocites have increased since prior exam.   Vertebrals:  Bilateral  vertebral arteries demonstrate antegrade flow.  Subclavians: Normal flow hemodynamics were seen in bilateral subclavian               arteries.   *See table(s) above for measurements and observations.  Suggest follow up study in 12 months.  Carotid Doppler 10/22/19: 1-39% ICA stenosis bilaterally.   LHC 09/2017:  Medical treatment.   No indication for antiplatelet therapy at this time.   No CAD   Echo 09/2017: Study Conclusions  - Left ventricle: The cavity size was normal. There was mild    concentric hypertrophy. Systolic function was vigorous. The    estimated ejection fraction was in the range of 65% to 70%. Wall    motion was normal; there were no regional wall motion    abnormalities. Doppler parameters are consistent with abnormal    left ventricular relaxation (grade 1 diastolic dysfunction).  - Aortic valve: Mild focal calcification involving the right    coronary and noncoronary cusp.  - Mitral valve: Moderate focal calcification of the posterior    leaflet. Minimal focal calcification of the anterior leaflet.    Mobility of the posterior leaflet was mildly restricted. The    findings are consistent with trivial stenosis. There was mild    regurgitation.  - Left atrium: The atrium was mildly dilated.   EKG:  EKG is not ordered today.  Recent Labs: 12/21/2020: BNP 52.1; TSH 1.280 01/23/2021: ALT 7; BUN 27; Creatinine 1.44; Hemoglobin 8.1; Platelet Count 221; Potassium 4.0; Sodium 138  Recent Lipid Panel    Component Value Date/Time   CHOL 212 (H) 01/22/2021 1138   TRIG 93 01/22/2021 1138   HDL 57 01/22/2021 1138   CHOLHDL 3.7 01/22/2021 1138   CHOLHDL 3 10/18/2020 1535   VLDL 15.0 10/18/2020 1535   LDLCALC 138 (H) 01/22/2021 1138    Home Medications   Current Meds  Medication Sig   acetaminophen (TYLENOL) 325 MG tablet Take 325 mg by mouth every 6 (six) hours as needed for moderate pain.   aspirin EC 81 MG tablet Take 81 mg by mouth at bedtime.    Cholecalciferol (DIALYVITE VITAMIN D 5000) 125 MCG (5000 UT) capsule Take 5,000 Units by mouth at bedtime.   Evolocumab (REPATHA SURECLICK) 106 MG/ML SOAJ Inject 140 mg into the skin every 14 (fourteen) days.   Ferrous Sulfate (IRON SLOW RELEASE PO) Take 1 tablet by mouth daily.   lisinopril-hydrochlorothiazide (ZESTORETIC) 10-12.5 MG tablet Take 1 tablet by mouth once daily   ondansetron (ZOFRAN) 4 MG tablet Take 1 tablet (4 mg total) by mouth every 8 (eight) hours as needed for nausea or vomiting.   polyethylene glycol (MIRALAX / GLYCOLAX) packet Take 17 g by  mouth daily as needed (constipation). Mix in 8 oz liquid and drink   Probiotic Product (DIGESTIVE ADVANTAGE) CAPS Take 1 capsule by mouth daily.   vitamin B-12 (CYANOCOBALAMIN) 1000 MCG tablet Take 1,000 mcg by mouth at bedtime.    [DISCONTINUED] rosuvastatin (CRESTOR) 20 MG tablet Take 1 tablet (20 mg total) by mouth daily.     Review of Systems      All other systems reviewed and are otherwise negative except as noted above.  Physical Exam    VS:  BP 100/60 (BP Location: Left Arm, Patient Position: Sitting, Cuff Size: Normal)    Pulse 80    Ht 5\' 6"  (1.676 m)    Wt 166 lb (75.3 kg)    SpO2 98%    BMI 26.79 kg/m  , BMI Body mass index is 26.79 kg/m.  Wt Readings from Last 3 Encounters:  02/01/21 166 lb (75.3 kg)  01/30/21 167 lb 14.4 oz (76.2 kg)  01/09/21 173 lb (78.5 kg)     GEN: Well nourished, well developed, in no acute distress. HEENT: normal. Neck: Supple, no JVD, carotid bruits, or masses. Cardiac: RRR, no murmurs, rubs, or gallops. No clubbing, cyanosis, edema.  Radials/PT 2+ and equal bilaterally.  Respiratory:  Respirations regular and unlabored, clear to auscultation bilaterally. GI: Soft, nontender, nondistended. MS: No deformity or atrophy. Skin: Warm and dry, no rash. Neuro:  Strength and sensation are intact. Psych: Normal affect.  Assessment & Plan    Carotid stenosis -Mild carotid stenosis  bilaterally on ultrasound 10/2019 Ultrasound repeated 01/2021 which showed mild right carotid stenosis but progression of left carotid stenosis 45 to 59% -Continue Repatha -Will recheck Lipid panel in 2 months before her f/u appointment  Essential hypertension -BP is low normal today but she is asymptomatic -Continue to monitor your BP at home  Hyperlipidemia -Recent lipid panel 01/2021 showed total cholesterol 212, HDL 57, LDL 138, triglycerides 93 -LDL remains above goal -She is on Repatha but she has not taken her Crestor since December and has only had two doses of her Repatha -She would like to continue to lifestyle modifications and re-draw lipids before her appointment in April with Dr. Oval Linsey  Shortness of breath -This has resolved.  5. Fatigue -Likely due to her anemia   Disposition: Follow up 2-3 months with Skeet Latch, MD or APP.  Signed, Elgie Collard, PA-C 02/01/2021, 12:12 PM [place]

## 2021-01-30 NOTE — Progress Notes (Signed)
Hematology and Oncology Follow Up Visit  Jo [name] [id number] [birth year] 76 y.o. 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  He was found to have M spike of 0.2 g/dL without endorgan damage.     Current therapy: Active surveillance  Interim History: [name] returns today for a follow-up visit.  Since the last visit, she reports no major changes in her health.  She denies any recent hospitalizations or illnesses.  She denies any shortness of breath or difficulty breathing.  She has reported excessive fatigue at times.  Her performance status and quality of life remains unchanged.    Medications: I have reviewed the patient's current medications.  Current Outpatient Medications  Medication Sig Dispense Refill   acetaminophen (TYLENOL) 325 MG tablet Take 325 mg by mouth every 6 (six) hours as needed for moderate pain.     aspirin EC 81 MG tablet Take 81 mg by mouth at bedtime.     Cholecalciferol (DIALYVITE VITAMIN D 5000) 125 MCG (5000 UT) capsule Take 5,000 Units by mouth at bedtime.     Evolocumab (REPATHA SURECLICK) 258 MG/ML SOAJ Inject 140 mg into the skin every 14 (fourteen) days. 2 mL 11   Ferrous Sulfate (IRON SLOW RELEASE PO) Take 1 tablet by mouth daily.     lisinopril-hydrochlorothiazide (ZESTORETIC) 10-12.5 MG tablet Take 1 tablet by mouth once daily 90 tablet 0   ondansetron (ZOFRAN) 4 MG tablet Take 1 tablet (4 mg total) by mouth every 8 (eight) hours as needed for nausea or vomiting. 10 tablet 1   polyethylene glycol (MIRALAX / GLYCOLAX) packet Take 17 g by mouth daily as needed (constipation). Mix in 8 oz liquid and drink     Probiotic Product (DIGESTIVE ADVANTAGE) CAPS Take 1 capsule by mouth daily. (Patient not taking: Reported on 12/28/2020)     rosuvastatin (CRESTOR) 20 MG tablet Take 1 tablet (20 mg total) by mouth daily. (Patient not taking: Reported on  01/09/2021) 90 tablet 1   vitamin B-12 (CYANOCOBALAMIN) 1000 MCG tablet Take 1,000 mcg by mouth at bedtime.      No current facility-administered medications for this visit.     Allergies:  Allergies  Allergen Reactions   Demerol Nausea And Vomiting    Severe nausea and vomiting   Crestor [Rosuvastatin]     Fatigue. Sob, myalgias, restless legs   Lipitor [Atorvastatin]     myalgias      Physical Exam:   Blood pressure 128/72, pulse 78, temperature 97.9 F (36.6 C), temperature source Temporal, resp. rate 18, height _0  (1.676 m), weight 167 lb 14.4 oz (76.2 kg), SpO2 100 %.  ECOG: 1    General appearance: Comfortable appearing without any discomfort Head: Normocephalic without any trauma Oropharynx: Mucous membranes are moist and pink without any thrush or ulcers. Eyes: Pupils are equal and round reactive to light. Lymph nodes: No cervical, supraclavicular, inguinal or axillary lymphadenopathy.   Heart:regular rate and rhythm.  S1 and S2 without leg edema. Lung: Clear without any rhonchi or wheezes.  No dullness to percussion. Abdomin: Soft, nontender, nondistended with good bowel sounds.  No hepatosplenomegaly. Musculoskeletal: No joint deformity or effusion.  Full range of motion noted. Neurological: No deficits noted on motor, sensory and deep tendon reflex exam. Skin: No petechial rash or dryness.  Appeared moist.      Lab Results: Lab Results  Component Value Date   WBC  6.4 01/23/2021   HGB 8.1 (L) 01/23/2021   HCT 26.2 (L) 01/23/2021   MCV 94.2 01/23/2021   PLT 221 01/23/2021     Chemistry      Component Value Date/Time   NA 138 01/23/2021 1108   NA 141 06/21/2020 0809   K 4.0 01/23/2021 1108   CL 106 01/23/2021 1108   CO2 27 01/23/2021 1108   BUN 27 (H) 01/23/2021 1108   BUN 40 (H) 06/21/2020 0809   CREATININE 1.44 (H) 01/23/2021 1108   CREATININE 0.89 12/13/2015 1102   GLU 88 07/25/2010 0000      Component Value Date/Time   CALCIUM 9.5  01/23/2021 1108   ALKPHOS 48 01/23/2021 1108   AST 14 (L) 01/23/2021 1108   ALT 7 01/23/2021 1108   BILITOT 0.4 01/23/2021 1108       Latest Reference Range & Units 01/23/21 11:08  M Protein SerPl Elph-Mcnc Not Observed g/dL 0.3 (H) (C)  IFE 1  Comment ! (C)  Globulin, Total 2.2 - 3.9 g/dL 3.3 (C)  B-Globulin SerPl Elph-Mcnc 0.7 - 1.3 g/dL 0.9 (C)  IgG (Immunoglobin G), Serum 586 - 1,602 mg/dL 1,480  IgM (Immunoglobulin M), Srm 26 - 217 mg/dL 62  IgA 64 - 422 mg/dL 193  (H): Data is abnormally high !: Data is abnormal (C): Corrected  Impression and Plan:  76 year old woman with:  1.  IgG kappa MGUS diagnosed in 2022.     Protein studies obtained on January 2023 were reviewed and showed normal quantitative immunoglobulins and a very small M spike of 0.3 g/dL.  I do not think this represents symptomatic multiple myeloma at this time.  At this time I recommended continued active surveillance.  We will repeat studies in 1 year.     2.  Anemia: Iron studies obtained on January 23, 2021 showed iron level of 49 and ferritin of 169. B12 is within normal range.  Her erythropoietin is mildly elevated but not appropriately so given the degree of anemia.  His anemia is predominantly related to renal failure and would benefit from growth factor support.  Risks and benefits of the Aranesp breath factor support were discussed at this time.  Complications including thromboembolism and hypertension were reviewed.  After discussion she is agreeable at this time.  3.  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  She continues to follow with nephrology.     4.  Follow-up: will be in the near future to start Aranesp and MD follow-up in 4 months.    30  minutes were spent on this encounter.  Time was dedicated to reviewing laboratory data, disease status update, treatment choices and future plan of care  discussion.    Zola Button, MD 1/17/20239:44 AM

## 2021-02-01 ENCOUNTER — Encounter (HOSPITAL_BASED_OUTPATIENT_CLINIC_OR_DEPARTMENT_OTHER): Payer: Self-pay | Admitting: Physician Assistant

## 2021-02-01 ENCOUNTER — Other Ambulatory Visit: Payer: Self-pay

## 2021-02-01 ENCOUNTER — Ambulatory Visit (INDEPENDENT_AMBULATORY_CARE_PROVIDER_SITE_OTHER): Payer: Medicare Other | Admitting: Physician Assistant

## 2021-02-01 VITALS — BP 100/60 | HR 80 | Ht 66.0 in | Wt 166.0 lb

## 2021-02-01 DIAGNOSIS — I10 Essential (primary) hypertension: Secondary | ICD-10-CM | POA: Diagnosis not present

## 2021-02-01 DIAGNOSIS — I6521 Occlusion and stenosis of right carotid artery: Secondary | ICD-10-CM | POA: Diagnosis not present

## 2021-02-01 DIAGNOSIS — N1831 Chronic kidney disease, stage 3a: Secondary | ICD-10-CM | POA: Diagnosis not present

## 2021-02-01 DIAGNOSIS — R0602 Shortness of breath: Secondary | ICD-10-CM

## 2021-02-01 DIAGNOSIS — R5383 Other fatigue: Secondary | ICD-10-CM

## 2021-02-01 DIAGNOSIS — E785 Hyperlipidemia, unspecified: Secondary | ICD-10-CM

## 2021-02-01 NOTE — Patient Instructions (Addendum)
Medication Instructions:  Continue your current medications.   *If you need a refill on your cardiac medications before your next appointment, please call your pharmacy*  Lab Work: Your physician recommends that you return for lab work in 2 months for fasting lipid panel and CMET.   Your provider has recommended lab work. Please have this collected at [facility] at [place]. The lab is open 8:00 am - 4:30 pm. Please avoid 12:00p - 1:00p for lunch hour. You do not need an appointment. Please go to [address] [place], [place] [id number]. This is in the Primary Care office on the 3rd floor, let them know you are there for blood work and they will direct you to the lab.   If you have labs (blood work) drawn today and your tests are completely normal, you will receive your results only by: [place] (if you have MyChart) OR A paper copy in the mail If you have any lab test that is abnormal or we need to change your treatment, we will call you to review the results.   Testing/Procedures: None ordered today.    Follow-Up: At [facility], you and your health needs are our priority.  As part of our continuing mission to provide you with exceptional heart care, we have created designated Provider Care Teams.  These Care Teams include your primary Cardiologist (physician) and Advanced Practice Providers (APPs -  Physician Assistants and Nurse Practitioners) who all work together to provide you with the care you need, when you need it.  We recommend signing up for the patient portal called "MyChart".  Sign up information is provided on this After Visit Summary.  MyChart is used to connect with patients for Virtual Visits (Telemedicine).  Patients are able to view lab/test results, encounter notes, upcoming appointments, etc.  Non-urgent messages can be sent to your provider as well.   To learn more about what you can do with MyChart, go to [email or web address].     Your next appointment:   As scheduled in April with Dr. Oval Linsey   Other Instructions  Heart Healthy Diet Recommendations: A low-salt diet is recommended. Meats should be grilled, baked, or boiled. Avoid fried foods. Focus on lean protein sources like fish or chicken with vegetables and fruits. The American Heart Association is a Microbiologist!  American Heart Association Diet and Lifeystyle Recommendations    Exercise recommendations: The American Heart Association recommends 150 minutes of moderate intensity exercise weekly. Try 30 minutes of moderate intensity exercise 4-5 times per week. This could include walking, jogging, or swimming.   Preventing High Cholesterol Cholesterol is a white, waxy substance similar to fat that the human body needs to help build cells. The liver makes all the cholesterol that a person's body needs. Having high cholesterol (hypercholesterolemia) increases your risk for heart disease and stroke. Extra or excess cholesterol comes from the food that you eat. High cholesterol can often be prevented with diet and lifestyle changes. If you already have high cholesterol, you can control it with diet, lifestyle changes, and medicines. How can high cholesterol affect me? If you have high cholesterol, fatty deposits (plaques) may build up on the walls of your blood vessels. The blood vessels that carry blood away from your heart are called arteries. Plaques make the arteries narrower and stiffer. This in turn can: Restrict or block blood flow and cause blood clots to form. Increase your risk for heart attack and stroke. What can increase my risk for  high cholesterol? This condition is more likely to develop in people who: Eat foods that are high in saturated fat or cholesterol. Saturated fat is mostly found in foods that come from animal sources. Are overweight. Are not getting enough exercise. Use products that contain nicotine or tobacco, such as cigarettes,  e-cigarettes, and chewing tobacco. Have a family history of high cholesterol (familial hypercholesterolemia). What actions can I take to prevent this? Nutrition  Eat less saturated fat. Avoid trans fats (partially hydrogenated oils). These are often found in margarine and in some baked goods, fried foods, and snacks bought in packages. Avoid precooked or cured meat, such as bacon, sausages, or meat loaves. Avoid foods and drinks that have added sugars. Eat more fruits, vegetables, and whole grains. Choose healthy sources of protein, such as fish, poultry, lean cuts of red meat, beans, peas, lentils, and nuts. Choose healthy sources of fat, such as: Nuts. Vegetable oils, especially olive oil. Fish that have healthy fats, such as omega-3 fatty acids. These fish include mackerel or salmon. Lifestyle Lose weight if you are overweight. Maintaining a healthy body mass index (BMI) can help prevent or control high cholesterol. It can also lower your risk for diabetes and high blood pressure. Ask your health care provider to help you with a diet and exercise plan to lose weight safely. Do not use any products that contain nicotine or tobacco. These products include cigarettes, chewing tobacco, and vaping devices, such as e-cigarettes. If you need help quitting, ask your health care provider. Alcohol use Do not drink alcohol if: Your health care provider tells you not to drink. You are pregnant, may be pregnant, or are planning to become pregnant. If you drink alcohol: Limit how much you have to: 0-1 drink a day for women. 0-2 drinks a day for men. Know how much alcohol is in your drink. In the [place] one drink equals one 12 oz bottle of beer (355 mL), one 5 oz glass of wine (148 mL), or one 1 oz glass of hard liquor (44 mL). Activity  Get enough exercise. Do exercises as told by your health care provider. Each week, do at least 150 minutes of exercise that takes a medium level of effort  (moderate-intensity exercise). This kind of exercise: Makes your heart beat faster while allowing you to still be able to talk. Can be done in short sessions several times a day or longer sessions a few times a week. For example, on 5 days each week, you could walk fast or ride your bike 3 times a day for 10 minutes each time. Medicines Your health care provider may recommend medicines to help lower cholesterol. This may be a medicine to lower the amount of cholesterol that your liver makes. You may need medicine if: Diet and lifestyle changes have not lowered your cholesterol enough. You have high cholesterol and other risk factors for heart disease or stroke. Take over-the-counter and prescription medicines only as told by your health care provider. General information Manage your risk factors for high cholesterol. Talk with your health care provider about all your risk factors and how to lower your risk. Manage other conditions that you have, such as diabetes or high blood pressure (hypertension). Have blood tests to check your cholesterol levels at regular points in time as told by your health care provider. Keep all follow-up visits. This is important. Where to find more information American Heart Association: www.heart.org National Heart, Lung, and Blood Institute: https://wilson-eaton.com/ Summary High cholesterol increases your  risk for heart disease and stroke. By keeping your cholesterol level low, you can reduce your risk for these conditions. High cholesterol can often be prevented with diet and lifestyle changes. Work with your health care provider to manage your risk factors, and have your blood tested regularly. This information is not intended to replace advice given to you by your health care provider. Make sure you discuss any questions you have with your health care provider. Document Revised: 03/06/2020 Document Reviewed: 03/06/2020 Elsevier Patient Education  2022 [employer].

## 2021-02-06 ENCOUNTER — Other Ambulatory Visit: Payer: Self-pay

## 2021-02-06 ENCOUNTER — Inpatient Hospital Stay: Payer: Medicare Other

## 2021-02-06 VITALS — BP 123/70 | HR 76 | Temp 98.9°F | Resp 17

## 2021-02-06 DIAGNOSIS — D6489 Other specified anemias: Secondary | ICD-10-CM

## 2021-02-06 DIAGNOSIS — D472 Monoclonal gammopathy: Secondary | ICD-10-CM | POA: Diagnosis not present

## 2021-02-06 LAB — CBC WITH DIFFERENTIAL (CANCER CENTER ONLY)
Abs Immature Granulocytes: 0.04 10*3/uL (ref 0.00–0.07)
Basophils Absolute: 0 10*3/uL (ref 0.0–0.1)
Basophils Relative: 0 %
Eosinophils Absolute: 0.1 10*3/uL (ref 0.0–0.5)
Eosinophils Relative: 1 %
HCT: 26.6 % — ABNORMAL LOW (ref 36.0–46.0)
Hemoglobin: 8.6 g/dL — ABNORMAL LOW (ref 12.0–15.0)
Immature Granulocytes: 0 %
Lymphocytes Relative: 30 %
Lymphs Abs: 2.7 10*3/uL (ref 0.7–4.0)
MCH: 29.4 pg (ref 26.0–34.0)
MCHC: 32.3 g/dL (ref 30.0–36.0)
MCV: 90.8 fL (ref 80.0–100.0)
Monocytes Absolute: 0.8 10*3/uL (ref 0.1–1.0)
Monocytes Relative: 9 %
Neutro Abs: 5.3 10*3/uL (ref 1.7–7.7)
Neutrophils Relative %: 60 %
Platelet Count: 264 10*3/uL (ref 150–400)
RBC: 2.93 MIL/uL — ABNORMAL LOW (ref 3.87–5.11)
RDW: 13.6 % (ref 11.5–15.5)
WBC Count: 9 10*3/uL (ref 4.0–10.5)
nRBC: 0 % (ref 0.0–0.2)

## 2021-02-06 MED ORDER — DARBEPOETIN ALFA 300 MCG/0.6ML IJ SOSY
300.0000 ug | PREFILLED_SYRINGE | Freq: Once | INTRAMUSCULAR | Status: AC
  Administered 2021-02-06: 14:00:00 300 ug via SUBCUTANEOUS
  Filled 2021-02-06: qty 0.6

## 2021-02-06 NOTE — Patient Instructions (Signed)
Darbepoetin Alfa injection ?What is this medication? ?DARBEPOETIN ALFA (dar be POE e tin  AL fa) helps your body make more red blood cells. It is used to treat anemia caused by chronic kidney failure and chemotherapy. ?This medicine may be used for other purposes; ask your health care provider or pharmacist if you have questions. ?COMMON BRAND NAME(S): Aranesp ?What should I tell my care team before I take this medication? ?They need to know if you have any of these conditions: ?blood clotting disorders or history of blood clots ?cancer patient not on chemotherapy ?cystic fibrosis ?heart disease, such as angina, heart failure, or a history of a heart attack ?hemoglobin level of 12 g/dL or greater ?high blood pressure ?low levels of folate, iron, or vitamin B12 ?seizures ?an unusual or allergic reaction to darbepoetin, erythropoietin, albumin, hamster proteins, latex, other medicines, foods, dyes, or preservatives ?pregnant or trying to get pregnant ?breast-feeding ?How should I use this medication? ?This medicine is for injection into a vein or under the skin. It is usually given by a health care professional in a hospital or clinic setting. ?If you get this medicine at home, you will be taught how to prepare and give this medicine. Use exactly as directed. Take your medicine at regular intervals. Do not take your medicine more often than directed. ?It is important that you put your used needles and syringes in a special sharps container. Do not put them in a trash can. If you do not have a sharps container, call your pharmacist or healthcare provider to get one. ?A special MedGuide will be given to you by the pharmacist with each prescription and refill. Be sure to read this information carefully each time. ?Talk to your pediatrician regarding the use of this medicine in children. While this medicine may be used in children as young as 1 month of age for selected conditions, precautions do apply. ?Overdosage: If  you think you have taken too much of this medicine contact a poison control center or emergency room at once. ?NOTE: This medicine is only for you. Do not share this medicine with others. ?What if I miss a dose? ?If you miss a dose, take it as soon as you can. If it is almost time for your next dose, take only that dose. Do not take double or extra doses. ?What may interact with this medication? ?Do not take this medicine with any of the following medications: ?epoetin alfa ?This list may not describe all possible interactions. Give your health care provider a list of all the medicines, herbs, non-prescription drugs, or dietary supplements you use. Also tell them if you smoke, drink alcohol, or use illegal drugs. Some items may interact with your medicine. ?What should I watch for while using this medication? ?Your condition will be monitored carefully while you are receiving this medicine. ?You may need blood work done while you are taking this medicine. ?This medicine may cause a decrease in vitamin B6. You should make sure that you get enough vitamin B6 while you are taking this medicine. Discuss the foods you eat and the vitamins you take with your health care professional. ?What side effects may I notice from receiving this medication? ?Side effects that you should report to your doctor or health care professional as soon as possible: ?allergic reactions like skin rash, itching or hives, swelling of the face, lips, or tongue ?breathing problems ?changes in vision ?chest pain ?confusion, trouble speaking or understanding ?feeling faint or lightheaded, falls ?high blood   pressure ?muscle aches or pains ?pain, swelling, warmth in the leg ?rapid weight gain ?severe headaches ?sudden numbness or weakness of the face, arm or leg ?trouble walking, dizziness, loss of balance or coordination ?seizures (convulsions) ?swelling of the ankles, feet, hands ?unusually weak or tired ?Side effects that usually do not require  medical attention (report to your doctor or health care professional if they continue or are bothersome): ?diarrhea ?fever, chills (flu-like symptoms) ?headaches ?nausea, vomiting ?redness, stinging, or swelling at site where injected ?This list may not describe all possible side effects. Call your doctor for medical advice about side effects. You may report side effects to FDA at 1-800-FDA-1088. ?Where should I keep my medication? ?Keep out of the reach of children. ?Store in a refrigerator between 2 and 8 degrees C (36 and 46 degrees F). Do not freeze. Do not shake. Throw away any unused portion if using a single-dose vial. Throw away any unused medicine after the expiration date. ?NOTE: This sheet is a summary. It may not cover all possible information. If you have questions about this medicine, talk to your doctor, pharmacist, or health care provider. ?? 2022 Elsevier/Gold Standard (2017-01-20 00:00:00) ? ?

## 2021-02-09 ENCOUNTER — Telehealth: Payer: Self-pay | Admitting: Oncology

## 2021-02-09 NOTE — Telephone Encounter (Signed)
Rescheduled May appointment due to providers schedule, patient has been called and notified.

## 2021-02-27 ENCOUNTER — Other Ambulatory Visit: Payer: Self-pay

## 2021-02-27 ENCOUNTER — Inpatient Hospital Stay: Payer: Medicare Other | Attending: Oncology

## 2021-02-27 ENCOUNTER — Inpatient Hospital Stay: Payer: Medicare Other

## 2021-02-27 VITALS — BP 132/79 | HR 70 | Temp 98.7°F | Resp 17

## 2021-02-27 DIAGNOSIS — Z79899 Other long term (current) drug therapy: Secondary | ICD-10-CM | POA: Insufficient documentation

## 2021-02-27 DIAGNOSIS — D472 Monoclonal gammopathy: Secondary | ICD-10-CM | POA: Diagnosis present

## 2021-02-27 DIAGNOSIS — D6489 Other specified anemias: Secondary | ICD-10-CM

## 2021-02-27 LAB — CBC WITH DIFFERENTIAL (CANCER CENTER ONLY)
Abs Immature Granulocytes: 0.02 K/uL (ref 0.00–0.07)
Basophils Absolute: 0 K/uL (ref 0.0–0.1)
Basophils Relative: 0 %
Eosinophils Absolute: 0.2 K/uL (ref 0.0–0.5)
Eosinophils Relative: 2 %
HCT: 32.9 % — ABNORMAL LOW (ref 36.0–46.0)
Hemoglobin: 10.2 g/dL — ABNORMAL LOW (ref 12.0–15.0)
Immature Granulocytes: 0 %
Lymphocytes Relative: 31 %
Lymphs Abs: 2.4 K/uL (ref 0.7–4.0)
MCH: 29.1 pg (ref 26.0–34.0)
MCHC: 31 g/dL (ref 30.0–36.0)
MCV: 94 fL (ref 80.0–100.0)
Monocytes Absolute: 0.6 K/uL (ref 0.1–1.0)
Monocytes Relative: 8 %
Neutro Abs: 4.6 K/uL (ref 1.7–7.7)
Neutrophils Relative %: 59 %
Platelet Count: 247 K/uL (ref 150–400)
RBC: 3.5 MIL/uL — ABNORMAL LOW (ref 3.87–5.11)
RDW: 14.3 % (ref 11.5–15.5)
WBC Count: 7.8 K/uL (ref 4.0–10.5)
nRBC: 0 % (ref 0.0–0.2)

## 2021-02-27 MED ORDER — DARBEPOETIN ALFA 300 MCG/0.6ML IJ SOSY
300.0000 ug | PREFILLED_SYRINGE | Freq: Once | INTRAMUSCULAR | Status: AC
  Administered 2021-02-27: 300 ug via SUBCUTANEOUS
  Filled 2021-02-27: qty 0.6

## 2021-02-27 NOTE — Patient Instructions (Signed)
Darbepoetin Alfa injection ?What is this medication? ?DARBEPOETIN ALFA (dar be POE e tin  AL fa) helps your body make more red blood cells. It is used to treat anemia caused by chronic kidney failure and chemotherapy. ?This medicine may be used for other purposes; ask your health care provider or pharmacist if you have questions. ?COMMON BRAND NAME(S): Aranesp ?What should I tell my care team before I take this medication? ?They need to know if you have any of these conditions: ?blood clotting disorders or history of blood clots ?cancer patient not on chemotherapy ?cystic fibrosis ?heart disease, such as angina, heart failure, or a history of a heart attack ?hemoglobin level of 12 g/dL or greater ?high blood pressure ?low levels of folate, iron, or vitamin B12 ?seizures ?an unusual or allergic reaction to darbepoetin, erythropoietin, albumin, hamster proteins, latex, other medicines, foods, dyes, or preservatives ?pregnant or trying to get pregnant ?breast-feeding ?How should I use this medication? ?This medicine is for injection into a vein or under the skin. It is usually given by a health care professional in a hospital or clinic setting. ?If you get this medicine at home, you will be taught how to prepare and give this medicine. Use exactly as directed. Take your medicine at regular intervals. Do not take your medicine more often than directed. ?It is important that you put your used needles and syringes in a special sharps container. Do not put them in a trash can. If you do not have a sharps container, call your pharmacist or healthcare provider to get one. ?A special MedGuide will be given to you by the pharmacist with each prescription and refill. Be sure to read this information carefully each time. ?Talk to your pediatrician regarding the use of this medicine in children. While this medicine may be used in children as young as 1 month of age for selected conditions, precautions do apply. ?Overdosage: If  you think you have taken too much of this medicine contact a poison control center or emergency room at once. ?NOTE: This medicine is only for you. Do not share this medicine with others. ?What if I miss a dose? ?If you miss a dose, take it as soon as you can. If it is almost time for your next dose, take only that dose. Do not take double or extra doses. ?What may interact with this medication? ?Do not take this medicine with any of the following medications: ?epoetin alfa ?This list may not describe all possible interactions. Give your health care provider a list of all the medicines, herbs, non-prescription drugs, or dietary supplements you use. Also tell them if you smoke, drink alcohol, or use illegal drugs. Some items may interact with your medicine. ?What should I watch for while using this medication? ?Your condition will be monitored carefully while you are receiving this medicine. ?You may need blood work done while you are taking this medicine. ?This medicine may cause a decrease in vitamin B6. You should make sure that you get enough vitamin B6 while you are taking this medicine. Discuss the foods you eat and the vitamins you take with your health care professional. ?What side effects may I notice from receiving this medication? ?Side effects that you should report to your doctor or health care professional as soon as possible: ?allergic reactions like skin rash, itching or hives, swelling of the face, lips, or tongue ?breathing problems ?changes in vision ?chest pain ?confusion, trouble speaking or understanding ?feeling faint or lightheaded, falls ?high blood   pressure ?muscle aches or pains ?pain, swelling, warmth in the leg ?rapid weight gain ?severe headaches ?sudden numbness or weakness of the face, arm or leg ?trouble walking, dizziness, loss of balance or coordination ?seizures (convulsions) ?swelling of the ankles, feet, hands ?unusually weak or tired ?Side effects that usually do not require  medical attention (report to your doctor or health care professional if they continue or are bothersome): ?diarrhea ?fever, chills (flu-like symptoms) ?headaches ?nausea, vomiting ?redness, stinging, or swelling at site where injected ?This list may not describe all possible side effects. Call your doctor for medical advice about side effects. You may report side effects to FDA at 1-800-FDA-1088. ?Where should I keep my medication? ?Keep out of the reach of children. ?Store in a refrigerator between 2 and 8 degrees C (36 and 46 degrees F). Do not freeze. Do not shake. Throw away any unused portion if using a single-dose vial. Throw away any unused medicine after the expiration date. ?NOTE: This sheet is a summary. It may not cover all possible information. If you have questions about this medicine, talk to your doctor, pharmacist, or health care provider. ?? 2022 Elsevier/Gold Standard (2017-01-20 00:00:00) ? ?

## 2021-03-05 ENCOUNTER — Other Ambulatory Visit (HOSPITAL_BASED_OUTPATIENT_CLINIC_OR_DEPARTMENT_OTHER): Payer: Self-pay | Admitting: Cardiovascular Disease

## 2021-03-05 DIAGNOSIS — I6522 Occlusion and stenosis of left carotid artery: Secondary | ICD-10-CM

## 2021-03-14 ENCOUNTER — Ambulatory Visit (HOSPITAL_COMMUNITY)
Admission: RE | Admit: 2021-03-14 | Discharge: 2021-03-14 | Disposition: A | Discharge status: Home / Self Care | Payer: Medicare Other | Attending: Gastroenterology | Admitting: Gastroenterology

## 2021-03-14 ENCOUNTER — Encounter (HOSPITAL_COMMUNITY)
Admission: RE | Disposition: A | Discharge status: Home / Self Care | Payer: Self-pay | Source: Home / Self Care | Attending: Gastroenterology

## 2021-03-14 DIAGNOSIS — K5900 Constipation, unspecified: Secondary | ICD-10-CM | POA: Insufficient documentation

## 2021-03-14 DIAGNOSIS — K5902 Outlet dysfunction constipation: Secondary | ICD-10-CM

## 2021-03-14 HISTORY — PX: ANAL RECTAL MANOMETRY: SHX6358

## 2021-03-14 SURGERY — MANOMETRY, ANORECTAL
Anesthesia: Topical

## 2021-03-14 NOTE — Progress Notes (Signed)
Anal rectal manometry performed per protocol without complications.  Patient tolerated well.  Balloon expulsion test performed per protocol without complications.  Patient tolerated well.  Expelled balloon after 30 seconds. 

## 2021-03-15 ENCOUNTER — Encounter (HOSPITAL_COMMUNITY): Payer: Self-pay | Admitting: Gastroenterology

## 2021-03-20 ENCOUNTER — Inpatient Hospital Stay: Payer: Medicare Other | Attending: Oncology

## 2021-03-20 ENCOUNTER — Inpatient Hospital Stay: Payer: Medicare Other

## 2021-03-20 ENCOUNTER — Other Ambulatory Visit: Payer: Self-pay

## 2021-03-20 DIAGNOSIS — D472 Monoclonal gammopathy: Secondary | ICD-10-CM | POA: Diagnosis present

## 2021-03-20 DIAGNOSIS — D6489 Other specified anemias: Secondary | ICD-10-CM

## 2021-03-20 LAB — CBC WITH DIFFERENTIAL (CANCER CENTER ONLY)
Abs Immature Granulocytes: 0.01 10*3/uL (ref 0.00–0.07)
Basophils Absolute: 0 10*3/uL (ref 0.0–0.1)
Basophils Relative: 0 %
Eosinophils Absolute: 0.2 10*3/uL (ref 0.0–0.5)
Eosinophils Relative: 3 %
HCT: 36.5 % (ref 36.0–46.0)
Hemoglobin: 11.4 g/dL — ABNORMAL LOW (ref 12.0–15.0)
Immature Granulocytes: 0 %
Lymphocytes Relative: 27 %
Lymphs Abs: 2.1 10*3/uL (ref 0.7–4.0)
MCH: 29.1 pg (ref 26.0–34.0)
MCHC: 31.2 g/dL (ref 30.0–36.0)
MCV: 93.1 fL (ref 80.0–100.0)
Monocytes Absolute: 0.7 10*3/uL (ref 0.1–1.0)
Monocytes Relative: 9 %
Neutro Abs: 5 10*3/uL (ref 1.7–7.7)
Neutrophils Relative %: 61 %
Platelet Count: 229 10*3/uL (ref 150–400)
RBC: 3.92 MIL/uL (ref 3.87–5.11)
RDW: 13.9 % (ref 11.5–15.5)
WBC Count: 8.1 10*3/uL (ref 4.0–10.5)
nRBC: 0 % (ref 0.0–0.2)

## 2021-03-20 NOTE — Progress Notes (Signed)
Pt came in for Aranesp injection. Hgb is 11.4 therefore the pt does not meet the parameters to receive this injection today. Pt aware, labs and AVS sent w/ patient.  ?

## 2021-03-21 ENCOUNTER — Telehealth: Payer: Self-pay | Admitting: Oncology

## 2021-03-21 NOTE — Telephone Encounter (Signed)
Patient has been called and notified of all upcoming appointments. ?

## 2021-03-27 DIAGNOSIS — K5902 Outlet dysfunction constipation: Secondary | ICD-10-CM

## 2021-04-02 ENCOUNTER — Other Ambulatory Visit: Payer: Self-pay

## 2021-04-02 ENCOUNTER — Telehealth: Payer: Self-pay | Admitting: Gastroenterology

## 2021-04-02 DIAGNOSIS — K5909 Other constipation: Secondary | ICD-10-CM

## 2021-04-02 DIAGNOSIS — K5902 Outlet dysfunction constipation: Secondary | ICD-10-CM

## 2021-04-02 NOTE — Telephone Encounter (Signed)
Addressed in previously created encounter 

## 2021-04-02 NOTE — Telephone Encounter (Signed)
Patient returning your call.

## 2021-04-03 ENCOUNTER — Ambulatory Visit: Payer: Medicare Other | Attending: Gastroenterology | Admitting: Physical Therapy

## 2021-04-03 ENCOUNTER — Other Ambulatory Visit: Payer: Self-pay

## 2021-04-03 DIAGNOSIS — M62838 Other muscle spasm: Secondary | ICD-10-CM | POA: Diagnosis present

## 2021-04-03 DIAGNOSIS — R279 Unspecified lack of coordination: Secondary | ICD-10-CM | POA: Diagnosis present

## 2021-04-03 DIAGNOSIS — K5902 Outlet dysfunction constipation: Secondary | ICD-10-CM | POA: Insufficient documentation

## 2021-04-03 NOTE — Therapy (Signed)
?OUTPATIENT PHYSICAL THERAPY FEMALE PELVIC EVALUATION ? ? ?Patient Name: Jo [name] ?MRN: [id number] ?DOB:[birth year], 76 y.o., female ?Today's Date: 04/04/2021 ? ? PT End of Session - 04/04/21 1915   ? ? Visit Number 1   ? Date for PT Re-Evaluation 06/26/21   ? Authorization Type [employer] Medicare   ? PT Start Time 9371   ? PT Stop Time 1529   ? PT Time Calculation (min) 40 min   ? Activity Tolerance Patient tolerated treatment well   ? Behavior During Therapy [facility] for tasks assessed/performed   ? ?  ?  ? ?  ? ? ?Past Medical History:  ?Diagnosis Date  ? Allergy   ? Anemia   ? Arthritis   ? right knee  ? Carotid bruit 10/11/2019  ? Carotid stenosis 10/11/2019  ? Cataract   ? forming both  ? Chronic kidney disease   ? GERD (gastroesophageal reflux disease)   ? past hx  ? Goiter   ? Hyperlipidemia   ? Hypertension   ? Lower extremity edema 10/11/2019  ? Menopause age 62  ? Pure hypercholesterolemia 10/11/2019  ? Reflux   ? Shortness of breath 12/21/2020  ? Spinal stenosis   ? ?Past Surgical History:  ?Procedure Laterality Date  ? ANAL RECTAL MANOMETRY N/A 03/14/2021  ? Procedure: ANO RECTAL MANOMETRY;  Surgeon: Thornton Park, MD;  Location: [facility];  Service: Gastroenterology;  Laterality: N/A;  ? CERVICAL SPINE SURGERY  06/15/2010  ? COLONOSCOPY  2005  ? normal  ? LEFT HEART CATH AND CORONARY ANGIOGRAPHY N/A 09/16/2017  ? Procedure: LEFT HEART CATH AND CORONARY ANGIOGRAPHY;  Surgeon: Dixie Dials, MD;  Location: [facility];  Service: Cardiovascular;  Laterality: N/A;  ? ?Patient Active Problem List  ? Diagnosis Date Noted  ? 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 Other specified anemias 01/30/2021  ? Shortness of breath 12/21/2020  ? 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 Unilateral primary osteoarthritis, right knee 05/24/2020  ? Flank pain 05/22/2020  ? Colon cancer screening 05/22/2020  ? Stage 3a chronic kidney disease ([place]) 10/26/2019  ? Pure hypercholesterolemia  10/11/2019  ? Carotid stenosis 10/11/2019  ? Impaired fasting glucose 12/18/2016  ? Pre-diabetes 10/25/2014  ? Essential hypertension   ? Reflux   ? Mild anemia   ? ? ?PCP: Horald Pollen, MD ? ?REFERRING PROVIDER: Thornton Park, MD ? ?REFERRING DIAG: K59.02 (ICD-10-CM) - Constipation due to outlet dysfunction ? ?THERAPY DIAG:  ?Other muscle spasm ? ?Unspecified lack of coordination ? ?ONSET DATE: years ? ?SUBJECTIVE:                                                                                                                                                                                          ? ?  SUBJECTIVE STATEMENT: ?I have never been very regular and have worked when I was never able to go when needed.  Now when I try sometimes I can't go.  A good week would be 3x without having to take a laxative.   ?Fluid intake: 100 oz ? ?Patient confirms identification and approves PT to assess pelvic floor and treatment No ? ? ?PAIN:  ?Are you having pain? Yes ?NPRS scale: 5/10 ?Pain location: abdomen ? ?Pain type: gas ?Pain description: aching  ? ?Aggravating factors: getting bloated ?Relieving factors: have BM ? ?PRECAUTIONS: None ? ?WEIGHT BEARING RESTRICTIONS No ? ?FALLS:  ?Has patient fallen in last 6 months? No, Number of falls: 0 ? ?LIVING ENVIRONMENT: ?Lives with: lives with their family and lives with their daughter ?Lives in: House/apartment ? ? ?OCCUPATION: retired ? ?PLOF: Independent ? ?PATIENT GOALS have more regular BM ? ?PERTINENT HISTORY:  ? ?Sexual abuse: No ? ?BOWEL MOVEMENT ?Pain with bowel movement: No ?Type of bowel movement:Strain Yes and Splinting squeezing around the anus , small lumps ?Fully empty rectum: No ?Leakage: No ?Pads: No ?Fiber supplement: No - senna, mirilax, probiotics ? ?URINATION ?Pain with urination: No ?Fully empty bladder: Yes:   ?Stream:  sometimes ?Urgency: No ?Frequency: normal ?Leakage:  no ?Pads: No ? ?INTERCOURSE ? ? ?PREGNANCY ?Vaginal deliveries 2 ?Tearing  No ? ? ?PROLAPSE ?None ? ? ? ?OBJECTIVE:  ? ? ? ?COGNITION: ? Overall cognitive status: Within functional limits for tasks assessed   ?  ? ?MUSCLE LENGTH: ?Hamstrings: Right 709 deg; Left 70 deg ? ? ? ?GAIT: ? ? ?POSTURE:  ?increased thoracic kyphosis ? ?LUMBARAROM/PROM ? ?A/PROM A/PROM  ?04/04/2021  ?Flexion   ?Extension   ?Right lateral flexion   ?Left lateral flexion   ?Right rotation   ?Left rotation   ? (Blank rows = not tested) ? ?LE ROM: ?Hip grossly 25% limited ? ? ?LE MMT: ? 4/5 hip strength ? ?PELVIC MMT: ?  ?MMT  ?04/04/2021  ?Vaginal   ?Internal Anal Sphincter   ?External Anal Sphincter   ?Puborectalis   ?Diastasis Recti   ?(Blank rows = not tested) ? ?      PALPATION: ?  General  external palpation only due to pt consent; holding breath and difficulty bulging ? ? ?TONE: ?high ? ?TODAY'S TREATMENT  ?EVAL education on pelvic floor therapy ? ? ?PATIENT EDUCATION:  ?Education details: toileting handout ?Person educated: Patient ?Education method: Explanation ?Education comprehension: verbalized understanding ? ? ?HOME EXERCISE PROGRAM: ?None at this time ? ?ASSESSMENT: ? ?CLINICAL IMPRESSION: ?Patient is a 76 y.o. female who was seen today for physical therapy evaluation and treatment for constipation. Pt has muscle tension and decreased coordination.  Eval shortened due to needing a lot of education and explanation about pelvic PT.  Pt will benefit from skilled PT to address all above mentioned impairments ? ? ? ?OBJECTIVE IMPAIRMENTS decreased activity tolerance, decreased coordination, decreased endurance, decreased knowledge of condition, decreased ROM, decreased strength, increased muscle spasms, impaired flexibility, and impaired tone.  ? ?ACTIVITY LIMITATIONS  toileting .  ? ?PERSONAL FACTORS Time since onset of injury/illness/exacerbation are also affecting patient's functional outcome.  ? ? ?REHAB POTENTIAL: Good ? ?CLINICAL DECISION MAKING: Evolving/moderate complexity ? ?EVALUATION COMPLEXITY:  Low ? ? ?GOALS: ?Goals reviewed with patient? Yes ? ?SHORT TERM GOALS: Target date: 04/30/21 ? ?Pt will be ind with initial HEP ?Baseline:  ?Goal status: INITIAL ? ? ?LONG TERM GOALS: Target date: 06/26/21 ? ?Pt will be independent with advanced HEP to maintain  improvements made throughout therapy ? ?Baseline:  ?Goal status: INITIAL ? ?2.  Pt will report 3 BMs per week due to improved muscle tone and coordination with BMs.  ?Baseline:  ?Goal status: INITIAL ? ?3.  Pt will report her BMs are complete due to improved bowel habits and evacuation techniques.  ?Baseline:  ?Goal status: INITIAL ? ? ? ?PLAN: ?PT FREQUENCY: 1x/week ? ?PT DURATION: 12 weeks ? ?PLANNED INTERVENTIONS: Therapeutic exercises, Therapeutic activity, Neuromuscular re-education, Balance training, Gait training, Patient/Family education, Joint mobilization, Dry Needling, Electrical stimulation, Cryotherapy, Moist heat, Taping, Biofeedback, and Manual therapy ? ?PLAN FOR NEXT SESSION: breathing and bulging, lumbar and hip stretches ? ? ?Jule Ser, PT ?04/04/2021, 7:24 PM ? ?

## 2021-04-04 ENCOUNTER — Encounter: Payer: Self-pay | Admitting: Physical Therapy

## 2021-04-06 ENCOUNTER — Telehealth: Payer: Self-pay | Admitting: Emergency Medicine

## 2021-04-06 NOTE — Telephone Encounter (Signed)
LVM for pt to rtn my call to schedule AWV with [facility]. Please schedule this appt if pt calls the office.  °

## 2021-04-10 ENCOUNTER — Inpatient Hospital Stay: Payer: Medicare Other

## 2021-04-10 ENCOUNTER — Other Ambulatory Visit: Payer: Self-pay

## 2021-04-10 DIAGNOSIS — D6489 Other specified anemias: Secondary | ICD-10-CM

## 2021-04-10 DIAGNOSIS — D472 Monoclonal gammopathy: Secondary | ICD-10-CM | POA: Diagnosis not present

## 2021-04-10 LAB — CBC WITH DIFFERENTIAL (CANCER CENTER ONLY)
Abs Immature Granulocytes: 0.02 10*3/uL (ref 0.00–0.07)
Basophils Absolute: 0 10*3/uL (ref 0.0–0.1)
Basophils Relative: 0 %
Eosinophils Absolute: 0.3 10*3/uL (ref 0.0–0.5)
Eosinophils Relative: 3 %
HCT: 36.9 % (ref 36.0–46.0)
Hemoglobin: 11.6 g/dL — ABNORMAL LOW (ref 12.0–15.0)
Immature Granulocytes: 0 %
Lymphocytes Relative: 30 %
Lymphs Abs: 2.7 10*3/uL (ref 0.7–4.0)
MCH: 28.8 pg (ref 26.0–34.0)
MCHC: 31.4 g/dL (ref 30.0–36.0)
MCV: 91.6 fL (ref 80.0–100.0)
Monocytes Absolute: 0.7 10*3/uL (ref 0.1–1.0)
Monocytes Relative: 8 %
Neutro Abs: 5.2 10*3/uL (ref 1.7–7.7)
Neutrophils Relative %: 59 %
Platelet Count: 231 10*3/uL (ref 150–400)
RBC: 4.03 MIL/uL (ref 3.87–5.11)
RDW: 13.3 % (ref 11.5–15.5)
WBC Count: 8.9 10*3/uL (ref 4.0–10.5)
nRBC: 0 % (ref 0.0–0.2)

## 2021-04-10 NOTE — Progress Notes (Signed)
Patient came in for Aranesp injection today. Hemoglobin 11.6, which does not meet the parameters for the injection today.  Gave patient lab work print out.  ?

## 2021-04-18 ENCOUNTER — Ambulatory Visit (INDEPENDENT_AMBULATORY_CARE_PROVIDER_SITE_OTHER): Payer: Medicare Other | Admitting: Cardiovascular Disease

## 2021-04-18 ENCOUNTER — Encounter (HOSPITAL_BASED_OUTPATIENT_CLINIC_OR_DEPARTMENT_OTHER): Payer: Self-pay | Admitting: Cardiovascular Disease

## 2021-04-18 VITALS — BP 152/84 | HR 65 | Ht 66.0 in | Wt 165.2 lb

## 2021-04-18 DIAGNOSIS — I10 Essential (primary) hypertension: Secondary | ICD-10-CM

## 2021-04-18 DIAGNOSIS — I6521 Occlusion and stenosis of right carotid artery: Secondary | ICD-10-CM | POA: Diagnosis not present

## 2021-04-18 NOTE — Progress Notes (Signed)
? ?Cardiology Office Note:  ? ?Date:  04/18/2021  ? ?ID:  Jo [name], DOB 03/23/1945, MRN [id number] ? ?PCP:  Horald Pollen, MD  ?Cardiologist:  Skeet Latch, MD  ?Nephrologist: Dr. Joelyn Oms, [facility] ? ?Referring MD: Horald Pollen, *  ? ?CC: Hypertension, Follow-up ? ?History of Present Illness:   ? ?Jo [name] is a 76 y.o. female with a hx of hypertension, hyperlipidemia, and pre-diabetes here for follow-up.  She was initially seen 09/2019 to establish care in the hypertension clinic.  She previously saw Dr. Doylene Canard 09/2017.  At the time she had chest pain.  She had an echo at that time that revealed LVEF 65 to 70% with mild LVH and grade 1 diastolic dysfunction.  Mild calcification was noted of the aortic valve but there was no aortic stenosis.  She also has significant mitral annular calcification.  She had a left heart catheterization that showed no coronary artery disease. [name] was diagnosed with hypertension about 20 years ago.  She was switched from lisinopril to hydrochlorothiazide.  Since that time her primary care doctor also started her on rosuvastatin.  She had carotid Dopplers 10/2019 that revealed mild ICA stenosis bilaterally.   ? ?Ather last appointment, she had myalgias and rosuvastatin was held. She had shortness of breath and BNP was 52. Repeat carotid doppler showed mild blockage on the right and moderate on the left. She had followed up with oncology and they do not think she has multiple myelomas. 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 She follows up with nephrology. ? ?Today, she reports she is doing well. ?She is staying active and exercising by walking everyday for about an hour. Denies chest pain or dyspnea while walking. She is doing well with repatha. Notes an episode of soreness but only lasted for about 3 days. ?She notes her blood pressure has been high at her last doctor visits. They have  been fluctuating at all her visits but has not been doing anything different . She was told to stop taking HCTZ by nephrology. She has been watching her sodium intake.  ? ?She denies chest pain, shortness of breath, palpitations, lightheadedness, headaches, syncope, LE edema, orthopnea, PND.  ? ?Previous antihypertensives: ?Lisinopril ? ?Past Medical History:  ?Diagnosis Date  ? Allergy   ? Anemia   ? Arthritis   ? right knee  ? Carotid bruit 10/11/2019  ? Carotid stenosis 10/11/2019  ? Cataract   ? forming both  ? Chronic kidney disease   ? GERD (gastroesophageal reflux disease)   ? past hx  ? Goiter   ? Hyperlipidemia   ? Hypertension   ? Lower extremity edema 10/11/2019  ? Menopause age 16  ? Pure hypercholesterolemia 10/11/2019  ? Reflux   ? Shortness of breath 12/21/2020  ? Spinal stenosis   ? ? ?Past Surgical History:  ?Procedure Laterality Date  ? ANAL RECTAL MANOMETRY N/A 03/14/2021  ? Procedure: ANO RECTAL MANOMETRY;  Surgeon: Thornton Park, MD;  Location: [facility];  Service: Gastroenterology;  Laterality: N/A;  ? CERVICAL SPINE SURGERY  06/15/2010  ? COLONOSCOPY  2005  ? normal  ? LEFT HEART CATH AND CORONARY ANGIOGRAPHY N/A 09/16/2017  ? Procedure: LEFT HEART CATH AND CORONARY ANGIOGRAPHY;  Surgeon: Dixie Dials, MD;  Location: [facility];  Service: Cardiovascular;  Laterality: N/A;  ? ? ?Current Medications: ?Current Meds  ?Medication Sig  ? acetaminophen (TYLENOL) 325 MG tablet  Take 325 mg by mouth every 6 (six) hours as needed for moderate pain.  ? aspirin EC 81 MG tablet Take 81 mg by mouth at bedtime.  ? Cholecalciferol (DIALYVITE VITAMIN D 5000) 125 MCG (5000 UT) capsule Take 5,000 Units by mouth at bedtime.  ? Evolocumab (REPATHA SURECLICK) 229 MG/ML SOAJ Inject 140 mg into the skin every 14 (fourteen) days.  ? FARXIGA 10 MG TABS tablet Take 10 mg by mouth daily.  ? Ferrous Sulfate (IRON SLOW RELEASE PO) Take 1 tablet by mouth daily.  ? lisinopril (ZESTRIL) 10 MG tablet Take 10 mg by  mouth daily.  ? ondansetron (ZOFRAN) 4 MG tablet Take 1 tablet (4 mg total) by mouth every 8 (eight) hours as needed for nausea or vomiting.  ? polyethylene glycol (MIRALAX / GLYCOLAX) packet Take 17 g by mouth daily as needed (constipation). Mix in 8 oz liquid and drink  ? Probiotic Product (DIGESTIVE ADVANTAGE) CAPS Take 1 capsule by mouth daily.  ? vitamin B-12 (CYANOCOBALAMIN) 1000 MCG tablet Take 1,000 mcg by mouth at bedtime.   ?  ? ?Allergies:   Demerol, Crestor [rosuvastatin], and Lipitor [atorvastatin]  ? ?Social History  ? ?Socioeconomic History  ? Marital status: Widowed  ?  Spouse name: Not on file  ? Number of children: 3  ? Years of education: college  ? Highest education level: Not on file  ?Occupational History  ? Occupation: CSST  ?  Employer: [employer]  ?Tobacco Use  ? Smoking status: Never  ? Smokeless tobacco: Never  ?Substance and Sexual Activity  ? Alcohol use: No  ?  Comment: glass of wine-special occasions  ? Drug use: No  ? Sexual activity: Never  ?  Birth control/protection: Abstinence  ?Other Topics Concern  ? Not on file  ?Social History Narrative  ? Not on file  ? ?Social Determinants of Health  ? ?Financial Resource Strain: Not on file  ?Food Insecurity: Not on file  ?Transportation Needs: Not on file  ?Physical Activity: Not on file  ?Stress: Not on file  ?Social Connections: Not on file  ?  ? ?Family History: ?The patient's family history includes Breast cancer in her sister; CAD in her sister and sister; Cancer in her brother, sister, sister, and another family member; Heart attack in her father; Heart disease in her brother, father, and mother; Hypertension in her daughter, mother, and another family member; Lung cancer in her brother and sister; Stroke in her mother. There is no history of Colon cancer, Colon polyps, Esophageal cancer, Rectal cancer, or Stomach cancer. ? ?ROS:   ?Please see the history of present illness.  ?All other systems reviewed and are  negative. ? ?EKGs/Labs/Other Studies Reviewed:   ? ?Carotid Duplex Bilateral 01/17/2021: ? ?Right Carotid: Velocities in the right ICA are consistent with a stable 1-39% stenosis.  ? ?Left Carotid: Velocities in the left ICA are now consistent with a 40-59% stenosis. ICA velocites have increased since prior exam.  ? ?Vertebrals:  Bilateral vertebral arteries demonstrate antegrade flow.  ? ?Subclavians: Normal flow hemodynamics were seen in bilateral subclavian arteries.  ? ?Carotid Doppler 10/22/19: ?1-39% ICA stenosis bilaterally. ? ?[place] 09/2017:  ?Medical treatment. ?  ?No indication for antiplatelet therapy at this time. ? ?No CAD ? ?Echo 09/2017: ?Study Conclusions  ?- Left ventricle: The cavity size was normal. There was mild  ?  concentric hypertrophy. Systolic function was vigorous. The  ?  estimated ejection fraction was in the range of 65% to 70%.  Wall  ?  motion was normal; there were no regional wall motion  ?  abnormalities. Doppler parameters are consistent with abnormal  ?  left ventricular relaxation (grade 1 diastolic dysfunction).  ?- Aortic valve: Mild focal calcification involving the right  ?  coronary and noncoronary cusp.  ?- Mitral valve: Moderate focal calcification of the posterior  ?  leaflet. Minimal focal calcification of the anterior leaflet.  ?  Mobility of the posterior leaflet was mildly restricted. The  ?  findings are consistent with trivial stenosis. There was mild  ?  regurgitation.  ?- Left atrium: The atrium was mildly dilated.  ? ?EKG:   ?04/18/2021: sinus rhythm rate-65 bpm borderline LVH ?12/21/2020: EKG was not ordered. ?06/08/2020: SR, rate 67 bpm ?12/15/2019: EKG is not ordered today.  ? ?Recent Labs: ?12/21/2020: BNP 52.1; TSH 1.280 ?01/23/2021: ALT 7; BUN 27; Creatinine 1.44; Potassium 4.0; Sodium 138 ?04/10/2021: Hemoglobin 11.6; Platelet Count 231  ? ?09/01/2019: ?Sodium 140, potassium 4.7, BUN 24, creatinine 1.2 ?AST 20, ALT 13 ?WBC 8.3, hemoglobin 10.8, hematocrit 35.2,  platelets 239 ? ?Recent Lipid Panel ?   ?Component Value Date/Time  ? CHOL 212 (H) 01/22/2021 1138  ? TRIG 93 01/22/2021 1138  ? HDL 57 01/22/2021 1138  ? CHOLHDL 3.7 01/22/2021 1138  ? CHOLHDL 3 10/18/2020 1535  ? VLDL 15.0

## 2021-04-18 NOTE — Assessment & Plan Note (Signed)
Carotid Dopplers 01/2021 showed mild stenosis on the right and moderate on the left.  She previously had mild stenosis bilaterally.  She did not tolerate statins and has not been started on Repatha.  She will come back for fasting lipids and a CMP.  Continue aspirin and Repatha. ?

## 2021-04-18 NOTE — Assessment & Plan Note (Signed)
Blood pressure was slightly elevated in the office but she notes that it has been generally well controlled.  She is going to track her blood pressures twice daily and call and let us know the results.  Her HCTZ was discontinued due to renal dysfunction.   ?

## 2021-04-18 NOTE — Patient Instructions (Addendum)
Medication Instructions:  ?Your physician recommends that you continue on your current medications as directed. Please refer to the Current Medication list given to you today.  ?*If you need a refill on your cardiac medications before your next appointment, please call your pharmacy* ? ?Lab Work: ?FASTING LP/CMET SOON  ? ?If you have labs (blood work) drawn today and your tests are completely normal, you will receive your results only by: ?MyChart Message (if you have MyChart) OR ?A paper copy in the mail ?If you have any lab test that is abnormal or we need to change your treatment, we will call you to review the results. ? ?Testing/Procedures: ?Your physician has requested that you have a carotid duplex. This test is an ultrasound of the carotid arteries in your neck. It looks at blood flow through these arteries that supply the brain with blood. Allow one hour for this exam. There are no restrictions or special instructions. ?IN January 2024  ? ?Follow-Up: ?At [facility], you and your health needs are our priority.  As part of our continuing mission to provide you with exceptional heart care, we have created designated Provider Care Teams.  These Care Teams include your primary Cardiologist (physician) and Advanced Practice Providers (APPs -  Physician Assistants and Nurse Practitioners) who all work together to provide you with the care you need, when you need it. ? ?We recommend signing up for the patient portal called "MyChart".  Sign up information is provided on this After Visit Summary.  MyChart is used to connect with patients for Virtual Visits (Telemedicine).  Patients are able to view lab/test results, encounter notes, upcoming appointments, etc.  Non-urgent messages can be sent to your provider as well.   ?To learn more about what you can do with MyChart, go to [email or web address].   ? ?Your next appointment:   ?9 month(s) ? ?The format for your next appointment:   ?In Person ? ?Provider:    ?Skeet Latch, MD{ ? ?Other Instructions ?MONITOR BLOOD PRESSURE TWICE A DAY FOR COUPLE OF WEEKS, CALL DR Herman WITH READINGS  ?

## 2021-04-24 ENCOUNTER — Ambulatory Visit: Payer: Medicare Other

## 2021-04-25 ENCOUNTER — Ambulatory Visit: Payer: Medicare Other | Attending: Gastroenterology | Admitting: Physical Therapy

## 2021-04-25 ENCOUNTER — Encounter: Payer: Self-pay | Admitting: Physical Therapy

## 2021-04-25 DIAGNOSIS — R279 Unspecified lack of coordination: Secondary | ICD-10-CM | POA: Insufficient documentation

## 2021-04-25 DIAGNOSIS — M62838 Other muscle spasm: Secondary | ICD-10-CM | POA: Insufficient documentation

## 2021-04-25 NOTE — Therapy (Signed)
?OUTPATIENT PHYSICAL THERAPY TREATMENT NOTE ? ? ?Patient Name: Jo [name] ?MRN: [id number] ?DOB:[birth year], 76 y.o., female ?Today's Date: 04/25/2021 ? ?PCP: Horald Pollen, MD ?REFERRING PROVIDER: Thornton Park, MD ? ?END OF SESSION:  ? PT End of Session - 04/25/21 1405   ? ? Visit Number 2   ? Date for PT Re-Evaluation 06/26/21   ? Authorization Type [employer] Medicare   ? PT Start Time 2831   ? PT Stop Time 5176   ? PT Time Calculation (min) 40 min   ? Activity Tolerance Patient tolerated treatment well   ? Behavior During Therapy [facility] for tasks assessed/performed   ? ?  ?  ? ?  ? ? ?Past Medical History:  ?Diagnosis Date  ? Allergy   ? Anemia   ? Arthritis   ? right knee  ? Carotid bruit 10/11/2019  ? Carotid stenosis 10/11/2019  ? Cataract   ? forming both  ? Chronic kidney disease   ? GERD (gastroesophageal reflux disease)   ? past hx  ? Goiter   ? Hyperlipidemia   ? Hypertension   ? Lower extremity edema 10/11/2019  ? Menopause age 2  ? Pure hypercholesterolemia 10/11/2019  ? Reflux   ? Shortness of breath 12/21/2020  ? Spinal stenosis   ? ?Past Surgical History:  ?Procedure Laterality Date  ? ANAL RECTAL MANOMETRY N/A 03/14/2021  ? Procedure: ANO RECTAL MANOMETRY;  Surgeon: Thornton Park, MD;  Location: [facility];  Service: Gastroenterology;  Laterality: N/A;  ? CERVICAL SPINE SURGERY  06/15/2010  ? COLONOSCOPY  2005  ? normal  ? LEFT HEART CATH AND CORONARY ANGIOGRAPHY N/A 09/16/2017  ? Procedure: LEFT HEART CATH AND CORONARY ANGIOGRAPHY;  Surgeon: Dixie Dials, MD;  Location: [facility];  Service: Cardiovascular;  Laterality: N/A;  ? ?Patient Active Problem List  ? Diagnosis Date Noted  ? 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 Other specified anemias 01/30/2021  ? Shortness of breath 12/21/2020  ? 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 Unilateral primary osteoarthritis, right knee 05/24/2020  ? Flank pain 05/22/2020  ? Colon cancer screening  05/22/2020  ? Stage 3a chronic kidney disease ([place]) 10/26/2019  ? Pure hypercholesterolemia 10/11/2019  ? Carotid stenosis 10/11/2019  ? Impaired fasting glucose 12/18/2016  ? Pre-diabetes 10/25/2014  ? Essential hypertension   ? Reflux   ? Mild anemia   ? ? ?REFERRING DIAG: K59.02 (ICD-10-CM) - Constipation due to outlet dysfunction ? ?THERAPY DIAG:  ?Other muscle spasm ? ?Unspecified lack of coordination ? ?PERTINENT HISTORY: No ? ?PRECAUTIONS: None ? ?SUBJECTIVE: I maybe have a little less trouble when doing the things you showed me last time ? ?PAIN:  ?Are you having pain? No and Yes: NPRS scale: 6/10 ?Pain location: back ?Pain description: sore ?Aggravating factors: walking slow ?Relieving factors: rest ? ? ? ? ?OBJECTIVE:  ?  ?  ?  ?COGNITION: ?           Overall cognitive status: Within functional limits for tasks assessed              ?            ?  ?MUSCLE LENGTH: ?Hamstrings: Right 709 deg; Left 70 deg ?  ?  ?  ?GAIT: ?  ?  ?POSTURE:  ?increased thoracic kyphosis ?  ?LUMBARAROM/PROM ?  ?A/PROM A/PROM  ?04/04/2021  ?Flexion    ?Extension limited   ?Right lateral flexion    ?Left lateral  flexion    ?Right rotation    ?Left rotation    ? (Blank rows = not tested) ?  ?LE ROM: ?Hip grossly 25% limited ?  ?  ?LE MMT: ?            4/5 hip strength ?  ?PELVIC MMT: ?  ?MMT   ?04/04/2021  ?Vaginal    ?Internal Anal Sphincter    ?External Anal Sphincter    ?Puborectalis    ?Diastasis Recti    ?(Blank rows = not tested) ?  ?      PALPATION: ?  General  external palpation only due to pt consent; holding breath and difficulty bulging ?  ?  ?TONE: ?high ?  ?TODAY'S TREATMENT  ?Treatment:04/25/21 ?Exercises  ?Breathing with stretches: butterfly, hip rotation, hamstring, piriformis ?Manual ?Lumbar and abdominal STM and MFR ? ?Nuero Re-ed ?Education and cues for coordination of breathing and pelvic floor muscle contracting and relaxing at appropriate times ? ? ?  ?PATIENT EDUCATION:  ?Education details: Access Code:  [email or web address] ?Person educated: Patient ?Education method: Explanation ?Education comprehension: verbalized understanding ?  ?  ?HOME EXERCISE PROGRAM: ?Access Code: [email or web address] ?URL: [email or web address] ?Date: 04/25/2021 ?Prepared by: Jari Favre ? ?Exercises ?- Supine Hamstring Stretch with Strap  - 1 x daily - 7 x weekly - 1 sets - 3 reps - 30 sec hold ?- Supine Piriformis Stretch with Foot on Ground  - 1 x daily - 7 x weekly - 1 sets - 3 reps - 30 sec hold ?- Supine Figure 4 Piriformis Stretch  - 1 x daily - 7 x weekly - 1 sets - 3 reps - 30 sec hold ?- Sidelying Thoracic Lumbar Rotation  - 1 x daily - 7 x weekly - 1 sets - 5 reps - 10 sec hold ?- Static Prone on Elbows  - 1 x daily - 7 x weekly - 1 sets - 5 reps - 5 hold ?  ?ASSESSMENT: ?  ?CLINICAL IMPRESSION: ?Patient is here for initial follow up and reports some improvement.  Pt has stiff lumbar spine and paraspinals.  Some improvement with STM followed by press ups.  Pt did well with stretches and gave HEP to continue to work on improved flexibility and further bulging of the pelvic floor.  Pt will benefit from skilled PT to continue to work on improved BM ?  ?  ?  ?OBJECTIVE IMPAIRMENTS decreased activity tolerance, decreased coordination, decreased endurance, decreased knowledge of condition, decreased ROM, decreased strength, increased muscle spasms, impaired flexibility, and impaired tone.  ?  ?ACTIVITY LIMITATIONS  toileting .  ?  ?PERSONAL FACTORS Time since onset of injury/illness/exacerbation are also affecting patient's functional outcome.  ?  ?  ?REHAB POTENTIAL: Good ?  ?CLINICAL DECISION MAKING: Evolving/moderate complexity ?  ?EVALUATION COMPLEXITY: Low ?  ?  ?GOALS: ?Goals reviewed with patient? Yes ?  ?SHORT TERM GOALS: Target date: 04/30/21 ?  ?Pt will be ind with initial HEP ?Baseline:  ?Goal status: INITIAL ?  ?  ?LONG TERM GOALS: Target date: 06/26/21 ?  ?Pt will be independent with advanced HEP to maintain improvements  made throughout therapy ?  ?Baseline:  ?Goal status: INITIAL ?  ?2.  Pt will report 3 BMs per week due to improved muscle tone and coordination with BMs.  ?Baseline:  ?Goal status: INITIAL ?  ?3.  Pt will report her BMs are complete due to improved bowel habits and evacuation techniques.  ?Baseline:  ?Goal status: INITIAL ?  ?  ?  ?  PLAN: ?PT FREQUENCY: 1x/week ?  ?PT DURATION: 12 weeks ?  ?PLANNED INTERVENTIONS: Therapeutic exercises, Therapeutic activity, Neuromuscular re-education, Balance training, Gait training, Patient/Family education, Joint mobilization, Dry Needling, Electrical stimulation, Cryotherapy, Moist heat, Taping, Biofeedback, and Manual therapy ?  ?PLAN FOR NEXT SESSION: f/u on HEP, maybe dry needling if open to it to lumbar multifidi, sitting on ball, butterfly with pelvis elevated on block or wedge  ? ? ?Jule Ser, PT ?04/25/2021, 2:06 PM ? ?  ? ?

## 2021-04-30 ENCOUNTER — Encounter: Payer: Medicare Other | Admitting: Emergency Medicine

## 2021-05-01 ENCOUNTER — Other Ambulatory Visit: Payer: Self-pay

## 2021-05-01 ENCOUNTER — Inpatient Hospital Stay: Payer: Medicare Other | Attending: Oncology

## 2021-05-01 ENCOUNTER — Inpatient Hospital Stay: Payer: Medicare Other

## 2021-05-01 VITALS — BP 144/78 | HR 64 | Temp 98.4°F | Resp 16

## 2021-05-01 DIAGNOSIS — D472 Monoclonal gammopathy: Secondary | ICD-10-CM | POA: Diagnosis present

## 2021-05-01 DIAGNOSIS — D6489 Other specified anemias: Secondary | ICD-10-CM

## 2021-05-01 DIAGNOSIS — N186 End stage renal disease: Secondary | ICD-10-CM | POA: Diagnosis present

## 2021-05-01 DIAGNOSIS — D631 Anemia in chronic kidney disease: Secondary | ICD-10-CM | POA: Diagnosis present

## 2021-05-01 LAB — CBC WITH DIFFERENTIAL (CANCER CENTER ONLY)
Abs Immature Granulocytes: 0.01 10*3/uL (ref 0.00–0.07)
Basophils Absolute: 0 10*3/uL (ref 0.0–0.1)
Basophils Relative: 0 %
Eosinophils Absolute: 0.2 10*3/uL (ref 0.0–0.5)
Eosinophils Relative: 2 %
HCT: 34.4 % — ABNORMAL LOW (ref 36.0–46.0)
Hemoglobin: 10.9 g/dL — ABNORMAL LOW (ref 12.0–15.0)
Immature Granulocytes: 0 %
Lymphocytes Relative: 30 %
Lymphs Abs: 2.3 10*3/uL (ref 0.7–4.0)
MCH: 28.2 pg (ref 26.0–34.0)
MCHC: 31.7 g/dL (ref 30.0–36.0)
MCV: 88.9 fL (ref 80.0–100.0)
Monocytes Absolute: 0.7 10*3/uL (ref 0.1–1.0)
Monocytes Relative: 9 %
Neutro Abs: 4.6 10*3/uL (ref 1.7–7.7)
Neutrophils Relative %: 59 %
Platelet Count: 198 10*3/uL (ref 150–400)
RBC: 3.87 MIL/uL (ref 3.87–5.11)
RDW: 13.5 % (ref 11.5–15.5)
WBC Count: 7.8 10*3/uL (ref 4.0–10.5)
nRBC: 0 % (ref 0.0–0.2)

## 2021-05-01 MED ORDER — DARBEPOETIN ALFA 300 MCG/0.6ML IJ SOSY
300.0000 ug | PREFILLED_SYRINGE | Freq: Once | INTRAMUSCULAR | Status: AC
  Administered 2021-05-01: 300 ug via SUBCUTANEOUS
  Filled 2021-05-01: qty 0.6

## 2021-05-01 NOTE — Patient Instructions (Signed)
Darbepoetin Alfa injection ?What is this medication? ?DARBEPOETIN ALFA (dar be POE e tin  AL fa) helps your body make more red blood cells. It is used to treat anemia caused by chronic kidney failure and chemotherapy. ?This medicine may be used for other purposes; ask your health care provider or pharmacist if you have questions. ?COMMON BRAND NAME(S): Aranesp ?What should I tell my care team before I take this medication? ?They need to know if you have any of these conditions: ?blood clotting disorders or history of blood clots ?cancer patient not on chemotherapy ?cystic fibrosis ?heart disease, such as angina, heart failure, or a history of a heart attack ?hemoglobin level of 12 g/dL or greater ?high blood pressure ?low levels of folate, iron, or vitamin B12 ?seizures ?an unusual or allergic reaction to darbepoetin, erythropoietin, albumin, hamster proteins, latex, other medicines, foods, dyes, or preservatives ?pregnant or trying to get pregnant ?breast-feeding ?How should I use this medication? ?This medicine is for injection into a vein or under the skin. It is usually given by a health care professional in a hospital or clinic setting. ?If you get this medicine at home, you will be taught how to prepare and give this medicine. Use exactly as directed. Take your medicine at regular intervals. Do not take your medicine more often than directed. ?It is important that you put your used needles and syringes in a special sharps container. Do not put them in a trash can. If you do not have a sharps container, call your pharmacist or healthcare provider to get one. ?A special MedGuide will be given to you by the pharmacist with each prescription and refill. Be sure to read this information carefully each time. ?Talk to your pediatrician regarding the use of this medicine in children. While this medicine may be used in children as young as 1 month of age for selected conditions, precautions do apply. ?Overdosage: If  you think you have taken too much of this medicine contact a poison control center or emergency room at once. ?NOTE: This medicine is only for you. Do not share this medicine with others. ?What if I miss a dose? ?If you miss a dose, take it as soon as you can. If it is almost time for your next dose, take only that dose. Do not take double or extra doses. ?What may interact with this medication? ?Do not take this medicine with any of the following medications: ?epoetin alfa ?This list may not describe all possible interactions. Give your health care provider a list of all the medicines, herbs, non-prescription drugs, or dietary supplements you use. Also tell them if you smoke, drink alcohol, or use illegal drugs. Some items may interact with your medicine. ?What should I watch for while using this medication? ?Your condition will be monitored carefully while you are receiving this medicine. ?You may need blood work done while you are taking this medicine. ?This medicine may cause a decrease in vitamin B6. You should make sure that you get enough vitamin B6 while you are taking this medicine. Discuss the foods you eat and the vitamins you take with your health care professional. ?What side effects may I notice from receiving this medication? ?Side effects that you should report to your doctor or health care professional as soon as possible: ?allergic reactions like skin rash, itching or hives, swelling of the face, lips, or tongue ?breathing problems ?changes in vision ?chest pain ?confusion, trouble speaking or understanding ?feeling faint or lightheaded, falls ?high blood   pressure ?muscle aches or pains ?pain, swelling, warmth in the leg ?rapid weight gain ?severe headaches ?sudden numbness or weakness of the face, arm or leg ?trouble walking, dizziness, loss of balance or coordination ?seizures (convulsions) ?swelling of the ankles, feet, hands ?unusually weak or tired ?Side effects that usually do not require  medical attention (report to your doctor or health care professional if they continue or are bothersome): ?diarrhea ?fever, chills (flu-like symptoms) ?headaches ?nausea, vomiting ?redness, stinging, or swelling at site where injected ?This list may not describe all possible side effects. Call your doctor for medical advice about side effects. You may report side effects to FDA at 1-800-FDA-1088. ?Where should I keep my medication? ?Keep out of the reach of children and pets. ?Store in a refrigerator. Do not freeze. Do not shake. Throw away any unused portion if using a single-dose vial. Multi-dose vials can be kept in the refrigerator for up to 21 days after the initial dose. Throw away unused medicine. ?To get rid of medications that are no longer needed or have expired: ?Take the medication to a medication take-back program. Check with your pharmacy or law enforcement to find a location. ?If you cannot return the medication, ask your pharmacist or care team how to get rid of the medication safely. ?NOTE: This sheet is a summary. It may not cover all possible information. If you have questions about this medicine, talk to your doctor, pharmacist, or health care provider. ?? 2023 Elsevier/Gold Standard (2021-01-01 00:00:00) ? ?

## 2021-05-09 ENCOUNTER — Encounter: Payer: Medicare Other | Admitting: Physical Therapy

## 2021-05-09 NOTE — Therapy (Deleted)
OUTPATIENT PHYSICAL THERAPY TREATMENT NOTE   Patient Name: Jo [name] MRN: [id number] DOB:October 04, 1945, 76 y.o., female Today's Date: 05/09/2021  PCP: Horald Pollen, MD REFERRING PROVIDER: [place], [place] OF SESSION:     Past Medical History:  Diagnosis Date   Allergy    Anemia    Arthritis    right knee   Carotid bruit 10/11/2019   Carotid stenosis 10/11/2019   Cataract    forming both   Chronic kidney disease    GERD (gastroesophageal reflux disease)    past hx   Goiter    Hyperlipidemia    Hypertension    Lower extremity edema 10/11/2019   Menopause age 17   Pure hypercholesterolemia 10/11/2019   Reflux    Shortness of breath 12/21/2020   Spinal stenosis    Past Surgical History:  Procedure Laterality Date   ANAL RECTAL MANOMETRY N/A 03/14/2021   Procedure: ANO RECTAL MANOMETRY;  Surgeon: Thornton Park, MD;  Location: [facility];  Service: Gastroenterology;  Laterality: N/A;   CERVICAL SPINE SURGERY  06/15/2010   COLONOSCOPY  2005   normal   LEFT HEART CATH AND CORONARY ANGIOGRAPHY N/A 09/16/2017   Procedure: LEFT HEART CATH AND CORONARY ANGIOGRAPHY;  Surgeon: Dixie Dials, MD;  Location: [place] [facility];  Service: Cardiovascular;  Laterality: N/A;   Patient Active Problem List   Diagnosis Date Noted   Constipation due to outlet dysfunction    Other specified anemias 01/30/2021   Shortness of breath 12/21/2020   MGUS (monoclonal gammopathy of unknown significance) 10/18/2020   Unilateral primary osteoarthritis, right knee 05/24/2020   Flank pain 05/22/2020   Colon cancer screening 05/22/2020   Stage 3a chronic kidney disease ([place]) 10/26/2019   Pure hypercholesterolemia 10/11/2019   Carotid stenosis 10/11/2019   Impaired fasting glucose 12/18/2016   Pre-diabetes 10/25/2014   Essential hypertension    Reflux    Mild anemia     REFERRING DIAG: K59.02 (ICD-10-CM) - 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  PERTINENT HISTORY: No  PRECAUTIONS: None  SUBJECTIVE: I maybe have a little less trouble when doing the things you showed me last time  PAIN:  Are you having pain? No and Yes: NPRS scale: 6/10 Pain location: back Pain description: sore Aggravating factors: walking slow Relieving factors: rest     OBJECTIVE:        COGNITION:            Overall cognitive status: Within functional limits for tasks assessed                            MUSCLE LENGTH: Hamstrings: Right 709 deg; Left 70 deg       GAIT:     POSTURE:  increased thoracic kyphosis   LUMBARAROM/PROM   A/PROM A/PROM  04/04/2021  Flexion    Extension limited   Right lateral flexion    Left lateral flexion    Right rotation    Left rotation     (Blank rows = not tested)   LE ROM: Hip grossly 25% limited     LE MMT:             4/5 hip strength   PELVIC MMT:   MMT   04/04/2021  Vaginal    Internal Anal Sphincter    External Anal Sphincter    Puborectalis    Diastasis Recti    (  Blank rows = not tested)         PALPATION:   General  external palpation only due to pt consent; holding breath and difficulty bulging     TONE: high   TODAY'S TREATMENT  Treatment:04/25/21 Exercises  Breathing with stretches: butterfly, hip rotation, hamstring, piriformis Manual Lumbar and abdominal STM and MFR  Nuero Re-ed Education and cues for coordination of breathing and pelvic floor muscle contracting and relaxing at appropriate times     PATIENT EDUCATION:  Education details: Access Code: [email or web address] Person educated: Patient Education method: Explanation Education comprehension: verbalized understanding     HOME EXERCISE PROGRAM: Access Code: [email or web address] URL: [email or web address] Date: 04/25/2021 Prepared by: Jari Favre  Exercises - Supine Hamstring Stretch with Strap  - 1 x daily - 7 x weekly - 1 sets - 3 reps - 30 sec hold - Supine  Piriformis Stretch with Foot on Ground  - 1 x daily - 7 x weekly - 1 sets - 3 reps - 30 sec hold - Supine Figure 4 Piriformis Stretch  - 1 x daily - 7 x weekly - 1 sets - 3 reps - 30 sec hold - Sidelying Thoracic Lumbar Rotation  - 1 x daily - 7 x weekly - 1 sets - 5 reps - 10 sec hold - Static Prone on Elbows  - 1 x daily - 7 x weekly - 1 sets - 5 reps - 5 hold   ASSESSMENT:   CLINICAL IMPRESSION: Patient is here for initial follow up and reports some improvement.  Pt has stiff lumbar spine and paraspinals.  Some improvement with STM followed by press ups.  Pt did well with stretches and gave HEP to continue to work on improved flexibility and further bulging of the pelvic floor.  Pt will benefit from skilled PT to continue to work on improved BM       OBJECTIVE IMPAIRMENTS decreased activity tolerance, decreased coordination, decreased endurance, decreased knowledge of condition, decreased ROM, decreased strength, increased muscle spasms, impaired flexibility, and impaired tone.    ACTIVITY LIMITATIONS  toileting .    PERSONAL FACTORS Time since onset of injury/illness/exacerbation are also affecting patient's functional outcome.      REHAB POTENTIAL: Good   CLINICAL DECISION MAKING: Evolving/moderate complexity   EVALUATION COMPLEXITY: Low     GOALS: Goals reviewed with patient? Yes   SHORT TERM GOALS: Target date: 04/30/21   Pt will be ind with initial HEP Baseline:  Goal status: INITIAL     LONG TERM GOALS: Target date: 06/26/21   Pt will be independent with advanced HEP to maintain improvements made throughout therapy   Baseline:  Goal status: INITIAL   2.  Pt will report 3 BMs per week due to improved muscle tone and coordination with BMs.  Baseline:  Goal status: INITIAL   3.  Pt will report her BMs are complete due to improved bowel habits and evacuation techniques.  Baseline:  Goal status: INITIAL       PLAN: PT FREQUENCY: 1x/week   PT DURATION: 12  weeks   PLANNED INTERVENTIONS: Therapeutic exercises, Therapeutic activity, Neuromuscular re-education, Balance training, Gait training, Patient/Family education, Joint mobilization, Dry Needling, Electrical stimulation, Cryotherapy, Moist heat, Taping, Biofeedback, and Manual therapy   PLAN FOR NEXT SESSION: f/u on HEP, maybe dry needling if open to it to lumbar multifidi, sitting on ball, butterfly with pelvis elevated on block or wedge    Camillo Flaming Jaxon Flatt, PT 05/09/2021, 8:36 AM

## 2021-05-15 LAB — LIPID PANEL
Chol/HDL Ratio: 3 ratio (ref 0.0–4.4)
Cholesterol, Total: 219 mg/dL — ABNORMAL HIGH (ref 100–199)
HDL: 73 mg/dL (ref >39)
LDL Chol Calc (NIH): 133 mg/dL — ABNORMAL HIGH (ref 0–99)
Triglycerides: 72 mg/dL (ref 0–149)
VLDL Cholesterol Cal: 13 mg/dL (ref 5–40)

## 2021-05-15 LAB — COMPREHENSIVE METABOLIC PANEL
ALT: 12 IU/L (ref 0–32)
AST: 17 IU/L (ref 0–40)
Albumin/Globulin Ratio: 1.4 (ref 1.2–2.2)
Albumin: 4.1 g/dL (ref 3.7–4.7)
Alkaline Phosphatase: 71 IU/L (ref 44–121)
BUN/Creatinine Ratio: 19 (ref 12–28)
BUN: 26 mg/dL (ref 8–27)
Bilirubin Total: 0.3 mg/dL (ref 0.0–1.2)
CO2: 27 mmol/L (ref 20–29)
Calcium: 10.1 mg/dL (ref 8.7–10.3)
Chloride: 103 mmol/L (ref 96–106)
Creatinine, Ser: 1.36 mg/dL — ABNORMAL HIGH (ref 0.57–1.00)
Globulin, Total: 3 g/dL (ref 1.5–4.5)
Glucose: 84 mg/dL (ref 70–99)
Potassium: 5.3 mmol/L — ABNORMAL HIGH (ref 3.5–5.2)
Sodium: 141 mmol/L (ref 134–144)
Total Protein: 7.1 g/dL (ref 6.0–8.5)
eGFR: 40 mL/min/{1.73_m2} — ABNORMAL LOW (ref >59)

## 2021-05-17 ENCOUNTER — Ambulatory Visit: Payer: Medicare Other | Attending: Gastroenterology | Admitting: Physical Therapy

## 2021-05-17 ENCOUNTER — Encounter: Payer: Self-pay | Admitting: Physical Therapy

## 2021-05-17 DIAGNOSIS — M62838 Other muscle spasm: Secondary | ICD-10-CM | POA: Insufficient documentation

## 2021-05-17 DIAGNOSIS — R279 Unspecified lack of coordination: Secondary | ICD-10-CM | POA: Insufficient documentation

## 2021-05-17 NOTE — Therapy (Signed)
?OUTPATIENT PHYSICAL THERAPY TREATMENT NOTE ? ? ?Patient Name: Jo [name] ?MRN: [id number] ?DOB:[birth year], 76 y.o., female ?Today's Date: 05/17/2021 ? ?PCP: Horald Pollen, MD ?REFERRING PROVIDER: Thornton Park, MD ? ?END OF SESSION:  ? PT End of Session - 05/17/21 1529   ? ? Visit Number 3   ? Date for PT Re-Evaluation 06/26/21   ? Authorization Type UHC Medicare   ? PT Start Time 1448   ? PT Stop Time 0349   ? PT Time Calculation (min) 39 min   ? Activity Tolerance Patient tolerated treatment well   ? Behavior During Therapy [facility] for tasks assessed/performed   ? ?  ?  ? ?  ? ? ? ?Past Medical History:  ?Diagnosis Date  ? Allergy   ? Anemia   ? Arthritis   ? right knee  ? Carotid bruit 10/11/2019  ? Carotid stenosis 10/11/2019  ? Cataract   ? forming both  ? Chronic kidney disease   ? GERD (gastroesophageal reflux disease)   ? past hx  ? Goiter   ? Hyperlipidemia   ? Hypertension   ? Lower extremity edema 10/11/2019  ? Menopause age 69  ? Pure hypercholesterolemia 10/11/2019  ? Reflux   ? Shortness of breath 12/21/2020  ? Spinal stenosis   ? ?Past Surgical History:  ?Procedure Laterality Date  ? ANAL RECTAL MANOMETRY N/A 03/14/2021  ? Procedure: ANO RECTAL MANOMETRY;  Surgeon: Thornton Park, MD;  Location: [facility];  Service: Gastroenterology;  Laterality: N/A;  ? CERVICAL SPINE SURGERY  06/15/2010  ? COLONOSCOPY  2005  ? normal  ? LEFT HEART CATH AND CORONARY ANGIOGRAPHY N/A 09/16/2017  ? Procedure: LEFT HEART CATH AND CORONARY ANGIOGRAPHY;  Surgeon: Dixie Dials, MD;  Location: [place] [facility];  Service: Cardiovascular;  Laterality: N/A;  ? ?Patient Active Problem List  ? Diagnosis Date Noted  ? 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 Other specified anemias 01/30/2021  ? Shortness of breath 12/21/2020  ? 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 Unilateral primary osteoarthritis, right knee 05/24/2020  ? Flank pain 05/22/2020  ? Colon cancer screening  05/22/2020  ? Stage 3a chronic kidney disease ([place]) 10/26/2019  ? Pure hypercholesterolemia 10/11/2019  ? Carotid stenosis 10/11/2019  ? Impaired fasting glucose 12/18/2016  ? Pre-diabetes 10/25/2014  ? Essential hypertension   ? Reflux   ? Mild anemia   ? ? ?REFERRING DIAG: K59.02 (ICD-10-CM) - Constipation due to outlet dysfunction ? ?THERAPY DIAG:  ?Unspecified lack of coordination ? ?Other muscle spasm ? ?PERTINENT HISTORY: No ? ?PRECAUTIONS: None ? ?SUBJECTIVE: I feel much better since doing the stretches.  Having a bowel movement every day and not straining ?PAIN:  ?Are you having pain? No and Yes: NPRS scale: 6/10 ?Pain location: back ?Pain description: sore ?Aggravating factors: walking slow ?Relieving factors: rest ? ? ? ? ?OBJECTIVE:  ?  ?  ?  ?COGNITION: ?           Overall cognitive status: Within functional limits for tasks assessed              ?            ?  ?MUSCLE LENGTH: ?Hamstrings: Right 709 deg; Left 70 deg ?  ?  ?  ?GAIT: ?  ?  ?POSTURE:  ?increased thoracic kyphosis ?  ?LUMBARAROM/PROM ?  ?A/PROM A/PROM  ?04/04/2021  ?Flexion    ?Extension limited   ?Right lateral flexion    ?  Left lateral flexion    ?Right rotation    ?Left rotation    ? (Blank rows = not tested) ?  ?LE ROM: ?Hip grossly 25% limited ?  ?  ?LE MMT: ?            4/5 hip strength ?  ?PELVIC MMT: ?  ?MMT   ?04/04/2021  ?Vaginal    ?Internal Anal Sphincter    ?External Anal Sphincter    ?Puborectalis    ?Diastasis Recti    ?(Blank rows = not tested) ?  ?      PALPATION: ?  General  external palpation only due to pt consent; holding breath and difficulty bulging ?  ?  ?TONE: ?high ?  ?TODAY'S TREATMENT  ?Treatment:05/17/21 ?Exercises  ?Squats ?Hip flexor stretch ?Manual ?LumbarSTM and MFR ?Trigger Point Dry-Needling  ?Treatment instructions: Expect mild to moderate muscle soreness. S/S of pneumothorax if dry needled over a lung field, and to seek immediate medical attention should they occur. Patient verbalized understanding of these  instructions and education. ? ?Patient Consent Given: Yes ?Education handout provided: Previously provided ?Muscles treated: lumbar multifidi ?Electrical stimulation performed: No ?Parameters: N/A ?Treatment response/outcome: increased soft tissue length ? ? ? ?Nuero Re-ed ?Education and cues for coordination of breathing and pelvic floor muscle contracting and relaxing at appropriate times ? ?Treatment:04/25/21 ?Exercises  ?Breathing with stretches: butterfly, hip rotation, hamstring, piriformis ?Manual ?Lumbar and abdominal STM and MFR ? ?Nuero Re-ed ?Education and cues for coordination of breathing and pelvic floor muscle contracting and relaxing at appropriate times ? ? ?  ?PATIENT EDUCATION:  ?Education details: Access Code: [email or web address] ?Person educated: Patient ?Education method: Explanation ?Education comprehension: verbalized understanding ?  ?  ?HOME EXERCISE PROGRAM: ?Access Code: [email or web address] ?URL: [email or web address] ?Date: 05/17/2021 ?Prepared by: Jari Favre ? ?Exercises ?- Supine Hamstring Stretch with Strap  - 1 x daily - 7 x weekly - 1 sets - 3 reps - 30 sec hold ?- Supine Piriformis Stretch with Foot on Ground  - 1 x daily - 7 x weekly - 1 sets - 3 reps - 30 sec hold ?- Supine Figure 4 Piriformis Stretch  - 1 x daily - 7 x weekly - 1 sets - 3 reps - 30 sec hold ?- Sidelying Thoracic Lumbar Rotation  - 1 x daily - 7 x weekly - 1 sets - 5 reps - 10 sec hold ?- Static Prone on Elbows  - 1 x daily - 7 x weekly - 1 sets - 5 reps - 5 hold ?- Standing Hip Flexor Stretch  - 3 x daily - 7 x weekly - 1 sets - 2 reps - 30 sec hold ?ASSESSMENT: ?  ?CLINICAL IMPRESSION: ?Patient has been doing much better having bowel movement every day.  Pt was having low back pain today and Rt knee pain . Pt is still needing to work on improving functional movement patterns and posture to make sure she can maintain improvements with bowel movements.  Pt did well with dry needling and when demonstrating  lifting shows tight hip flexors.  Stretch given in order to maintain more neutral spine and thus less tension from lifting things at home.  Pt will benefit from skilled PT to continue to address core strength and functional activities done correctly to minimize any adverse effects. ?  ?  ?  ?OBJECTIVE IMPAIRMENTS decreased activity tolerance, decreased coordination, decreased endurance, decreased knowledge of condition, decreased ROM, decreased strength, increased muscle spasms, impaired flexibility, and impaired tone.  ?  ?  ACTIVITY LIMITATIONS  toileting .  ?  ?PERSONAL FACTORS Time since onset of injury/illness/exacerbation are also affecting patient's functional outcome.  ?  ?  ?REHAB POTENTIAL: Good ?  ?CLINICAL DECISION MAKING: Evolving/moderate complexity ?  ?EVALUATION COMPLEXITY: Low ?  ?  ?GOALS: ?Goals reviewed with patient? Yes ?  ?SHORT TERM GOALS: Target date: 04/30/21 ?  ?Pt will be ind with initial HEP ?Baseline:  ?Goal status: met ?  ?  ?LONG TERM GOALS: Target date: 06/26/21 ?  ?Pt will be independent with advanced HEP to maintain improvements made throughout therapy ?  ?Baseline:  ?Goal status: ongiong ?  ?2.  Pt will report 3 BMs per week due to improved muscle tone and coordination with BMs.  ?Baseline:  ?Goal status: met ?  ?3.  Pt will report her BMs are complete due to improved bowel habits and evacuation techniques.  ?Baseline:  ?Goal status: met ?  ?  ?  ?PLAN: ?PT FREQUENCY: 1x/week ?  ?PT DURATION: 12 weeks ?  ?PLANNED INTERVENTIONS: Therapeutic exercises, Therapeutic activity, Neuromuscular re-education, Balance training, Gait training, Patient/Family education, Joint mobilization, Dry Needling, Electrical stimulation, Cryotherapy, Moist heat, Taping, Biofeedback, and Manual therapy ?  ?PLAN FOR NEXT SESSION: f/u on DN#1 to lumbar multifidi, maybe dry needling if open to it to lumbar multifidi, sacral mobs/distraction, core strength and functional lifting ? ? ?Jule Ser,  PT ?05/17/2021, 4:27 PM ? ?  ? ?

## 2021-05-22 ENCOUNTER — Other Ambulatory Visit: Payer: Medicare Other

## 2021-05-22 ENCOUNTER — Ambulatory Visit: Payer: Medicare Other

## 2021-05-22 ENCOUNTER — Ambulatory Visit: Payer: Medicare Other | Admitting: Oncology

## 2021-05-22 ENCOUNTER — Other Ambulatory Visit: Payer: Self-pay

## 2021-05-22 ENCOUNTER — Inpatient Hospital Stay (HOSPITAL_BASED_OUTPATIENT_CLINIC_OR_DEPARTMENT_OTHER): Payer: Medicare Other | Admitting: Oncology

## 2021-05-22 ENCOUNTER — Inpatient Hospital Stay: Payer: Medicare Other

## 2021-05-22 ENCOUNTER — Inpatient Hospital Stay: Payer: Medicare Other | Attending: Oncology

## 2021-05-22 VITALS — BP 150/73 | HR 70 | Temp 97.8°F | Resp 18 | Ht 66.0 in | Wt 165.5 lb

## 2021-05-22 DIAGNOSIS — D472 Monoclonal gammopathy: Secondary | ICD-10-CM | POA: Diagnosis present

## 2021-05-22 DIAGNOSIS — D631 Anemia in chronic kidney disease: Secondary | ICD-10-CM | POA: Diagnosis present

## 2021-05-22 DIAGNOSIS — N186 End stage renal disease: Secondary | ICD-10-CM | POA: Diagnosis present

## 2021-05-22 DIAGNOSIS — D6489 Other specified anemias: Secondary | ICD-10-CM | POA: Diagnosis not present

## 2021-05-22 LAB — CBC WITH DIFFERENTIAL (CANCER CENTER ONLY)
Abs Immature Granulocytes: 0.01 10*3/uL (ref 0.00–0.07)
Basophils Absolute: 0 10*3/uL (ref 0.0–0.1)
Basophils Relative: 0 %
Eosinophils Absolute: 0.2 10*3/uL (ref 0.0–0.5)
Eosinophils Relative: 3 %
HCT: 40.6 % (ref 36.0–46.0)
Hemoglobin: 12.8 g/dL (ref 12.0–15.0)
Immature Granulocytes: 0 %
Lymphocytes Relative: 31 %
Lymphs Abs: 2 10*3/uL (ref 0.7–4.0)
MCH: 28.4 pg (ref 26.0–34.0)
MCHC: 31.5 g/dL (ref 30.0–36.0)
MCV: 90 fL (ref 80.0–100.0)
Monocytes Absolute: 0.7 10*3/uL (ref 0.1–1.0)
Monocytes Relative: 10 %
Neutro Abs: 3.5 10*3/uL (ref 1.7–7.7)
Neutrophils Relative %: 56 %
Platelet Count: 241 10*3/uL (ref 150–400)
RBC: 4.51 MIL/uL (ref 3.87–5.11)
RDW: 14.9 % (ref 11.5–15.5)
WBC Count: 6.4 10*3/uL (ref 4.0–10.5)
nRBC: 0 % (ref 0.0–0.2)

## 2021-05-22 NOTE — Progress Notes (Signed)
Hematology and Oncology Follow Up Visit ? ?Jo [name] ?[id number] ?03/30/1945 76 y.o. ?05/22/2021 9:09 AM ?Sagardia, Miguel Jose, MDSagardia, Miguel Jose, *  ? 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  She has no evidence of endorgan damage to suggest multiple myeloma. ? 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 ? ? ?Current therapy: Aranesp 300 mcg every 3 weeks started on February 06, 2021. ? ?Interim History: Jo [name] is here for a follow-up visit.  Since last visit, she reports feeling well without any major complaints.  She tolerated Aranesp with improvement in her energy and performance status.  She denies any chest pain or shortness of breath.  She denies any hospitalizations or illnesses. ? ? ? ?Medications: Updated on review. ?Current Outpatient Medications  ?Medication Sig Dispense Refill  ? acetaminophen (TYLENOL) 325 MG tablet Take 325 mg by mouth every 6 (six) hours as needed for moderate pain.    ? aspirin EC 81 MG tablet Take 81 mg by mouth at bedtime.    ? Cholecalciferol (DIALYVITE VITAMIN D 5000) 125 MCG (5000 UT) capsule Take 5,000 Units by mouth at bedtime.    ? Evolocumab (REPATHA SURECLICK) 140 MG/ML SOAJ Inject 140 mg into the skin every 14 (fourteen) days. 2 mL 11  ? FARXIGA 10 MG TABS tablet Take 10 mg by mouth daily.    ? Ferrous Sulfate (IRON SLOW RELEASE PO) Take 1 tablet by mouth daily.    ? lisinopril (ZESTRIL) 10 MG tablet Take 10 mg by mouth daily.    ? ondansetron (ZOFRAN) 4 MG tablet Take 1 tablet (4 mg total) by mouth every 8 (eight) hours as needed for nausea or vomiting. 10 tablet 1  ? polyethylene glycol (MIRALAX / GLYCOLAX) packet Take 17 g by mouth daily as needed (constipation). Mix in 8 oz liquid and drink    ? Probiotic Product (DIGESTIVE ADVANTAGE) CAPS Take 1 capsule by mouth daily.    ? vitamin B-12 (CYANOCOBALAMIN) 1000 MCG tablet Take 1,000 mcg by mouth at bedtime.      ? ?No current facility-administered medications for this visit.  ? ? ? ?Allergies:  ?Allergies  ?Allergen Reactions  ? Demerol Nausea And Vomiting  ?  Severe nausea and vomiting  ? Crestor [Rosuvastatin]   ?  Fatigue. Sob, myalgias, restless legs  ? Lipitor [Atorvastatin]   ?  myalgias  ? ? ? ? ?Physical Exam: ? ? ?Blood pressure (!) 150/73, pulse 70, temperature 97.8 ?F (36.6 ?C), temperature source Temporal, resp. rate 18, height 5' 6" (1.676 m), weight 165 lb 8 oz (75.1 kg), SpO2 100 %. ? ? ?ECOG: 1 ? ? ?General appearance: Alert, awake without any distress. ?Head: Atraumatic without abnormalities ?Oropharynx: Without any thrush or ulcers. ?Eyes: No scleral icterus. ?Lymph nodes: No lymphadenopathy noted in the cervical, supraclavicular, or axillary nodes ?Heart:regular rate and rhythm, without any murmurs or gallops.   ?Lung: Clear to auscultation without any rhonchi, wheezes or dullness to percussion. ?Abdomin: Soft, nontender without any shifting dullness or ascites. ?Musculoskeletal: No clubbing or cyanosis. ?Neurological: No motor or sensory deficits. ?Skin: No rashes or lesions. ? ? ? ? ? ?Lab Results: ?Lab Results  ?Component Value Date  ? WBC 7.8 05/01/2021  ? HGB 10.9 (L) 05/01/2021  ? HCT 34.4 (L) 05/01/2021  ? MCV 88.9 05/01/2021  ? PLT 198 05/01/2021  ? ?  Chemistry   ?   ?Component Value Date/Time  ?   NA 141 05/15/2021 1032  ? K 5.3 (H) 05/15/2021 1032  ? CL 103 05/15/2021 1032  ? CO2 27 05/15/2021 1032  ? BUN 26 05/15/2021 1032  ? CREATININE 1.36 (H) 05/15/2021 1032  ? CREATININE 1.44 (H) 01/23/2021 1108  ? CREATININE 0.89 12/13/2015 1102  ? GLU 88 07/25/2010 0000  ?    ?Component Value Date/Time  ? CALCIUM 10.1 05/15/2021 1032  ? ALKPHOS 71 05/15/2021 1032  ? AST 17 05/15/2021 1032  ? AST 14 (L) 01/23/2021 1108  ? ALT 12 05/15/2021 1032  ? ALT 7 01/23/2021 1108  ? BILITOT 0.3 05/15/2021 1032  ? BILITOT 0.4 01/23/2021 1108  ?  ? ? ? ? ?Impression and Plan: ? ?76-year-old woman with: ? ?1.  MGUS  diagnosed in 2022 with an M spike of around 0.2 g/dL.  She was found to have IgG kappa subtype. ? ? ?She is currently on active surveillance without any evidence to suggest endorgan damage.  Laboratory data from January 2023 showed normal quantitative immunoglobulins.  I recommended continued active surveillance and repeat protein studies in January 2024. ?  ?  ?2.  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  She received Aranesp 300 mcg every 3 weeks with the last injection given on April 18.  Her hemoglobin today is back to normal range and we will continue to monitor at this time.  Risks and benefits of continuing Aranesp were reviewed.  I recommended monthly Aranesp evaluation with laboratory testing and repeat injection for hemoglobin of less than 11.  She is agreeable. ? ?3.  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 ?  ?  ?4.  Follow-up: Monthly for Aranesp and MD follow-up in 6 months. ?  ? ?30  minutes were dedicated to this visit.  The time was spent on reviewing laboratory data, disease status update management choices for the future. ? ? ? ?Firas Shadad, MD ?5/9/20239:09 AM ? ?

## 2021-05-23 ENCOUNTER — Ambulatory Visit: Payer: Medicare Other | Admitting: Physical Therapy

## 2021-05-23 ENCOUNTER — Encounter: Payer: Self-pay | Admitting: Physical Therapy

## 2021-05-23 DIAGNOSIS — R279 Unspecified lack of coordination: Secondary | ICD-10-CM

## 2021-05-23 DIAGNOSIS — M62838 Other muscle spasm: Secondary | ICD-10-CM

## 2021-05-23 NOTE — Therapy (Signed)
?OUTPATIENT PHYSICAL THERAPY TREATMENT NOTE ? ? ?Patient Name: Jo [name] ?MRN: [id number] ?DOB:Aug 28, 1945, 76 y.o., female ?Today's Date: 05/23/2021 ? ?PCP: Horald Pollen, MD ?REFERRING PROVIDER: Horald Pollen, * ? ?END OF SESSION:  ? PT End of Session - 05/23/21 1451   ? ? Visit Number 4   ? Date for PT Re-Evaluation 06/26/21   ? Authorization Type [employer]HC Medicare   ? PT Start Time 3244   ? PT Stop Time 1528   ? PT Time Calculation (min) 39 min   ? Activity Tolerance Patient tolerated treatment well   ? Behavior During Therapy [facility] for tasks assessed/performed   ? ?  ?  ? ?  ? ? ? ? ?Past Medical History:  ?Diagnosis Date  ? Allergy   ? Anemia   ? Arthritis   ? right knee  ? Carotid bruit 10/11/2019  ? Carotid stenosis 10/11/2019  ? Cataract   ? forming both  ? Chronic kidney disease   ? GERD (gastroesophageal reflux disease)   ? past hx  ? Goiter   ? Hyperlipidemia   ? Hypertension   ? Lower extremity edema 10/11/2019  ? Menopause age 24  ? Pure hypercholesterolemia 10/11/2019  ? Reflux   ? Shortness of breath 12/21/2020  ? Spinal stenosis   ? ?Past Surgical History:  ?Procedure Laterality Date  ? ANAL RECTAL MANOMETRY N/A 03/14/2021  ? Procedure: ANO RECTAL MANOMETRY;  Surgeon: Thornton Park, MD;  Location: [facility];  Service: Gastroenterology;  Laterality: N/A;  ? CERVICAL SPINE SURGERY  06/15/2010  ? COLONOSCOPY  2005  ? normal  ? LEFT HEART CATH AND CORONARY ANGIOGRAPHY N/A 09/16/2017  ? Procedure: LEFT HEART CATH AND CORONARY ANGIOGRAPHY;  Surgeon: Dixie Dials, MD;  Location: [place] [facility];  Service: Cardiovascular;  Laterality: N/A;  ? ?Patient Active Problem List  ? Diagnosis Date Noted  ? 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 Other specified anemias 01/30/2021  ? Shortness of breath 12/21/2020  ? 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 Unilateral primary osteoarthritis, right knee 05/24/2020  ? Flank pain 05/22/2020  ? Colon cancer screening  05/22/2020  ? Stage 3a chronic kidney disease ([place]) 10/26/2019  ? Pure hypercholesterolemia 10/11/2019  ? Carotid stenosis 10/11/2019  ? Impaired fasting glucose 12/18/2016  ? Pre-diabetes 10/25/2014  ? Essential hypertension   ? Reflux   ? Mild anemia   ? ? ?REFERRING DIAG: K59.02 (ICD-10-CM) - Constipation due to outlet dysfunction ? ?THERAPY DIAG:  ?Unspecified lack of coordination ? ?Other muscle spasm ? ?PERTINENT HISTORY: No ? ?PRECAUTIONS: None ? ?SUBJECTIVE: I have really bad headache today because I gave myself a shot and that always happens.  Pt states my hip really got bad in the last 2 days. ?PAIN:  ?Are you having pain? No and Yes: NPRS scale: 8/10 ?Pain location: Rt hip ?Pain description: hurts ?Aggravating factors: walking ?Relieving factors: rest ? ? ? ? ?OBJECTIVE:  ?  ?  ?  ?COGNITION: ?           Overall cognitive status: Within functional limits for tasks assessed              ?            ?  ?MUSCLE LENGTH: ?Hamstrings: Right 709 deg; Left 70 deg ?  ?  ?  ?GAIT: ?  ?  ?POSTURE:  ?increased thoracic kyphosis ?  ?LUMBARAROM/PROM ?  ?A/PROM A/PROM  ?04/04/2021  ?Flexion    ?  Extension limited   ?Right lateral flexion    ?Left lateral flexion    ?Right rotation    ?Left rotation    ? (Blank rows = not tested) ?  ?LE ROM: ?Hip grossly 25% limited ?  ?  ?LE MMT: ?            4/5 hip strength ?  ?PELVIC MMT: ?  ?MMT   ?04/04/2021  ?Vaginal    ?Internal Anal Sphincter    ?External Anal Sphincter    ?Puborectalis    ?Diastasis Recti    ?(Blank rows = not tested) ?  ?      PALPATION: ?  General  external palpation only due to pt consent; holding breath and difficulty bulging ?  ?  ?TONE: ?high ?  ?TODAY'S TREATMENT  ?Treatment:05/23/21 ?Exercises  ?Bridges 2x 10 ?Clam 10x each side ?Squat 10x to elevated tables ? ?Manual ?[place] to Rt gluteals and STM around greater trochanter ? ?Treatment:05/17/21 ?Exercises  ?Squats ?Hip flexor stretch ?Manual ?LumbarSTM and MFR ?Trigger Point Dry-Needling  ?Treatment  instructions: Expect mild to moderate muscle soreness. S/S of pneumothorax if dry needled over a lung field, and to seek immediate medical attention should they occur. Patient verbalized understanding of these instructions and education. ? ?Patient Consent Given: Yes ?Education handout provided: Previously provided ?Muscles treated: lumbar multifidi ?Electrical stimulation performed: No ?Parameters: N/A ?Treatment response/outcome: increased soft tissue length ? ? ? ?Nuero Re-ed ?Education and cues for coordination of breathing and pelvic floor muscle contracting and relaxing at appropriate times ? ?Treatment:04/25/21 ?Exercises  ?Breathing with stretches: butterfly, hip rotation, hamstring, piriformis ?Manual ?Lumbar and abdominal STM and MFR ? ?Nuero Re-ed ?Education and cues for coordination of breathing and pelvic floor muscle contracting and relaxing at appropriate times ? ? ?  ?PATIENT EDUCATION:  ?Education details: Access Code: [id number] ?Person educated: Patient ?Education method: Explanation ?Education comprehension: verbalized understanding ?  ?  ?HOME EXERCISE PROGRAM: ?Access Code: [email or web address] ?URL: [email or web address] ?Date: 05/17/2021 ?Prepared by: Jari Favre ? ?Exercises ?- Supine Hamstring Stretch with Strap  - 1 x daily - 7 x weekly - 1 sets - 3 reps - 30 sec hold ?- Supine Piriformis Stretch with Foot on Ground  - 1 x daily - 7 x weekly - 1 sets - 3 reps - 30 sec hold ?- Supine Figure 4 Piriformis Stretch  - 1 x daily - 7 x weekly - 1 sets - 3 reps - 30 sec hold ?- Sidelying Thoracic Lumbar Rotation  - 1 x daily - 7 x weekly - 1 sets - 5 reps - 10 sec hold ?- Static Prone on Elbows  - 1 x daily - 7 x weekly - 1 sets - 5 reps - 5 hold ?- Standing Hip Flexor Stretch  - 3 x daily - 7 x weekly - 1 sets - 2 reps - 30 sec hold ?ASSESSMENT: ?  ?CLINICAL IMPRESSION: ?Patient has been still having bowel movement every day.  She was having a lot of hip and SIJ pain today.  Pt had good  [facility] from [facility] and began core and pelvic strengthening.  Pt did well with exercises and able to maintain neutral pelvis and no increased pain. Pt felt less pain standing up after STM and exercises.  She was given updates to HEP to continue to progress strength.  Pt will benefit from skilled PT to continue to address core strength and functional activities done correctly to minimize any adverse effects. ?  ?  ?  ?  OBJECTIVE IMPAIRMENTS decreased activity tolerance, decreased coordination, decreased endurance, decreased knowledge of condition, decreased ROM, decreased strength, increased muscle spasms, impaired flexibility, and impaired tone.  ?  ?ACTIVITY LIMITATIONS  toileting .  ?  ?PERSONAL FACTORS Time since onset of injury/illness/exacerbation are also affecting patient's functional outcome.  ?  ?  ?REHAB POTENTIAL: Good ?  ?CLINICAL DECISION MAKING: Evolving/moderate complexity ?  ?EVALUATION COMPLEXITY: Low ?  ?  ?GOALS: ?Goals reviewed with patient? Yes ?  ?SHORT TERM GOALS: Target date: 04/30/21 ?  ?Pt will be ind with initial HEP ?Baseline:  ?Goal status: met ?  ?  ?LONG TERM GOALS: Target date: 06/26/21 ?  ?Pt will be independent with advanced HEP to maintain improvements made throughout therapy ?  ?Baseline:  ?Goal status: ongiong ?  ?2.  Pt will report 3 BMs per week due to improved muscle tone and coordination with BMs.  ?Baseline:  ?Goal status: met ?  ?3.  Pt will report her BMs are complete due to improved bowel habits and evacuation techniques.  ?Baseline:  ?Goal status: met ?  ?  ?  ?PLAN: ?PT FREQUENCY: 1x/week ?  ?PT DURATION: 12 weeks ?  ?PLANNED INTERVENTIONS: Therapeutic exercises, Therapeutic activity, Neuromuscular re-education, Balance training, Gait training, Patient/Family education, Joint mobilization, Dry Needling, Electrical stimulation, Cryotherapy, Moist heat, Taping, Biofeedback, and Manual therapy ?  ?PLAN FOR NEXT SESSION: f/u on DN#1 to lumbar multifidi, maybe dry needling if  open to it to lumbar multifidi, sacral mobs/distraction, core strength and functional lifting ? ? ?Jule Ser, PT ?05/23/2021, 2:53 PM ? ?  ? ?

## 2021-05-30 ENCOUNTER — Ambulatory Visit: Payer: Medicare Other | Admitting: Physical Therapy

## 2021-05-30 ENCOUNTER — Encounter: Payer: Self-pay | Admitting: Physical Therapy

## 2021-05-30 DIAGNOSIS — R279 Unspecified lack of coordination: Secondary | ICD-10-CM | POA: Diagnosis not present

## 2021-05-30 DIAGNOSIS — M62838 Other muscle spasm: Secondary | ICD-10-CM

## 2021-05-30 NOTE — Therapy (Signed)
?OUTPATIENT PHYSICAL THERAPY TREATMENT NOTE ? ? ?Patient Name: Jo [name] ?MRN: [id number] ?DOB:[birth year], 76 y.o., female ?Today's Date: 05/30/2021 ? ?PCP: Horald Pollen, MD ?REFERRING PROVIDER: Thornton Park, MD ? ?END OF SESSION:  ? PT End of Session - 05/30/21 1450   ? ? Visit Number 5   ? Date for PT Re-Evaluation 06/26/21   ? Authorization Type UHC Medicare   ? PT Start Time 1450   ? PT Stop Time 1528   ? PT Time Calculation (min) 38 min   ? Activity Tolerance Patient tolerated treatment well   ? Behavior During Therapy [facility] for tasks assessed/performed   ? ?  ?  ? ?  ? ? ? ? ? ?Past Medical History:  ?Diagnosis Date  ? Allergy   ? Anemia   ? Arthritis   ? right knee  ? Carotid bruit 10/11/2019  ? Carotid stenosis 10/11/2019  ? Cataract   ? forming both  ? Chronic kidney disease   ? GERD (gastroesophageal reflux disease)   ? past hx  ? Goiter   ? Hyperlipidemia   ? Hypertension   ? Lower extremity edema 10/11/2019  ? Menopause age 74  ? Pure hypercholesterolemia 10/11/2019  ? Reflux   ? Shortness of breath 12/21/2020  ? Spinal stenosis   ? ?Past Surgical History:  ?Procedure Laterality Date  ? ANAL RECTAL MANOMETRY N/A 03/14/2021  ? Procedure: ANO RECTAL MANOMETRY;  Surgeon: Thornton Park, MD;  Location: [facility];  Service: Gastroenterology;  Laterality: N/A;  ? CERVICAL SPINE SURGERY  06/15/2010  ? COLONOSCOPY  2005  ? normal  ? LEFT HEART CATH AND CORONARY ANGIOGRAPHY N/A 09/16/2017  ? Procedure: LEFT HEART CATH AND CORONARY ANGIOGRAPHY;  Surgeon: Dixie Dials, MD;  Location: [facility];  Service: Cardiovascular;  Laterality: N/A;  ? ?Patient Active Problem List  ? Diagnosis Date Noted  ? 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 Other specified anemias 01/30/2021  ? Shortness of breath 12/21/2020  ? 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 Unilateral primary osteoarthritis, right knee 05/24/2020  ? Flank pain 05/22/2020  ? Colon cancer screening  05/22/2020  ? Stage 3a chronic kidney disease ([place]) 10/26/2019  ? Pure hypercholesterolemia 10/11/2019  ? Carotid stenosis 10/11/2019  ? Impaired fasting glucose 12/18/2016  ? Pre-diabetes 10/25/2014  ? Essential hypertension   ? Reflux   ? Mild anemia   ? ? ?REFERRING DIAG: K59.02 (ICD-10-CM) - Constipation due to outlet dysfunction ? ?THERAPY DIAG:  ?Unspecified lack of coordination ? ?Other muscle spasm ? ?PERTINENT HISTORY: No ? ?PRECAUTIONS: None ? ?SUBJECTIVE: I am some better. I have been going to the gym and walking. Hip is much better. ?PAIN:  ?Are you having pain? No and Yes: NPRS scale: 5/10 ?Pain location: Rt hip ?Pain description: hurts ?Aggravating factors: walking ?Relieving factors: rest ? ? ? ? ?OBJECTIVE:  ?  ?  ?  ?COGNITION: ?           Overall cognitive status: Within functional limits for tasks assessed              ?            ?  ?MUSCLE LENGTH: ?Hamstrings: Right 70 deg; Left 70 deg ?  ?  ?  ?GAIT: ?  ?  ?POSTURE:  ?increased thoracic kyphosis ?  ?LUMBARAROM/PROM ?  ?A/PROM A/PROM  ?04/04/2021  ?Flexion    ?Extension limited   ?Right lateral flexion    ?  Left lateral flexion    ?Right rotation    ?Left rotation    ? (Blank rows = not tested) ?  ?LE ROM: ?Hip grossly 25% limited ?  ?  ?LE MMT: ?            4/5 hip strength ?  ?PELVIC MMT: ?  ?MMT   ?04/04/2021  ?Vaginal    ?Internal Anal Sphincter    ?External Anal Sphincter    ?Puborectalis    ?Diastasis Recti    ?(Blank rows = not tested) ?  ?      PALPATION: ?  General  external palpation only due to pt consent; holding breath and difficulty bulging ?  ?  ?TONE: ?high ?  ?TODAY'S TREATMENT  ?Treatment:05/30/21 ?Exercises  ?Woodpeckers 10x each side ?Hip abduction neutral and IR 20x ? ?Manual ?Rt gluteals and STM around greater trochanter and lumbar ?Trigger Point Dry-Needling  ?Treatment instructions: Expect mild to moderate muscle soreness. S/S of pneumothorax if dry needled over a lung field, and to seek immediate medical attention should  they occur. Patient verbalized understanding of these instructions and education. ? ?Patient Consent Given: Yes ?Education handout provided: Previously provided ?Muscles treated: glute med/min ?Electrical stimulation performed: No ?Parameters: N/A ?Treatment response/outcome: increased muscle length ? ? ?Treatment:05/23/21 ?Exercises  ?Bridges 2x 10 ?Clam 10x each side ?Squat 10x to elevated tables ? ?Manual ?Addaday to Rt gluteals and STM around greater trochanter ? ?Treatment:05/17/21 ?Exercises  ?Squats ?Hip flexor stretch ?Manual ?LumbarSTM and MFR ?Trigger Point Dry-Needling  ?Treatment instructions: Expect mild to moderate muscle soreness. S/S of pneumothorax if dry needled over a lung field, and to seek immediate medical attention should they occur. Patient verbalized understanding of these instructions and education. ? ?Patient Consent Given: Yes ?Education handout provided: Previously provided ?Muscles treated: lumbar multifidi ?Electrical stimulation performed: No ?Parameters: N/A ?Treatment response/outcome: increased soft tissue length ? ? ? ?Nuero Re-ed ?Education and cues for coordination of breathing and pelvic floor muscle contracting and relaxing at appropriate times ? ? ? ?  ?PATIENT EDUCATION:  ?Education details: Access Code: [email or web address] ?Person educated: Patient ?Education method: Explanation ?Education comprehension: verbalized understanding ?  ?  ?HOME EXERCISE PROGRAM: ?Access Code: [email or web address] ?URL: [email or web address] ?Date: 05/17/2021 ?Prepared by: Jari Favre ? ?Exercises ?- Supine Hamstring Stretch with Strap  - 1 x daily - 7 x weekly - 1 sets - 3 reps - 30 sec hold ?- Supine Piriformis Stretch with Foot on Ground  - 1 x daily - 7 x weekly - 1 sets - 3 reps - 30 sec hold ?- Supine Figure 4 Piriformis Stretch  - 1 x daily - 7 x weekly - 1 sets - 3 reps - 30 sec hold ?- Sidelying Thoracic Lumbar Rotation  - 1 x daily - 7 x weekly - 1 sets - 5 reps - 10 sec hold ?- Static  Prone on Elbows  - 1 x daily - 7 x weekly - 1 sets - 5 reps - 5 hold ?- Standing Hip Flexor Stretch  - 3 x daily - 7 x weekly - 1 sets - 2 reps - 30 sec hold ?ASSESSMENT: ?  ?CLINICAL IMPRESSION: ?Patient has been exercises and still having bowel movement without straining.  Pt has less pain in gluteals, still 5/10.  Pt able to do gluteal strengthening today needing a lot of external cues.  Pt did well but will need another review before adding to HEP. the patient did well with dry needling and STM and reports 0/10  at the end of treatment. Pt will benefit from skilled PT to address strength and muscle length for improved pelvic stability ? ?  ?  ?  ?OBJECTIVE IMPAIRMENTS decreased activity tolerance, decreased coordination, decreased endurance, decreased knowledge of condition, decreased ROM, decreased strength, increased muscle spasms, impaired flexibility, and impaired tone.  ?  ?ACTIVITY LIMITATIONS  toileting .  ?  ?PERSONAL FACTORS Time since onset of injury/illness/exacerbation are also affecting patient's functional outcome.  ?  ?  ?REHAB POTENTIAL: Good ?  ?CLINICAL DECISION MAKING: Evolving/moderate complexity ?  ?EVALUATION COMPLEXITY: Low ?  ?  ?GOALS: ?Goals reviewed with patient? Yes ?  ?SHORT TERM GOALS: Target date: 04/30/21 ?  ?Pt will be ind with initial HEP ?Baseline:  ?Goal status: met ?  ?  ?LONG TERM GOALS: Target date: 06/26/21 ?  ?Pt will be independent with advanced HEP to maintain improvements made throughout therapy ?  ?Baseline: cont to work on core and hip strength for final HEP in order to do without pain 05/30/21 ? ?Goal status: ongiong ?  ?2.  Pt will report 3 BMs per week due to improved muscle tone and coordination with BMs.  ?Baseline:  ?Goal status: met ?  ?3.  Pt will report her BMs are complete due to improved bowel habits and evacuation techniques.  ?Baseline:  ?Goal status: met ?  ?  ?  ?PLAN: ?PT FREQUENCY: 1x/week ?  ?PT DURATION: 12 weeks ?  ?PLANNED INTERVENTIONS: Therapeutic  exercises, Therapeutic activity, Neuromuscular re-education, Balance training, Gait training, Patient/Family education, Joint mobilization, Dry Needling, Electrical stimulation, Cryotherapy, Moist heat, Ta

## 2021-06-01 ENCOUNTER — Telehealth: Payer: Self-pay | Admitting: Oncology

## 2021-06-01 NOTE — Telephone Encounter (Signed)
Called patient regarding monthly appointments, patient is notified.

## 2021-06-06 ENCOUNTER — Ambulatory Visit: Payer: Medicare Other | Admitting: Physical Therapy

## 2021-06-06 ENCOUNTER — Encounter: Payer: Self-pay | Admitting: Physical Therapy

## 2021-06-06 DIAGNOSIS — R279 Unspecified lack of coordination: Secondary | ICD-10-CM

## 2021-06-06 DIAGNOSIS — M62838 Other muscle spasm: Secondary | ICD-10-CM

## 2021-06-06 NOTE — Therapy (Signed)
OUTPATIENT PHYSICAL THERAPY TREATMENT NOTE   Patient Name: Jo [name] MRN: [id number] DOB:Jan 14, 1946, 76 y.o., female Today's Date: 06/06/2021  PCP: Horald Pollen, MD REFERRING PROVIDER: [place], [place] OF SESSION:   PT End of Session - 06/06/21 1455     Visit Number 6    Date for PT Re-Evaluation 06/26/21    Authorization Type UHC Medicare    PT Start Time 1452    PT Stop Time 4174    PT Time Calculation (min) 38 min    Activity Tolerance Patient tolerated treatment well    Behavior During Therapy [facility] for tasks assessed/performed                 Past Medical History:  Diagnosis Date   Allergy    Anemia    Arthritis    right knee   Carotid bruit 10/11/2019   Carotid stenosis 10/11/2019   Cataract    forming both   Chronic kidney disease    GERD (gastroesophageal reflux disease)    past hx   Goiter    Hyperlipidemia    Hypertension    Lower extremity edema 10/11/2019   Menopause age 89   Pure hypercholesterolemia 10/11/2019   Reflux    Shortness of breath 12/21/2020   Spinal stenosis    Past Surgical History:  Procedure Laterality Date   ANAL RECTAL MANOMETRY N/A 03/14/2021   Procedure: ANO RECTAL MANOMETRY;  Surgeon: Thornton Park, MD;  Location: [facility];  Service: Gastroenterology;  Laterality: N/A;   CERVICAL SPINE SURGERY  06/15/2010   COLONOSCOPY  2005   normal   LEFT HEART CATH AND CORONARY ANGIOGRAPHY N/A 09/16/2017   Procedure: LEFT HEART CATH AND CORONARY ANGIOGRAPHY;  Surgeon: Dixie Dials, MD;  Location: [facility];  Service: Cardiovascular;  Laterality: N/A;   Patient Active Problem List   Diagnosis Date Noted   Constipation due to outlet dysfunction    Other specified anemias 01/30/2021   Shortness of breath 12/21/2020   MGUS (monoclonal gammopathy of unknown significance) 10/18/2020   Unilateral primary osteoarthritis, right knee 05/24/2020   Flank pain 05/22/2020   Colon cancer  screening 05/22/2020   Stage 3a chronic kidney disease ([place]) 10/26/2019   Pure hypercholesterolemia 10/11/2019   Carotid stenosis 10/11/2019   Impaired fasting glucose 12/18/2016   Pre-diabetes 10/25/2014   Essential hypertension    Reflux    Mild anemia     REFERRING DIAG: K59.02 (ICD-10-CM) - Constipation due to outlet dysfunction  THERAPY DIAG:  Unspecified lack of coordination  Other muscle spasm  PERTINENT HISTORY: No  PRECAUTIONS: None  SUBJECTIVE: I am 95% better.  Feeling good with the exercises and HEP PAIN:  Are you having pain? No     OBJECTIVE:        COGNITION:            Overall cognitive status: Within functional limits for tasks assessed                            MUSCLE LENGTH: Hamstrings: Right 70 deg; Left 70 deg       GAIT:     POSTURE:  increased thoracic kyphosis   LUMBARAROM/PROM   A/PROM A/PROM  04/04/2021  Flexion    Extension limited   Right lateral flexion    Left lateral flexion    Right rotation    Left rotation     (Blank rows =  not tested)   LE ROM: Hip grossly 25% limited     LE MMT:             4/5 hip strength   PELVIC MMT:   MMT   04/04/2021  Vaginal    Internal Anal Sphincter    External Anal Sphincter    Puborectalis    Diastasis Recti    (Blank rows = not tested)         PALPATION:   General  external palpation only due to pt consent; holding breath and difficulty bulging     TONE: high   TODAY'S TREATMENT  Treatment:06/06/21 Exercises  Hip flexor stretch Half kneel quad stretch Pigeon pose on mat Squat 20x Sit to stand 10x Sit to stand single leg 5x each Review all HEP and discussed yoga pose variations   Treatment:05/30/21 Exercises  Woodpeckers 10x each side Hip abduction neutral and IR 20x  Manual Rt gluteals and STM around greater trochanter and lumbar Trigger Point Dry-Needling  Treatment instructions: Expect mild to moderate muscle soreness. S/S of pneumothorax if dry  needled over a lung field, and to seek immediate medical attention should they occur. Patient verbalized understanding of these instructions and education.  Patient Consent Given: Yes Education handout provided: Previously provided Muscles treated: glute med/min Electrical stimulation performed: No Parameters: N/A Treatment response/outcome: increased muscle length   Treatment:05/23/21 Exercises  Bridges 2x 10 Clam 10x each side Squat 10x to elevated tables  Manual Addaday to Rt gluteals and STM around greater trochanter  Treatment:05/17/21 Exercises  Squats Hip flexor stretch Manual LumbarSTM and MFR Trigger Point Dry-Needling  Treatment instructions: Expect mild to moderate muscle soreness. S/S of pneumothorax if dry needled over a lung field, and to seek immediate medical attention should they occur. Patient verbalized understanding of these instructions and education.  Patient Consent Given: Yes Education handout provided: Previously provided Muscles treated: lumbar multifidi Electrical stimulation performed: No Parameters: N/A Treatment response/outcome: increased soft tissue length    Nuero Re-ed Education and cues for coordination of breathing and pelvic floor muscle contracting and relaxing at appropriate times      PATIENT EDUCATION:  Education details: Access Code: [email or web address] Person educated: Patient Education method: Explanation Education comprehension: verbalized understanding     HOME EXERCISE PROGRAM: Access Code: [email or web address] URL: [email or web address] Date: 05/17/2021 Prepared by: Jari Favre  Exercises - Supine Hamstring Stretch with Strap  - 1 x daily - 7 x weekly - 1 sets - 3 reps - 30 sec hold - Supine Piriformis Stretch with Foot on Ground  - 1 x daily - 7 x weekly - 1 sets - 3 reps - 30 sec hold - Supine Figure 4 Piriformis Stretch  - 1 x daily - 7 x weekly - 1 sets - 3 reps - 30 sec hold - Sidelying Thoracic Lumbar  Rotation  - 1 x daily - 7 x weekly - 1 sets - 5 reps - 10 sec hold - Static Prone on Elbows  - 1 x daily - 7 x weekly - 1 sets - 5 reps - 5 hold - Standing Hip Flexor Stretch  - 3 x daily - 7 x weekly - 1 sets - 2 reps - 30 sec hold ASSESSMENT:   CLINICAL IMPRESSION: Patient has been exercises and still having bowel movement without straining.  Pt has less pain in gluteals, still 5/10.  Pt able to do gluteal strengthening today needing a lot of external cues.  Pt did well but will need another  review before adding to HEP. the patient did well with dry needling and STM and reports 0/10 at the end of treatment. Pt will benefit from skilled PT to address strength and muscle length for improved pelvic stability        OBJECTIVE IMPAIRMENTS decreased activity tolerance, decreased coordination, decreased endurance, decreased knowledge of condition, decreased ROM, decreased strength, increased muscle spasms, impaired flexibility, and impaired tone.    ACTIVITY LIMITATIONS  toileting .    PERSONAL FACTORS Time since onset of injury/illness/exacerbation are also affecting patient's functional outcome.      REHAB POTENTIAL: Good   CLINICAL DECISION MAKING: Evolving/moderate complexity   EVALUATION COMPLEXITY: Low     GOALS: Goals reviewed with patient? Yes   SHORT TERM GOALS: Target date: 04/30/21   Pt will be ind with initial HEP Baseline:  Goal status: met     LONG TERM GOALS: Target date: 06/26/21   Pt will be independent with advanced HEP to maintain improvements made throughout therapy   Baseline: cont to work on core and hip strength for final HEP in order to do without pain 05/30/21  Goal status: ongiong   2.  Pt will report 3 BMs per week due to improved muscle tone and coordination with BMs.  Baseline:  Goal status: met   3.  Pt will report her BMs are complete due to improved bowel habits and evacuation techniques.  Baseline:  Goal status: met       PLAN: PT  FREQUENCY: 1x/week   PT DURATION: 12 weeks   PLANNED INTERVENTIONS: Therapeutic exercises, Therapeutic activity, Neuromuscular re-education, Balance training, Gait training, Patient/Family education, Joint mobilization, Dry Needling, Electrical stimulation, Cryotherapy, Moist heat, Taping, Biofeedback, and Manual therapy   PLAN FOR NEXT SESSION: f/u on DN#1 to gluteals, core strength and functional lifting, woodpeckers, leg press   Camillo Flaming Shad Ledvina, PT 06/06/2021, 2:59 PM   PHYSICAL THERAPY DISCHARGE SUMMARY  Visits from Start of Care: 6  Current functional level related to goals / functional outcomes:   See above goals Remaining deficits: See above   Education / Equipment: HEP   Patient agrees to discharge. Patient goals were met. Patient is being discharged due to meeting the stated rehab goals.  Gustavus Bryant, PT 06/06/21 3:38 PM

## 2021-06-20 ENCOUNTER — Other Ambulatory Visit: Payer: Self-pay

## 2021-06-20 ENCOUNTER — Inpatient Hospital Stay: Payer: Medicare Other | Attending: Oncology

## 2021-06-20 ENCOUNTER — Inpatient Hospital Stay: Payer: Medicare Other

## 2021-06-20 DIAGNOSIS — N041 Nephrotic syndrome with focal and segmental glomerular lesions: Secondary | ICD-10-CM | POA: Insufficient documentation

## 2021-06-20 DIAGNOSIS — D631 Anemia in chronic kidney disease: Secondary | ICD-10-CM | POA: Diagnosis not present

## 2021-06-20 DIAGNOSIS — N19 Unspecified kidney failure: Secondary | ICD-10-CM | POA: Insufficient documentation

## 2021-06-20 DIAGNOSIS — N189 Chronic kidney disease, unspecified: Secondary | ICD-10-CM | POA: Diagnosis not present

## 2021-06-20 DIAGNOSIS — D6489 Other specified anemias: Secondary | ICD-10-CM | POA: Insufficient documentation

## 2021-06-20 DIAGNOSIS — D472 Monoclonal gammopathy: Secondary | ICD-10-CM | POA: Insufficient documentation

## 2021-06-20 LAB — CBC WITH DIFFERENTIAL (CANCER CENTER ONLY)
Abs Immature Granulocytes: 0.02 10*3/uL (ref 0.00–0.07)
Basophils Absolute: 0 10*3/uL (ref 0.0–0.1)
Basophils Relative: 0 %
Eosinophils Absolute: 0.2 10*3/uL (ref 0.0–0.5)
Eosinophils Relative: 2 %
HCT: 36.4 % (ref 36.0–46.0)
Hemoglobin: 11.5 g/dL — ABNORMAL LOW (ref 12.0–15.0)
Immature Granulocytes: 0 %
Lymphocytes Relative: 32 %
Lymphs Abs: 2.5 10*3/uL (ref 0.7–4.0)
MCH: 28.5 pg (ref 26.0–34.0)
MCHC: 31.6 g/dL (ref 30.0–36.0)
MCV: 90.1 fL (ref 80.0–100.0)
Monocytes Absolute: 0.7 10*3/uL (ref 0.1–1.0)
Monocytes Relative: 9 %
Neutro Abs: 4.4 10*3/uL (ref 1.7–7.7)
Neutrophils Relative %: 57 %
Platelet Count: 192 10*3/uL (ref 150–400)
RBC: 4.04 MIL/uL (ref 3.87–5.11)
RDW: 15 % (ref 11.5–15.5)
WBC Count: 7.8 10*3/uL (ref 4.0–10.5)
nRBC: 0 % (ref 0.0–0.2)

## 2021-07-02 ENCOUNTER — Encounter: Payer: Self-pay | Admitting: Emergency Medicine

## 2021-07-02 ENCOUNTER — Ambulatory Visit
Admission: EM | Admit: 2021-07-02 | Discharge: 2021-07-02 | Disposition: A | Discharge status: Home / Self Care | Payer: Medicare Other | Attending: Urgent Care | Admitting: Urgent Care

## 2021-07-02 DIAGNOSIS — W57XXXA Bitten or stung by nonvenomous insect and other nonvenomous arthropods, initial encounter: Secondary | ICD-10-CM

## 2021-07-02 DIAGNOSIS — L259 Unspecified contact dermatitis, unspecified cause: Secondary | ICD-10-CM

## 2021-07-02 MED ORDER — DOXYCYCLINE HYCLATE 100 MG PO CAPS: 200.0000 mg | ORAL_CAPSULE | Freq: Once | ORAL | 0 refills | Status: AC

## 2021-07-02 MED ORDER — TRIAMCINOLONE ACETONIDE 0.1 % EX CREA: 1.0000 | TOPICAL_CREAM | Freq: Two times a day (BID) | CUTANEOUS | 0 refills | Status: AC

## 2021-07-02 NOTE — ED Provider Notes (Signed)
[facility]   MRN: [id number] DOB: 05-08-1945  Subjective:   Jo [name] is a 76 y.o. female presenting for suffering 2 separate tick bites to the right lower leg and the right lateral hip a week ago.  That same day she had headaches, nausea and diarrhea.  Resolved after that.  She has not had any other symptoms including fever, chest pain, sore throat, headache, belly pain, cough.  Has not used any measures for her tick bites.  Now they are just very itchy.  No current facility-administered medications for this encounter.  Current Outpatient Medications:    acetaminophen (TYLENOL) 325 MG tablet, Take 325 mg by mouth every 6 (six) hours as needed for moderate pain., Disp: , Rfl:    aspirin EC 81 MG tablet, Take 81 mg by mouth at bedtime., Disp: , Rfl:    Cholecalciferol (DIALYVITE VITAMIN D 5000) 125 MCG (5000 UT) capsule, Take 5,000 Units by mouth at bedtime., Disp: , Rfl:    Evolocumab (REPATHA SURECLICK) 850 MG/ML SOAJ, Inject 140 mg into the skin every 14 (fourteen) days., Disp: 2 mL, Rfl: 11   FARXIGA 10 MG TABS tablet, Take 10 mg by mouth daily., Disp: , Rfl:    Ferrous Sulfate (IRON SLOW RELEASE PO), Take 1 tablet by mouth daily., Disp: , Rfl:    lisinopril (ZESTRIL) 10 MG tablet, Take 10 mg by mouth daily., Disp: , Rfl:    ondansetron (ZOFRAN) 4 MG tablet, Take 1 tablet (4 mg total) by mouth every 8 (eight) hours as needed for nausea or vomiting., Disp: 10 tablet, Rfl: 1   polyethylene glycol (MIRALAX / GLYCOLAX) packet, Take 17 g by mouth daily as needed (constipation). Mix in 8 oz liquid and drink, Disp: , Rfl:    Probiotic Product (DIGESTIVE ADVANTAGE) CAPS, Take 1 capsule by mouth daily., Disp: , Rfl:    vitamin B-12 (CYANOCOBALAMIN) 1000 MCG tablet, Take 1,000 mcg by mouth at bedtime. , Disp: , Rfl:    Allergies  Allergen Reactions   Demerol Nausea And Vomiting    Severe nausea and vomiting   Crestor [Rosuvastatin]     Fatigue. Sob, myalgias,  restless legs   Lipitor [Atorvastatin]     myalgias    Past Medical History:  Diagnosis Date   Allergy    Anemia    Arthritis    right knee   Carotid bruit 10/11/2019   Carotid stenosis 10/11/2019   Cataract    forming both   Chronic kidney disease    GERD (gastroesophageal reflux disease)    past hx   Goiter    Hyperlipidemia    Hypertension    Lower extremity edema 10/11/2019   Menopause age 41   Pure hypercholesterolemia 10/11/2019   Reflux    Shortness of breath 12/21/2020   Spinal stenosis      Past Surgical History:  Procedure Laterality Date   ANAL RECTAL MANOMETRY N/A 03/14/2021   Procedure: ANO RECTAL MANOMETRY;  Surgeon: Thornton Park, MD;  Location: [facility];  Service: Gastroenterology;  Laterality: N/A;   CERVICAL SPINE SURGERY  06/15/2010   COLONOSCOPY  2005   normal   LEFT HEART CATH AND CORONARY ANGIOGRAPHY N/A 09/16/2017   Procedure: LEFT HEART CATH AND CORONARY ANGIOGRAPHY;  Surgeon: Dixie Dials, MD;  Location: [facility];  Service: Cardiovascular;  Laterality: N/A;    Family History  Problem Relation Age of Onset   Stroke Mother    Hypertension Mother  Heart disease Mother    Heart disease Father    Heart attack Father    Cancer Sister    Breast cancer Sister    CAD Sister    Cancer Sister    CAD Sister    Lung cancer Sister    Cancer Brother    Heart disease Brother    Lung cancer Brother    Hypertension Daughter    Hypertension Other    Cancer Other        lung x 2   Colon cancer Neg Hx    Colon polyps Neg Hx    Esophageal cancer Neg Hx    Rectal cancer Neg Hx    Stomach cancer Neg Hx     Social History   Tobacco Use   Smoking status: Never   Smokeless tobacco: Never  Substance Use Topics   Alcohol use: No    Comment: glass of wine-special occasions   Drug use: No    ROS   Objective:   Vitals: BP 112/72   Pulse 62   Temp 97.7 F (36.5 C)   Resp 20   SpO2 100%   Physical  Exam Constitutional:      General: She is not in acute distress.    Appearance: Normal appearance. She is well-developed. She is not ill-appearing, toxic-appearing or diaphoretic.  HENT:     Head: Normocephalic and atraumatic.     Nose: Nose normal.     Mouth/Throat:     Mouth: Mucous membranes are moist.  Eyes:     General: No scleral icterus.       Right eye: No discharge.        Left eye: No discharge.     Extraocular Movements: Extraocular movements intact.  Cardiovascular:     Rate and Rhythm: Normal rate.  Pulmonary:     Effort: Pulmonary effort is normal.  Skin:    General: Skin is warm and dry.     Findings: Rash (2 separate annular lesions that are erythematic with a central resolving puncture wound over the right lateral lower leg and right lateral hip) present.     Comments: No drainage, tenderness, fluctuance, induration.  Neurological:     General: No focal deficit present.     Mental Status: She is alert and oriented to person, place, and time.  Psychiatric:        Mood and Affect: Mood normal.        Behavior: Behavior normal.     Assessment and Plan :   PDMP not reviewed this encounter.  1. Contact dermatitis, unspecified contact dermatitis type, unspecified trigger   2. Tick bite, unspecified site, initial encounter    We will manage for contact dermatitis with triamcinolone cream secondary to tick bites and symptoms.  For extra measure, we will do 200 mg doxycycline once given the tick bites. Counseled patient on potential for adverse effects with medications prescribed/recommended today, ER and return-to-clinic precautions discussed, patient verbalized understanding.    Jaynee Eagles, [place] 07/02/21 [phone]

## 2021-07-02 NOTE — ED Triage Notes (Signed)
Pt here with 2 tick bites from a week ago. On on right hip and one on right calf. They are both itchy and irritated.

## 2021-07-04 ENCOUNTER — Telehealth: Payer: Self-pay | Admitting: Emergency Medicine

## 2021-07-04 NOTE — Telephone Encounter (Signed)
LVM for pt to rtn my call to [phone] to schedule AWV with [facility]  

## 2021-07-18 ENCOUNTER — Inpatient Hospital Stay: Payer: Medicare Other

## 2021-07-18 ENCOUNTER — Inpatient Hospital Stay: Payer: Medicare Other | Attending: Oncology

## 2021-07-18 ENCOUNTER — Other Ambulatory Visit: Payer: Self-pay

## 2021-07-18 VITALS — BP 154/81 | HR 72 | Temp 99.1°F | Resp 18

## 2021-07-18 DIAGNOSIS — Z79899 Other long term (current) drug therapy: Secondary | ICD-10-CM | POA: Insufficient documentation

## 2021-07-18 DIAGNOSIS — D6489 Other specified anemias: Secondary | ICD-10-CM

## 2021-07-18 DIAGNOSIS — D472 Monoclonal gammopathy: Secondary | ICD-10-CM | POA: Diagnosis not present

## 2021-07-18 LAB — CBC WITH DIFFERENTIAL (CANCER CENTER ONLY)
Abs Immature Granulocytes: 0.03 10*3/uL (ref 0.00–0.07)
Basophils Absolute: 0 10*3/uL (ref 0.0–0.1)
Basophils Relative: 0 %
Eosinophils Absolute: 0.2 10*3/uL (ref 0.0–0.5)
Eosinophils Relative: 3 %
HCT: 31.8 % — ABNORMAL LOW (ref 36.0–46.0)
Hemoglobin: 10.3 g/dL — ABNORMAL LOW (ref 12.0–15.0)
Immature Granulocytes: 0 %
Lymphocytes Relative: 32 %
Lymphs Abs: 2.5 10*3/uL (ref 0.7–4.0)
MCH: 28.8 pg (ref 26.0–34.0)
MCHC: 32.4 g/dL (ref 30.0–36.0)
MCV: 88.8 fL (ref 80.0–100.0)
Monocytes Absolute: 0.8 10*3/uL (ref 0.1–1.0)
Monocytes Relative: 10 %
Neutro Abs: 4.4 10*3/uL (ref 1.7–7.7)
Neutrophils Relative %: 55 %
Platelet Count: 201 10*3/uL (ref 150–400)
RBC: 3.58 MIL/uL — ABNORMAL LOW (ref 3.87–5.11)
RDW: 15.2 % (ref 11.5–15.5)
WBC Count: 7.9 10*3/uL (ref 4.0–10.5)
nRBC: 0 % (ref 0.0–0.2)

## 2021-07-18 MED ORDER — DARBEPOETIN ALFA 300 MCG/0.6ML IJ SOSY
300.0000 ug | PREFILLED_SYRINGE | Freq: Once | INTRAMUSCULAR | Status: AC
  Administered 2021-07-18: 300 ug via SUBCUTANEOUS
  Filled 2021-07-18: qty 0.6

## 2021-08-15 ENCOUNTER — Other Ambulatory Visit: Payer: Self-pay

## 2021-08-15 ENCOUNTER — Inpatient Hospital Stay: Payer: Medicare Other | Attending: Oncology

## 2021-08-15 ENCOUNTER — Inpatient Hospital Stay: Payer: Medicare Other

## 2021-08-15 VITALS — BP 145/82 | HR 63 | Resp 18

## 2021-08-15 DIAGNOSIS — Z79899 Other long term (current) drug therapy: Secondary | ICD-10-CM | POA: Insufficient documentation

## 2021-08-15 DIAGNOSIS — D6489 Other specified anemias: Secondary | ICD-10-CM

## 2021-08-15 DIAGNOSIS — D472 Monoclonal gammopathy: Secondary | ICD-10-CM | POA: Diagnosis present

## 2021-08-15 LAB — CBC WITH DIFFERENTIAL (CANCER CENTER ONLY)
Abs Immature Granulocytes: 0.01 10*3/uL (ref 0.00–0.07)
Basophils Absolute: 0 10*3/uL (ref 0.0–0.1)
Basophils Relative: 0 %
Eosinophils Absolute: 0.2 10*3/uL (ref 0.0–0.5)
Eosinophils Relative: 2 %
HCT: 38.1 % (ref 36.0–46.0)
Hemoglobin: 12 g/dL (ref 12.0–15.0)
Immature Granulocytes: 0 %
Lymphocytes Relative: 31 %
Lymphs Abs: 2.3 10*3/uL (ref 0.7–4.0)
MCH: 29.6 pg (ref 26.0–34.0)
MCHC: 31.5 g/dL (ref 30.0–36.0)
MCV: 93.8 fL (ref 80.0–100.0)
Monocytes Absolute: 0.8 10*3/uL (ref 0.1–1.0)
Monocytes Relative: 11 %
Neutro Abs: 4.1 10*3/uL (ref 1.7–7.7)
Neutrophils Relative %: 56 %
Platelet Count: 242 10*3/uL (ref 150–400)
RBC: 4.06 MIL/uL (ref 3.87–5.11)
RDW: 14.6 % (ref 11.5–15.5)
WBC Count: 7.4 10*3/uL (ref 4.0–10.5)
nRBC: 0 % (ref 0.0–0.2)

## 2021-08-15 LAB — CMP (CANCER CENTER ONLY)
ALT: 9 U/L (ref 0–44)
AST: 15 U/L (ref 15–41)
Albumin: 3.9 g/dL (ref 3.5–5.0)
Alkaline Phosphatase: 66 U/L (ref 38–126)
Anion gap: 3 — ABNORMAL LOW (ref 5–15)
BUN: 36 mg/dL — ABNORMAL HIGH (ref 8–23)
CO2: 31 mmol/L (ref 22–32)
Calcium: 9.4 mg/dL (ref 8.9–10.3)
Chloride: 105 mmol/L (ref 98–111)
Creatinine: 1.31 mg/dL — ABNORMAL HIGH (ref 0.44–1.00)
GFR, Estimated: 42 mL/min — ABNORMAL LOW (ref >60)
Glucose, Bld: 88 mg/dL (ref 70–99)
Potassium: 4.2 mmol/L (ref 3.5–5.1)
Sodium: 139 mmol/L (ref 135–145)
Total Bilirubin: 0.3 mg/dL (ref 0.3–1.2)
Total Protein: 7.1 g/dL (ref 6.5–8.1)

## 2021-08-15 MED ORDER — DARBEPOETIN ALFA 300 MCG/0.6ML IJ SOSY: 300.0000 ug | PREFILLED_SYRINGE | Freq: Once | INTRAMUSCULAR | Status: DC

## 2021-08-15 NOTE — Addendum Note (Signed)
Addended by: Dicie Beam D on: 08/15/2021 01:48 PM   Modules accepted: Orders

## 2021-08-16 LAB — KAPPA/LAMBDA LIGHT CHAINS
Kappa free light chain: 49.1 mg/L — ABNORMAL HIGH (ref 3.3–19.4)
Kappa, lambda light chain ratio: 1.84 — ABNORMAL HIGH (ref 0.26–1.65)
Lambda free light chains: 26.7 mg/L — ABNORMAL HIGH (ref 5.7–26.3)

## 2021-08-20 LAB — MULTIPLE MYELOMA PANEL, SERUM
Albumin SerPl Elph-Mcnc: 3.2 g/dL (ref 2.9–4.4)
Albumin/Glob SerPl: 1 (ref 0.7–1.7)
Alpha 1: 0.2 g/dL (ref 0.0–0.4)
Alpha2 Glob SerPl Elph-Mcnc: 0.7 g/dL (ref 0.4–1.0)
B-Globulin SerPl Elph-Mcnc: 1 g/dL (ref 0.7–1.3)
Gamma Glob SerPl Elph-Mcnc: 1.5 g/dL (ref 0.4–1.8)
Globulin, Total: 3.4 g/dL (ref 2.2–3.9)
IgA: 205 mg/dL (ref 64–422)
IgG (Immunoglobin G), Serum: 1493 mg/dL (ref 586–1602)
IgM (Immunoglobulin M), Srm: 68 mg/dL (ref 26–217)
M Protein SerPl Elph-Mcnc: 0.4 g/dL — ABNORMAL HIGH
Total Protein ELP: 6.6 g/dL (ref 6.0–8.5)

## 2021-09-19 ENCOUNTER — Inpatient Hospital Stay: Payer: Medicare Other | Attending: Oncology

## 2021-09-19 ENCOUNTER — Inpatient Hospital Stay: Payer: Medicare Other

## 2021-09-19 DIAGNOSIS — D631 Anemia in chronic kidney disease: Secondary | ICD-10-CM | POA: Diagnosis not present

## 2021-09-19 DIAGNOSIS — N189 Chronic kidney disease, unspecified: Secondary | ICD-10-CM | POA: Diagnosis not present

## 2021-09-19 DIAGNOSIS — D472 Monoclonal gammopathy: Secondary | ICD-10-CM | POA: Insufficient documentation

## 2021-09-19 DIAGNOSIS — D6489 Other specified anemias: Secondary | ICD-10-CM

## 2021-09-19 LAB — CBC WITH DIFFERENTIAL (CANCER CENTER ONLY)
Abs Immature Granulocytes: 0.03 10*3/uL (ref 0.00–0.07)
Basophils Absolute: 0 10*3/uL (ref 0.0–0.1)
Basophils Relative: 0 %
Eosinophils Absolute: 0.1 10*3/uL (ref 0.0–0.5)
Eosinophils Relative: 2 %
HCT: 36.7 % (ref 36.0–46.0)
Hemoglobin: 11.9 g/dL — ABNORMAL LOW (ref 12.0–15.0)
Immature Granulocytes: 0 %
Lymphocytes Relative: 34 %
Lymphs Abs: 2.6 10*3/uL (ref 0.7–4.0)
MCH: 29.5 pg (ref 26.0–34.0)
MCHC: 32.4 g/dL (ref 30.0–36.0)
MCV: 91.1 fL (ref 80.0–100.0)
Monocytes Absolute: 0.7 10*3/uL (ref 0.1–1.0)
Monocytes Relative: 9 %
Neutro Abs: 4.2 10*3/uL (ref 1.7–7.7)
Neutrophils Relative %: 55 %
Platelet Count: 214 10*3/uL (ref 150–400)
RBC: 4.03 MIL/uL (ref 3.87–5.11)
RDW: 14 % (ref 11.5–15.5)
WBC Count: 7.7 10*3/uL (ref 4.0–10.5)
nRBC: 0 % (ref 0.0–0.2)

## 2021-09-19 NOTE — Progress Notes (Signed)
Pt came in for aranesp inj today- hgb 11.9- doesn't meet parameters to receive inj today. Gave pt lab results.

## 2021-10-17 ENCOUNTER — Inpatient Hospital Stay: Payer: Medicare Other | Attending: Oncology

## 2021-10-17 ENCOUNTER — Inpatient Hospital Stay: Payer: Medicare Other

## 2021-10-17 DIAGNOSIS — D6489 Other specified anemias: Secondary | ICD-10-CM

## 2021-10-17 DIAGNOSIS — D472 Monoclonal gammopathy: Secondary | ICD-10-CM | POA: Insufficient documentation

## 2021-10-17 LAB — CBC WITH DIFFERENTIAL (CANCER CENTER ONLY)
Abs Immature Granulocytes: 0.02 10*3/uL (ref 0.00–0.07)
Basophils Absolute: 0 10*3/uL (ref 0.0–0.1)
Basophils Relative: 0 %
Eosinophils Absolute: 0.2 10*3/uL (ref 0.0–0.5)
Eosinophils Relative: 2 %
HCT: 35.3 % — ABNORMAL LOW (ref 36.0–46.0)
Hemoglobin: 11.5 g/dL — ABNORMAL LOW (ref 12.0–15.0)
Immature Granulocytes: 0 %
Lymphocytes Relative: 30 %
Lymphs Abs: 2.1 10*3/uL (ref 0.7–4.0)
MCH: 29.8 pg (ref 26.0–34.0)
MCHC: 32.6 g/dL (ref 30.0–36.0)
MCV: 91.5 fL (ref 80.0–100.0)
Monocytes Absolute: 0.7 10*3/uL (ref 0.1–1.0)
Monocytes Relative: 10 %
Neutro Abs: 4 10*3/uL (ref 1.7–7.7)
Neutrophils Relative %: 58 %
Platelet Count: 202 10*3/uL (ref 150–400)
RBC: 3.86 MIL/uL — ABNORMAL LOW (ref 3.87–5.11)
RDW: 13.5 % (ref 11.5–15.5)
WBC Count: 7.1 10*3/uL (ref 4.0–10.5)
nRBC: 0 % (ref 0.0–0.2)

## 2021-10-17 NOTE — Progress Notes (Signed)
Patient is here for Aranesp. Her Hgb is 11.5 therefore she does not meet the parameters to receive this injection today. Labs printed and patient verbalized understanding.

## 2021-11-05 ENCOUNTER — Ambulatory Visit (INDEPENDENT_AMBULATORY_CARE_PROVIDER_SITE_OTHER): Payer: Medicare Other | Admitting: Emergency Medicine

## 2021-11-05 ENCOUNTER — Encounter: Payer: Self-pay | Admitting: Emergency Medicine

## 2021-11-05 VITALS — BP 132/74 | HR 65 | Temp 98.5°F | Ht 66.0 in | Wt 169.1 lb

## 2021-11-05 DIAGNOSIS — Z23 Encounter for immunization: Secondary | ICD-10-CM

## 2021-11-05 DIAGNOSIS — I10 Essential (primary) hypertension: Secondary | ICD-10-CM

## 2021-11-05 DIAGNOSIS — Z0000 Encounter for general adult medical examination without abnormal findings: Secondary | ICD-10-CM | POA: Diagnosis not present

## 2021-11-05 DIAGNOSIS — R7303 Prediabetes: Secondary | ICD-10-CM

## 2021-11-05 DIAGNOSIS — I6523 Occlusion and stenosis of bilateral carotid arteries: Secondary | ICD-10-CM

## 2021-11-05 DIAGNOSIS — N1832 Chronic kidney disease, stage 3b: Secondary | ICD-10-CM

## 2021-11-05 DIAGNOSIS — E785 Hyperlipidemia, unspecified: Secondary | ICD-10-CM | POA: Diagnosis not present

## 2021-11-05 NOTE — Assessment & Plan Note (Signed)
Advised to stay well-hydrated and avoid NSAIDs. Continue Farxiga 10 mg daily. 

## 2021-11-05 NOTE — Assessment & Plan Note (Signed)
Diet and nutrition discussed. 

## 2021-11-05 NOTE — Assessment & Plan Note (Signed)
Continue daily baby aspirin

## 2021-11-05 NOTE — Patient Instructions (Signed)

## 2021-11-05 NOTE — Progress Notes (Signed)
Jo [name] 76 y.o.   Chief Complaint  Patient presents with   Annual Exam    No concerns     HISTORY OF PRESENT ILLNESS: This is a 76 y.o. female here for physical. Overall doing well.  Has no complaints or medical concerns today.  HPI   Prior to Admission medications   Medication Sig Start Date End Date Taking? Authorizing Provider  acetaminophen (TYLENOL) 325 MG tablet Take 325 mg by mouth every 6 (six) hours as needed for moderate pain.    [provider]  aspirin EC 81 MG tablet Take 81 mg by mouth at bedtime.    [provider]  Cholecalciferol (DIALYVITE VITAMIN D 5000) 125 MCG (5000 UT) capsule Take 5,000 Units by mouth at bedtime.    [provider]  Evolocumab (REPATHA SURECLICK) 161 MG/ML SOAJ Inject 140 mg into the skin every 14 (fourteen) days. 01/01/21   Skeet Latch, MD  FARXIGA 10 MG TABS tablet Take 10 mg by mouth daily. 02/15/21   [provider]  Ferrous Sulfate (IRON SLOW RELEASE PO) Take 1 tablet by mouth daily.    [provider]  lisinopril (ZESTRIL) 10 MG tablet Take 10 mg by mouth daily. 02/15/21   [provider]  ondansetron (ZOFRAN) 4 MG tablet Take 1 tablet (4 mg total) by mouth every 8 (eight) hours as needed for nausea or vomiting. 01/08/21   Cirigliano, Vito V, DO  polyethylene glycol (MIRALAX / GLYCOLAX) packet Take 17 g by mouth daily as needed (constipation). Mix in 8 oz liquid and drink    [provider]  Probiotic Product (DIGESTIVE ADVANTAGE) CAPS Take 1 capsule by mouth daily.    [provider]  triamcinolone cream (KENALOG) 0.1 % Apply 1 Application topically 2 (two) times daily. 07/02/21   Jaynee Eagles, PA-C  vitamin B-12 (CYANOCOBALAMIN) 1000 MCG tablet Take 1,000 mcg by mouth at bedtime.     [provider]    Allergies  Allergen Reactions   Demerol Nausea And Vomiting    Severe nausea and vomiting   Crestor [Rosuvastatin]     Fatigue. 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    Other specified anemias 01/30/2021   MGUS (monoclonal gammopathy of unknown significance) 10/18/2020   Unilateral primary osteoarthritis, right knee 05/24/2020   Stage 3a chronic kidney disease ([place]) 10/26/2019   Pure hypercholesterolemia 10/11/2019   Carotid stenosis 10/11/2019   Impaired fasting glucose 12/18/2016   Pre-diabetes 10/25/2014   Essential hypertension    Mild anemia     Past Medical History:  Diagnosis Date   Allergy    Anemia    Arthritis    right knee   Carotid bruit 10/11/2019   Carotid stenosis 10/11/2019   Cataract    forming both   Chronic kidney disease    GERD (gastroesophageal reflux disease)    past hx   Goiter    Hyperlipidemia    Hypertension    Lower extremity edema 10/11/2019   Menopause age 59   Pure hypercholesterolemia 10/11/2019   Reflux    Shortness of breath 12/21/2020   Spinal stenosis     Past Surgical History:  Procedure Laterality Date   ANAL RECTAL MANOMETRY N/A 03/14/2021   Procedure: ANO RECTAL MANOMETRY;  Surgeon: Thornton Park, MD;  Location: [facility];  Service: Gastroenterology;  Laterality: N/A;   CERVICAL SPINE SURGERY  06/15/2010   COLONOSCOPY  2005   normal   LEFT HEART CATH AND CORONARY ANGIOGRAPHY N/A 09/16/2017   Procedure: LEFT HEART CATH AND CORONARY ANGIOGRAPHY;  Surgeon: Dixie Dials, MD;  Location: [facility];  Service: Cardiovascular;  Laterality: N/A;    Social History   Socioeconomic History   Marital status: Widowed    Spouse name: Not on file   Number of children: 3   Years of education: college   Highest education level: Not on file  Occupational History   Occupation: CSST    Employer: [employer]  Tobacco Use   Smoking status: Never   Smokeless tobacco: Never  Substance and Sexual Activity   Alcohol use: No     Comment: glass of wine-special occasions   Drug use: No   Sexual activity: Never    Birth control/protection: Abstinence  Other Topics Concern   Not on file  Social History Narrative   Not on file   Social Determinants of Health   Financial Resource Strain: Not on file  Food Insecurity: Not on file  Transportation Needs: Not on file  Physical Activity: Not on file  Stress: Not on file  Social Connections: Not on file  Intimate Partner Violence: Not on file    Family History  Problem Relation Age of Onset   Stroke Mother    Hypertension Mother    Heart disease Mother    Heart disease Father    Heart attack Father    Cancer Sister    Breast cancer Sister    CAD Sister    Cancer Sister    CAD Sister    Lung cancer Sister    Cancer Brother    Heart disease Brother    Lung cancer Brother    Hypertension Daughter    Hypertension Other    Cancer Other        lung x 2   Colon cancer Neg Hx    Colon polyps Neg Hx    Esophageal cancer Neg Hx    Rectal cancer Neg Hx    Stomach cancer Neg Hx      Review of Systems  Constitutional: Negative.  Negative for chills and fever.  HENT: Negative.  Negative for congestion and sore throat.   Respiratory: Negative.  Negative for cough and shortness of breath.   Cardiovascular: Negative.  Negative for chest pain and palpitations.  Gastrointestinal:  Negative for abdominal pain, diarrhea, nausea and vomiting.  Genitourinary: Negative.  Negative for dysuria and hematuria.  Skin: Negative.  Negative for rash.  Neurological: Negative.  Negative for dizziness and headaches.  All other systems reviewed and are negative.  Today's Vitals   11/05/21 1303  BP: 132/74  Pulse: 65  Temp: 98.5 F (36.9 C)  TempSrc: Oral  SpO2: 97%  Weight: 169 lb 2 oz (76.7 kg)  Height: '5\' 6"'$  (1.676 m)   Body mass index is 27.3 kg/m. Wt Readings from Last 3 Encounters:  11/05/21 169 lb 2 oz (76.7 kg)  05/22/21 165 lb 8 oz (75.1 kg)  04/18/21  165 lb 3.2 oz (74.9 kg)     Physical Exam Vitals reviewed.  Constitutional:      Appearance: Normal appearance.  HENT:     Head: Normocephalic.     Right Ear: Tympanic membrane, ear canal and external ear normal.     Left Ear: Tympanic membrane, ear canal and external ear normal.  Mouth/Throat:     Mouth: Mucous membranes are moist.     Pharynx: Oropharynx is clear.  Eyes:     Extraocular Movements: Extraocular movements intact.     Conjunctiva/sclera: Conjunctivae normal.     Pupils: Pupils are equal, round, and reactive to light.  Neck:     Vascular: No carotid bruit.  Cardiovascular:     Rate and Rhythm: Normal rate and regular rhythm.     Pulses: Normal pulses.     Heart sounds: Normal heart sounds.  Pulmonary:     Effort: Pulmonary effort is normal.     Breath sounds: Normal breath sounds.  Abdominal:     Palpations: Abdomen is soft.     Tenderness: There is no abdominal tenderness.  Musculoskeletal:        General: Normal range of motion.     Cervical back: No tenderness.  Lymphadenopathy:     Cervical: No cervical adenopathy.  Skin:    General: Skin is warm and dry.  Neurological:     General: No focal deficit present.     Mental Status: She is alert and oriented to person, place, and time.  Psychiatric:        Mood and Affect: Mood normal.        Behavior: Behavior normal.      ASSESSMENT & PLAN: Problem List Items Addressed This Visit       Cardiovascular and Mediastinum   Essential hypertension    Well-controlled hypertension. Continue lisinopril 10 mg      Carotid stenosis    Continue daily baby aspirin        Genitourinary   Stage 3b chronic kidney disease ([place])    Advised to stay well-hydrated and avoid NSAIDs Continue Farxiga 10 mg daily        Other   Pre-diabetes    Diet and nutrition discussed      Dyslipidemia    Continue Repatha injections      Other Visit Diagnoses     Routine general medical examination at a  health care facility    -  Primary   Need for vaccination       Relevant Orders   Flu Vaccine QUAD High Dose(Fluad)      Modifiable risk factors discussed with patient. Anticipatory guidance according to age provided. The following topics were also discussed: Social Determinants of Health Smoking.  Non-smoker Diet and nutrition.  Good eating habits Benefits of exercise.  Walks daily Cancer screening and review of most recent colonoscopy and mammogram reports Vaccinations reviewed and recommendations Cardiovascular risk assessment Review of recent blood work results Review of multiple chronic medical problems and their management Review of medications Mental health including depression and anxiety Fall and accident prevention  Patient Instructions  Health Maintenance, Female Adopting a healthy lifestyle and getting preventive care are important in promoting health and wellness. Ask your health care provider about: The right schedule for you to have regular tests and exams. Things you can do on your own to prevent diseases and keep yourself healthy. What should I know about diet, weight, and exercise? Eat a healthy diet  Eat a diet that includes plenty of vegetables, fruits, low-fat dairy products, and lean protein. Do not eat a lot of foods that are high in solid fats, added sugars, or sodium. Maintain a healthy weight Body mass index (BMI) is used to identify weight problems. It estimates body fat based on height and weight. Your health care provider can help determine  your BMI and help you achieve or maintain a healthy weight. Get regular exercise Get regular exercise. This is one of the most important things you can do for your health. Most adults should: Exercise for at least 150 minutes each week. The exercise should increase your heart rate and make you sweat (moderate-intensity exercise). Do strengthening exercises at least twice a week. This is in addition to the  moderate-intensity exercise. Spend less time sitting. Even light physical activity can be beneficial. Watch cholesterol and blood lipids Have your blood tested for lipids and cholesterol at 77 years of age, then have this test every 5 years. Have your cholesterol levels checked more often if: Your lipid or cholesterol levels are high. You are older than 77 years of age. You are at high risk for heart disease. What should I know about cancer screening? Depending on your health history and family history, you may need to have cancer screening at various ages. This may include screening for: Breast cancer. Cervical cancer. Colorectal cancer. Skin cancer. Lung cancer. What should I know about heart disease, diabetes, and high blood pressure? Blood pressure and heart disease High blood pressure causes heart disease and increases the risk of stroke. This is more likely to develop in people who have high blood pressure readings or are overweight. Have your blood pressure checked: Every 3-5 years if you are 64-24 years of age. Every year if you are 72 years old or older. Diabetes Have regular diabetes screenings. This checks your fasting blood sugar level. Have the screening done: Once every three years after age 52 if you are at a normal weight and have a low risk for diabetes. More often and at a younger age if you are overweight or have a high risk for diabetes. What should I know about preventing infection? Hepatitis B If you have a higher risk for hepatitis B, you should be screened for this virus. Talk with your health care provider to find out if you are at risk for hepatitis B infection. Hepatitis C Testing is recommended for: Everyone born from [birth year] through 1966. Anyone with known risk factors for hepatitis C. Sexually transmitted infections (STIs) Get screened for STIs, including gonorrhea and chlamydia, if: You are sexually active and are younger than 77 years of age. You are  older than 77 years of age and your health care provider tells you that you are at risk for this type of infection. Your sexual activity has changed since you were last screened, and you are at increased risk for chlamydia or gonorrhea. Ask your health care provider if you are at risk. Ask your health care provider about whether you are at high risk for HIV. Your health care provider may recommend a prescription medicine to help prevent HIV infection. If you choose to take medicine to prevent HIV, you should first get tested for HIV. You should then be tested every 3 months for as long as you are taking the medicine. Pregnancy If you are about to stop having your period (premenopausal) and you may become pregnant, seek counseling before you get pregnant. Take 400 to 800 micrograms (mcg) of folic acid every day if you become pregnant. Ask for birth control (contraception) if you want to prevent pregnancy. Osteoporosis and menopause Osteoporosis is a disease in which the bones lose minerals and strength with aging. This can result in bone fractures. If you are 77 years old or older, or if you are at risk for osteoporosis and fractures, ask  your health care provider if you should: Be screened for bone loss. Take a calcium or vitamin D supplement to lower your risk of fractures. Be given hormone replacement therapy (HRT) to treat symptoms of menopause. Follow these instructions at home: Alcohol use Do not drink alcohol if: Your health care provider tells you not to drink. You are pregnant, may be pregnant, or are planning to become pregnant. If you drink alcohol: Limit how much you have to: 0-1 drink a day. Know how much alcohol is in your drink. In the [place] one drink equals one 12 oz bottle of beer (355 mL), one 5 oz glass of wine (148 mL), or one 1 oz glass of hard liquor (44 mL). Lifestyle Do not use any products that contain nicotine or tobacco. These products include cigarettes, chewing  tobacco, and vaping devices, such as e-cigarettes. If you need help quitting, ask your health care provider. Do not use street drugs. Do not share needles. Ask your health care provider for help if you need support or information about quitting drugs. General instructions Schedule regular health, dental, and eye exams. Stay current with your vaccines. Tell your health care provider if: You often feel depressed. You have ever been abused or do not feel safe at home. Summary Adopting a healthy lifestyle and getting preventive care are important in promoting health and wellness. Follow your health care provider's instructions about healthy diet, exercising, and getting tested or screened for diseases. Follow your health care provider's instructions on monitoring your cholesterol and blood pressure. This information is not intended to replace advice given to you by your health care provider. Make sure you discuss any questions you have with your health care provider. Document Revised: 05/22/2020 Document Reviewed: 05/22/2020 Elsevier Patient Education  [place], MD [facility]

## 2021-11-05 NOTE — Assessment & Plan Note (Signed)
Well-controlled hypertension. Continue lisinopril 10 mg

## 2021-11-05 NOTE — Assessment & Plan Note (Signed)
Continue Repatha injections

## 2021-11-14 ENCOUNTER — Inpatient Hospital Stay: Payer: Medicare Other | Attending: Oncology

## 2021-11-14 ENCOUNTER — Inpatient Hospital Stay: Payer: Medicare Other

## 2021-11-14 ENCOUNTER — Other Ambulatory Visit: Payer: Self-pay

## 2021-11-14 DIAGNOSIS — D472 Monoclonal gammopathy: Secondary | ICD-10-CM | POA: Diagnosis present

## 2021-11-14 DIAGNOSIS — D6489 Other specified anemias: Secondary | ICD-10-CM

## 2021-11-14 LAB — CBC WITH DIFFERENTIAL (CANCER CENTER ONLY)
Abs Immature Granulocytes: 0.03 10*3/uL (ref 0.00–0.07)
Basophils Absolute: 0 10*3/uL (ref 0.0–0.1)
Basophils Relative: 0 %
Eosinophils Absolute: 0.2 10*3/uL (ref 0.0–0.5)
Eosinophils Relative: 2 %
HCT: 37 % (ref 36.0–46.0)
Hemoglobin: 12 g/dL (ref 12.0–15.0)
Immature Granulocytes: 0 %
Lymphocytes Relative: 34 %
Lymphs Abs: 2.7 10*3/uL (ref 0.7–4.0)
MCH: 29.8 pg (ref 26.0–34.0)
MCHC: 32.4 g/dL (ref 30.0–36.0)
MCV: 91.8 fL (ref 80.0–100.0)
Monocytes Absolute: 0.8 10*3/uL (ref 0.1–1.0)
Monocytes Relative: 10 %
Neutro Abs: 4.1 10*3/uL (ref 1.7–7.7)
Neutrophils Relative %: 54 %
Platelet Count: 220 10*3/uL (ref 150–400)
RBC: 4.03 MIL/uL (ref 3.87–5.11)
RDW: 13.6 % (ref 11.5–15.5)
WBC Count: 7.8 10*3/uL (ref 4.0–10.5)
nRBC: 0 % (ref 0.0–0.2)

## 2021-11-14 NOTE — Progress Notes (Signed)
Pt is here for Aranesp. Hgb is 12.0 therefore she does not meet the parameters to receive this injection today. Labs printed and given.

## 2021-12-11 ENCOUNTER — Other Ambulatory Visit: Payer: Self-pay | Admitting: Cardiovascular Disease

## 2021-12-11 DIAGNOSIS — E7800 Pure hypercholesterolemia, unspecified: Secondary | ICD-10-CM

## 2021-12-11 DIAGNOSIS — E785 Hyperlipidemia, unspecified: Secondary | ICD-10-CM

## 2021-12-11 DIAGNOSIS — I6523 Occlusion and stenosis of bilateral carotid arteries: Secondary | ICD-10-CM

## 2021-12-11 DIAGNOSIS — I6521 Occlusion and stenosis of right carotid artery: Secondary | ICD-10-CM

## 2021-12-11 NOTE — Telephone Encounter (Signed)
Please review for refill. Thank you! 

## 2021-12-19 ENCOUNTER — Inpatient Hospital Stay: Payer: Medicare Other

## 2021-12-19 ENCOUNTER — Inpatient Hospital Stay: Payer: Medicare Other | Admitting: Oncology

## 2021-12-27 ENCOUNTER — Inpatient Hospital Stay (HOSPITAL_BASED_OUTPATIENT_CLINIC_OR_DEPARTMENT_OTHER): Payer: Medicare Other | Admitting: Oncology

## 2021-12-27 ENCOUNTER — Other Ambulatory Visit: Payer: Self-pay

## 2021-12-27 ENCOUNTER — Inpatient Hospital Stay: Payer: Medicare Other

## 2021-12-27 ENCOUNTER — Inpatient Hospital Stay: Payer: Medicare Other | Attending: Oncology

## 2021-12-27 VITALS — BP 124/69 | HR 73 | Temp 97.8°F | Resp 18 | Ht 66.0 in | Wt 170.0 lb

## 2021-12-27 DIAGNOSIS — Z7982 Long term (current) use of aspirin: Secondary | ICD-10-CM | POA: Diagnosis not present

## 2021-12-27 DIAGNOSIS — Z7984 Long term (current) use of oral hypoglycemic drugs: Secondary | ICD-10-CM | POA: Diagnosis not present

## 2021-12-27 DIAGNOSIS — N189 Chronic kidney disease, unspecified: Secondary | ICD-10-CM | POA: Diagnosis not present

## 2021-12-27 DIAGNOSIS — D6489 Other specified anemias: Secondary | ICD-10-CM

## 2021-12-27 DIAGNOSIS — Z79899 Other long term (current) drug therapy: Secondary | ICD-10-CM | POA: Diagnosis not present

## 2021-12-27 DIAGNOSIS — D472 Monoclonal gammopathy: Secondary | ICD-10-CM | POA: Diagnosis present

## 2021-12-27 DIAGNOSIS — D631 Anemia in chronic kidney disease: Secondary | ICD-10-CM | POA: Insufficient documentation

## 2021-12-27 LAB — CBC WITH DIFFERENTIAL (CANCER CENTER ONLY)
Abs Immature Granulocytes: 0.02 10*3/uL (ref 0.00–0.07)
Basophils Absolute: 0 10*3/uL (ref 0.0–0.1)
Basophils Relative: 0 %
Eosinophils Absolute: 0.2 10*3/uL (ref 0.0–0.5)
Eosinophils Relative: 3 %
HCT: 33.4 % — ABNORMAL LOW (ref 36.0–46.0)
Hemoglobin: 11 g/dL — ABNORMAL LOW (ref 12.0–15.0)
Immature Granulocytes: 0 %
Lymphocytes Relative: 35 %
Lymphs Abs: 3 10*3/uL (ref 0.7–4.0)
MCH: 30.6 pg (ref 26.0–34.0)
MCHC: 32.9 g/dL (ref 30.0–36.0)
MCV: 93 fL (ref 80.0–100.0)
Monocytes Absolute: 0.9 10*3/uL (ref 0.1–1.0)
Monocytes Relative: 10 %
Neutro Abs: 4.5 10*3/uL (ref 1.7–7.7)
Neutrophils Relative %: 52 %
Platelet Count: 225 10*3/uL (ref 150–400)
RBC: 3.59 MIL/uL — ABNORMAL LOW (ref 3.87–5.11)
RDW: 14 % (ref 11.5–15.5)
WBC Count: 8.6 10*3/uL (ref 4.0–10.5)
nRBC: 0 % (ref 0.0–0.2)

## 2021-12-27 NOTE — Progress Notes (Addendum)
Hematology and Oncology Follow Up Visit  Jo [name] [id number] 08/23/1945 76 y.o. 12/27/2021 1:29 PM Wyatt Portela, MDSagardia, [facility], *   Principle Diagnosis: 75 year old woman with MGUS diagnosed in 2022.  She was found to have IgG kappa with M spike of 0.2 g/dL without evidence of endorgan damage to suggest multiple myeloma.  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   Current therapy: Aranesp 300 mcg every 3 weeks started on February 06, 2021.  Therapy discontinued due to normalization of her hemoglobin.  Interim History: Ms. Walker Kehr is here for return evaluation.  Since the last visit, she reports feeling well without any major complaints.  She denies any nausea, vomiting or abdominal pain.  She denies any hospitalizations or illnesses.  She did report lower back pain in the last week after minor injury.  She denies excessive fatigue or weakness or any constitutional symptoms.    Medications: Updated on review. Current Outpatient Medications  Medication Sig Dispense Refill   acetaminophen (TYLENOL) 325 MG tablet Take 325 mg by mouth every 6 (six) hours as needed for moderate pain.     aspirin EC 81 MG tablet Take 81 mg by mouth at bedtime.     Cholecalciferol (DIALYVITE VITAMIN D 5000) 125 MCG (5000 UT) capsule Take 5,000 Units by mouth at bedtime.     Evolocumab (REPATHA SURECLICK) 122 MG/ML SOAJ INJECT 1ML (140MG) INTO THE SKIN EVERY 14 DAYS 6 mL 1   FARXIGA 10 MG TABS tablet Take 10 mg by mouth daily.     Ferrous Sulfate (IRON SLOW RELEASE PO) Take 1 tablet by mouth daily.     lisinopril (ZESTRIL) 10 MG tablet Take 10 mg by mouth daily.     ondansetron (ZOFRAN) 4 MG tablet Take 1 tablet (4 mg total) by mouth every 8 (eight) hours as needed for nausea or vomiting. 10 tablet 1   polyethylene glycol (MIRALAX / GLYCOLAX) packet Take 17 g by mouth daily as needed (constipation). Mix in 8 oz liquid and drink     triamcinolone  cream (KENALOG) 0.1 % Apply 1 Application topically 2 (two) times daily. 30 g 0   vitamin B-12 (CYANOCOBALAMIN) 1000 MCG tablet Take 1,000 mcg by mouth at bedtime.      No current facility-administered medications for this visit.     Allergies:  Allergies  Allergen Reactions   Demerol Nausea And Vomiting    Severe nausea and vomiting   Crestor [Rosuvastatin]     Fatigue. Sob, myalgias, restless legs   Lipitor [Atorvastatin]     myalgias      Physical Exam:   Blood pressure 124/69, pulse 73, temperature 97.8 F (36.6 C), temperature source Temporal, resp. rate 18, height _0  (1.676 m), weight 170 lb (77.1 kg), SpO2 100 %.   ECOG: 1    General appearance: Comfortable appearing without any discomfort Head: Normocephalic without any trauma Oropharynx: Mucous membranes are moist and pink without any thrush or ulcers. Eyes: Pupils are equal and round reactive to light. Lymph nodes: No cervical, supraclavicular, inguinal or axillary lymphadenopathy.   Heart:regular rate and rhythm.  S1 and S2 without leg edema. Lung: Clear without any rhonchi or wheezes.  No dullness to percussion. Abdomin: Soft, nontender, nondistended with good bowel sounds.  No hepatosplenomegaly. Musculoskeletal: No joint deformity or effusion.  Full range of motion noted. Neurological: No deficits noted on motor, sensory and deep tendon reflex exam. Skin: No petechial rash or dryness.  Appeared moist.        Lab Results: Lab Results  Component Value Date   WBC 8.6 12/27/2021   HGB 11.0 (L) 12/27/2021   HCT 33.4 (L) 12/27/2021   MCV 93.0 12/27/2021   PLT 225 12/27/2021     Chemistry      Component Value Date/Time   NA 139 08/15/2021 1241   NA 141 05/15/2021 1032   K 4.2 08/15/2021 1241   CL 105 08/15/2021 1241   CO2 31 08/15/2021 1241   BUN 36 (H) 08/15/2021 1241   BUN 26 05/15/2021 1032   CREATININE 1.31 (H) 08/15/2021 1241   CREATININE 0.89 12/13/2015 1102   GLU 88 07/25/2010 0000       Component Value Date/Time   CALCIUM 9.4 08/15/2021 1241   ALKPHOS 66 08/15/2021 1241   AST 15 08/15/2021 1241   ALT 9 08/15/2021 1241   BILITOT 0.3 08/15/2021 1241        Impression and Plan:  76 year old woman with:  1.  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     The natural course of this disease and treatment options were discussed at this time.  Protein studies obtained in April 2023 were reviewed which showed a slight increase in her M spike of 0.4 g/dL.  Quantitative immunoglobulins are all within normal range with elevated kappa and lambda light chains.  The ratio is mildly elevated at 1.84.  These findings likely suggest MGUS rather than symptomatic multiple myeloma I have recommended continued active surveillance.  We will update his protein studies in the next 6 months.  Indication for treatment including endorgan damage such as worsening anemia, worsening renal function or bone involvement.     2.  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  Her hemoglobin continues to be above 11 with minimal intervention at this time.  Last Aranesp was given in July 2023.  At this time, we will hold off on any additional Aranesp injections unless her hemoglobin drops and these can be reinitiated.   3.  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  This is unrelated to her plasma cell disorder.     4.  Follow-up: She will return in 6 months for repeat follow-up.    30  minutes were spent on this encounter.  The time was dedicated to updating disease status, treatment choices and outlining future plan of care.    Zola Button, MD 12/14/20231:29 PM

## 2022-01-16 ENCOUNTER — Telehealth: Payer: Self-pay | Admitting: Oncology

## 2022-01-16 NOTE — Telephone Encounter (Signed)
Called patient regarding June appointment, informed patient regarding providers departure. Scheduled with new provider, left a voicemail.

## 2022-01-31 ENCOUNTER — Encounter: Payer: Self-pay | Admitting: Oncology

## 2022-01-31 ENCOUNTER — Other Ambulatory Visit (HOSPITAL_COMMUNITY): Payer: Self-pay

## 2022-02-05 ENCOUNTER — Encounter (HOSPITAL_BASED_OUTPATIENT_CLINIC_OR_DEPARTMENT_OTHER): Payer: Medicare Other

## 2022-02-15 ENCOUNTER — Ambulatory Visit (INDEPENDENT_AMBULATORY_CARE_PROVIDER_SITE_OTHER): Payer: Medicare Other

## 2022-02-15 DIAGNOSIS — I6522 Occlusion and stenosis of left carotid artery: Secondary | ICD-10-CM | POA: Diagnosis not present

## 2022-02-18 ENCOUNTER — Other Ambulatory Visit: Payer: Self-pay | Admitting: Oncology

## 2022-02-21 ENCOUNTER — Other Ambulatory Visit (HOSPITAL_BASED_OUTPATIENT_CLINIC_OR_DEPARTMENT_OTHER): Payer: Self-pay | Admitting: *Deleted

## 2022-02-21 DIAGNOSIS — I6523 Occlusion and stenosis of bilateral carotid arteries: Secondary | ICD-10-CM

## 2022-05-02 ENCOUNTER — Telehealth: Payer: Self-pay | Admitting: Oncology

## 2022-05-02 NOTE — Telephone Encounter (Signed)
Attempted to call patient to reschedule 6/14 appointment but no voicemail set up. Mailing reminders.

## 2022-05-29 ENCOUNTER — Other Ambulatory Visit: Payer: Self-pay | Admitting: Cardiovascular Disease

## 2022-05-29 DIAGNOSIS — E785 Hyperlipidemia, unspecified: Secondary | ICD-10-CM

## 2022-05-29 DIAGNOSIS — E7800 Pure hypercholesterolemia, unspecified: Secondary | ICD-10-CM

## 2022-05-29 DIAGNOSIS — I6521 Occlusion and stenosis of right carotid artery: Secondary | ICD-10-CM

## 2022-05-29 DIAGNOSIS — I6523 Occlusion and stenosis of bilateral carotid arteries: Secondary | ICD-10-CM

## 2022-06-20 ENCOUNTER — Other Ambulatory Visit: Payer: Self-pay | Admitting: Oncology

## 2022-06-20 DIAGNOSIS — D472 Monoclonal gammopathy: Secondary | ICD-10-CM

## 2022-06-20 NOTE — Progress Notes (Unsigned)
[facility] Cancer Follow up Visit:  Patient Care Team: Georgina Quint, MD as PCP - General (Internal Medicine) Chilton Si, MD as PCP - Cardiology (Cardiology) [name], Casimiro Needle, MD (Endocrinology) Charolett Bumpers, MD (Gastroenterology) Colon Branch, MD (Inactive) (Neurosurgery)  CHIEF COMPLAINTS/PURPOSE OF CONSULTATION:  Oncology History   No history exists.    HISTORY OF PRESENTING ILLNESS: Jo [name] 77 y.o. female is here because of  MGUS  MGUS diagnosed in 2022.  She was found to have IgG kappa with M spike of 0.2 g/dL without evidence of endorgan damage to suggest multiple myeloma.   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   Review of Systems - Oncology  MEDICAL HISTORY: Past Medical History:  Diagnosis Date   Allergy    Anemia    Arthritis    right knee   Carotid bruit 10/11/2019   Carotid stenosis 10/11/2019   Cataract    forming both   Chronic kidney disease    GERD (gastroesophageal reflux disease)    past hx   Goiter    Hyperlipidemia    Hypertension    Lower extremity edema 10/11/2019   Menopause age 78   Pure hypercholesterolemia 10/11/2019   Reflux    Shortness of breath 12/21/2020   Spinal stenosis     SURGICAL HISTORY: Past Surgical History:  Procedure Laterality Date   ANAL RECTAL MANOMETRY N/A 03/14/2021   Procedure: ANO RECTAL MANOMETRY;  Surgeon: Tressia Danas, MD;  Location: [facility];  Service: Gastroenterology;  Laterality: N/A;   CERVICAL SPINE SURGERY  06/15/2010   COLONOSCOPY  2005   normal   LEFT HEART CATH AND CORONARY ANGIOGRAPHY N/A 09/16/2017   Procedure: LEFT HEART CATH AND CORONARY ANGIOGRAPHY;  Surgeon: Orpah Cobb, MD;  Location: [facility];  Service: Cardiovascular;  Laterality: N/A;    SOCIAL HISTORY: Social History   Socioeconomic History   Marital status: Widowed    Spouse name: Not on file   Number of children: 3   Years of  education: college   Highest education level: Not on file  Occupational History   Occupation: CSST    Employer: [employer]  Tobacco Use   Smoking status: Never   Smokeless tobacco: Never  Substance and Sexual Activity   Alcohol use: No    Comment: glass of wine-special occasions   Drug use: No   Sexual activity: Never    Birth control/protection: Abstinence  Other Topics Concern   Not on file  Social History Narrative   Not on file   Social Determinants of Health   Financial Resource Strain: Not on file  Food Insecurity: Not on file  Transportation Needs: Not on file  Physical Activity: Not on file  Stress: Not on file  Social Connections: Not on file  Intimate Partner Violence: Not on file    FAMILY HISTORY Family History  Problem Relation Age of Onset   Stroke Mother    Hypertension Mother    Heart disease Mother    Heart disease Father    Heart attack Father    Cancer Sister    Breast cancer Sister    CAD Sister    Cancer Sister    CAD Sister    Lung cancer Sister    Cancer Brother    Heart disease Brother    Lung cancer Brother    Hypertension Daughter    Hypertension Other    Cancer Other  lung x 2   Colon cancer Neg Hx    Colon polyps Neg Hx    Esophageal cancer Neg Hx    Rectal cancer Neg Hx    Stomach cancer Neg Hx     ALLERGIES:  is allergic to demerol, crestor [rosuvastatin], and lipitor [atorvastatin].  MEDICATIONS:  Current Outpatient Medications  Medication Sig Dispense Refill   acetaminophen (TYLENOL) 325 MG tablet Take 325 mg by mouth every 6 (six) hours as needed for moderate pain.     aspirin EC 81 MG tablet Take 81 mg by mouth at bedtime.     Cholecalciferol (DIALYVITE VITAMIN D 5000) 125 MCG (5000 UT) capsule Take 5,000 Units by mouth at bedtime.     Evolocumab (REPATHA SURECLICK) 140 MG/ML SOAJ INJECT 1 ML SUBCUTANEOUSLY  EVERY TWO WEEKS 6 mL 3   FARXIGA 10 MG TABS tablet Take 10 mg by mouth daily.     Ferrous Sulfate  (IRON SLOW RELEASE PO) Take 1 tablet by mouth daily.     lisinopril (ZESTRIL) 10 MG tablet Take 10 mg by mouth daily.     ondansetron (ZOFRAN) 4 MG tablet Take 1 tablet (4 mg total) by mouth every 8 (eight) hours as needed for nausea or vomiting. 10 tablet 1   polyethylene glycol (MIRALAX / GLYCOLAX) packet Take 17 g by mouth daily as needed (constipation). Mix in 8 oz liquid and drink     triamcinolone cream (KENALOG) 0.1 % Apply 1 Application topically 2 (two) times daily. 30 g 0   vitamin B-12 (CYANOCOBALAMIN) 1000 MCG tablet Take 1,000 mcg by mouth at bedtime.      No current facility-administered medications for this visit.    PHYSICAL EXAMINATION:  ECOG PERFORMANCE STATUS: {CHL ONC ECOG PS:[phone]}   There were no vitals filed for this visit.  There were no vitals filed for this visit.   Physical Exam   LABORATORY DATA: I have personally reviewed the data as listed:  No visits with results within 1 Month(s) from this visit.  Latest known visit with results is:  Appointment on 12/27/2021  Component Date Value Ref Range Status   WBC Count 12/27/2021 8.6  4.0 - 10.5 K/uL Final   RBC 12/27/2021 3.59 (L)  3.87 - 5.11 MIL/uL Final   Hemoglobin 12/27/2021 11.0 (L)  12.0 - 15.0 g/dL Final   HCT 16/10/9602 33.4 (L)  36.0 - 46.0 % Final   MCV 12/27/2021 93.0  80.0 - 100.0 fL Final   MCH 12/27/2021 30.6  26.0 - 34.0 pg Final   MCHC 12/27/2021 32.9  30.0 - 36.0 g/dL Final   RDW 54/09/8117 14.0  11.5 - 15.5 % Final   Platelet Count 12/27/2021 225  150 - 400 K/uL Final   nRBC 12/27/2021 0.0  0.0 - 0.2 % Final   Neutrophils Relative % 12/27/2021 52  % Final   Neutro Abs 12/27/2021 4.5  1.7 - 7.7 K/uL Final   Lymphocytes Relative 12/27/2021 35  % Final   Lymphs Abs 12/27/2021 3.0  0.7 - 4.0 K/uL Final   Monocytes Relative 12/27/2021 10  % Final   Monocytes Absolute 12/27/2021 0.9  0.1 - 1.0 K/uL Final   Eosinophils Relative 12/27/2021 3  % Final   Eosinophils Absolute  12/27/2021 0.2  0.0 - 0.5 K/uL Final   Basophils Relative 12/27/2021 0  % Final   Basophils Absolute 12/27/2021 0.0  0.0 - 0.1 K/uL Final   Immature Granulocytes 12/27/2021 0  % Final   Abs Immature  Granulocytes 12/27/2021 0.02  0.00 - 0.07 K/uL Final   Performed at [facility], [address] [place], [place] [address]    RADIOGRAPHIC STUDIES: I have personally reviewed the radiological images as listed and agree with the findings in the report  No results found.  ASSESSMENT/PLAN Cancer Staging  No matching staging information was found for the patient.   No problem-specific Assessment & Plan notes found for this encounter.    No orders of the defined types were placed in this encounter.     minutes was spent in patient care.  This included time spent preparing to see the patient (e.g., review of tests), obtaining and/or reviewing separately obtained history, counseling and educating the patient/family/caregiver, ordering medications, tests, or procedures; documenting clinical information in the electronic or other health record, independently interpreting results and communicating results to the patient/family/caregiver as well as coordination of care.       All questions were answered. The patient knows to call the clinic with any problems, questions or concerns.  This note was electronically signed.    Loni Muse, MD  06/20/2022 10:49 AM

## 2022-06-21 ENCOUNTER — Other Ambulatory Visit: Payer: Self-pay

## 2022-06-21 ENCOUNTER — Inpatient Hospital Stay (HOSPITAL_BASED_OUTPATIENT_CLINIC_OR_DEPARTMENT_OTHER): Payer: Medicare Other | Admitting: Oncology

## 2022-06-21 ENCOUNTER — Inpatient Hospital Stay: Payer: Medicare Other | Attending: Oncology

## 2022-06-21 VITALS — BP 138/66 | HR 70 | Resp 16 | Wt 172.6 lb

## 2022-06-21 DIAGNOSIS — N189 Chronic kidney disease, unspecified: Secondary | ICD-10-CM | POA: Diagnosis not present

## 2022-06-21 DIAGNOSIS — Z79899 Other long term (current) drug therapy: Secondary | ICD-10-CM | POA: Diagnosis not present

## 2022-06-21 DIAGNOSIS — D472 Monoclonal gammopathy: Secondary | ICD-10-CM

## 2022-06-21 DIAGNOSIS — Z7982 Long term (current) use of aspirin: Secondary | ICD-10-CM | POA: Diagnosis not present

## 2022-06-21 DIAGNOSIS — N1831 Chronic kidney disease, stage 3a: Secondary | ICD-10-CM

## 2022-06-21 DIAGNOSIS — D631 Anemia in chronic kidney disease: Secondary | ICD-10-CM | POA: Diagnosis not present

## 2022-06-21 DIAGNOSIS — I129 Hypertensive chronic kidney disease with stage 1 through stage 4 chronic kidney disease, or unspecified chronic kidney disease: Secondary | ICD-10-CM | POA: Insufficient documentation

## 2022-06-21 LAB — CMP (CANCER CENTER ONLY)
ALT: 9 U/L (ref 0–44)
AST: 15 U/L (ref 15–41)
Albumin: 3.9 g/dL (ref 3.5–5.0)
Alkaline Phosphatase: 61 U/L (ref 38–126)
Anion gap: 6 (ref 5–15)
BUN: 41 mg/dL — ABNORMAL HIGH (ref 8–23)
CO2: 26 mmol/L (ref 22–32)
Calcium: 9.8 mg/dL (ref 8.9–10.3)
Chloride: 106 mmol/L (ref 98–111)
Creatinine: 1.75 mg/dL — ABNORMAL HIGH (ref 0.44–1.00)
GFR, Estimated: 29 mL/min — ABNORMAL LOW (ref >60)
Glucose, Bld: 90 mg/dL (ref 70–99)
Potassium: 4.4 mmol/L (ref 3.5–5.1)
Sodium: 138 mmol/L (ref 135–145)
Total Bilirubin: 0.4 mg/dL (ref 0.3–1.2)
Total Protein: 7.7 g/dL (ref 6.5–8.1)

## 2022-06-21 LAB — CBC WITH DIFFERENTIAL (CANCER CENTER ONLY)
Abs Immature Granulocytes: 0.02 10*3/uL (ref 0.00–0.07)
Basophils Absolute: 0 10*3/uL (ref 0.0–0.1)
Basophils Relative: 0 %
Eosinophils Absolute: 0.2 10*3/uL (ref 0.0–0.5)
Eosinophils Relative: 2 %
HCT: 32.8 % — ABNORMAL LOW (ref 36.0–46.0)
Hemoglobin: 10.5 g/dL — ABNORMAL LOW (ref 12.0–15.0)
Immature Granulocytes: 0 %
Lymphocytes Relative: 34 %
Lymphs Abs: 2.5 10*3/uL (ref 0.7–4.0)
MCH: 29.5 pg (ref 26.0–34.0)
MCHC: 32 g/dL (ref 30.0–36.0)
MCV: 92.1 fL (ref 80.0–100.0)
Monocytes Absolute: 0.7 10*3/uL (ref 0.1–1.0)
Monocytes Relative: 9 %
Neutro Abs: 4 10*3/uL (ref 1.7–7.7)
Neutrophils Relative %: 55 %
Platelet Count: 243 10*3/uL (ref 150–400)
RBC: 3.56 MIL/uL — ABNORMAL LOW (ref 3.87–5.11)
RDW: 13.8 % (ref 11.5–15.5)
WBC Count: 7.4 10*3/uL (ref 4.0–10.5)
nRBC: 0 % (ref 0.0–0.2)

## 2022-06-21 LAB — RETICULOCYTES
Immature Retic Fract: 10.1 % (ref 2.3–15.9)
RBC.: 3.52 MIL/uL — ABNORMAL LOW (ref 3.87–5.11)
Retic Count, Absolute: 44.7 10*3/uL (ref 19.0–186.0)
Retic Ct Pct: 1.3 % (ref 0.4–3.1)

## 2022-06-21 LAB — VITAMIN B12: Vitamin B-12: 1343 pg/mL — ABNORMAL HIGH (ref 180–914)

## 2022-06-21 LAB — FERRITIN: Ferritin: 192 ng/mL (ref 11–307)

## 2022-06-21 LAB — DIRECT ANTIGLOBULIN TEST (NOT AT ARMC)
DAT, IgG: NEGATIVE
DAT, complement: NEGATIVE

## 2022-06-21 LAB — FOLATE: Folate: 11.8 ng/mL (ref >5.9)

## 2022-06-22 ENCOUNTER — Encounter: Payer: Self-pay | Admitting: Oncology

## 2022-06-22 DIAGNOSIS — Z7982 Long term (current) use of aspirin: Secondary | ICD-10-CM | POA: Insufficient documentation

## 2022-06-23 LAB — HAPTOGLOBIN: Haptoglobin: 215 mg/dL (ref 42–346)

## 2022-06-24 ENCOUNTER — Telehealth: Payer: Self-pay | Admitting: Oncology

## 2022-06-24 LAB — KAPPA/LAMBDA LIGHT CHAINS
Kappa free light chain: 42.4 mg/L — ABNORMAL HIGH (ref 3.3–19.4)
Kappa, lambda light chain ratio: 1.45 (ref 0.26–1.65)
Lambda free light chains: 29.2 mg/L — ABNORMAL HIGH (ref 5.7–26.3)

## 2022-06-24 NOTE — Telephone Encounter (Signed)
Called patient received no answer. Unable to leave vm mailbox not set up

## 2022-06-25 LAB — HGB FRACTIONATION CASCADE
Hgb A2: 2.4 % (ref 1.8–3.2)
Hgb A: 97.6 % (ref 96.4–98.8)
Hgb F: 0 % (ref 0.0–2.0)
Hgb S: 0 %

## 2022-06-26 LAB — ZINC: Zinc: 65 ug/dL (ref 44–115)

## 2022-06-26 LAB — COPPER, SERUM: Copper: 144 ug/dL (ref 80–158)

## 2022-06-28 ENCOUNTER — Ambulatory Visit: Payer: Medicare Other | Admitting: Oncology

## 2022-06-28 ENCOUNTER — Other Ambulatory Visit: Payer: Medicare Other

## 2022-06-28 LAB — MULTIPLE MYELOMA PANEL, SERUM
Albumin SerPl Elph-Mcnc: 3.7 g/dL (ref 2.9–4.4)
Albumin/Glob SerPl: 1.1 (ref 0.7–1.7)
Alpha 1: 0.2 g/dL (ref 0.0–0.4)
Alpha2 Glob SerPl Elph-Mcnc: 0.8 g/dL (ref 0.4–1.0)
B-Globulin SerPl Elph-Mcnc: 1 g/dL (ref 0.7–1.3)
Gamma Glob SerPl Elph-Mcnc: 1.7 g/dL (ref 0.4–1.8)
Globulin, Total: 3.6 g/dL (ref 2.2–3.9)
IgA: 250 mg/dL (ref 64–422)
IgG (Immunoglobin G), Serum: 1795 mg/dL — ABNORMAL HIGH (ref 586–1602)
IgM (Immunoglobulin M), Srm: 78 mg/dL (ref 26–217)
M Protein SerPl Elph-Mcnc: 0.4 g/dL — ABNORMAL HIGH
Total Protein ELP: 7.3 g/dL (ref 6.0–8.5)

## 2022-10-01 ENCOUNTER — Ambulatory Visit
Admission: EM | Admit: 2022-10-01 | Discharge: 2022-10-01 | Disposition: A | Discharge status: Home / Self Care | Payer: Medicare Other | Attending: Internal Medicine | Admitting: Internal Medicine

## 2022-10-01 DIAGNOSIS — M5441 Lumbago with sciatica, right side: Secondary | ICD-10-CM | POA: Diagnosis not present

## 2022-10-01 MED ORDER — LIDOCAINE 5 % EX PTCH
1.0000 | MEDICATED_PATCH | CUTANEOUS | 0 refills | Status: DC
Start: 2022-10-01 — End: 2022-11-12

## 2022-10-01 MED ORDER — CYCLOBENZAPRINE HCL 5 MG PO TABS
5.0000 mg | ORAL_TABLET | Freq: Every evening | ORAL | 0 refills | Status: DC | PRN
Start: 2022-10-01 — End: 2022-11-12

## 2022-10-01 NOTE — Discharge Instructions (Signed)
Start Lidoderm patch to your right lower back daily.  Places on your back for 12 hours and remove for take Flexeril as needed at bedtime.  This can make you drowsy so do not drink alcohol or drive while you are on this medication.  Continue Tylenol as needed.  Heat to the back as needed.  Do not apply heat over the Lidoderm patch.  Lots of rest.  Please follow-up with your PCP if symptoms do not improve.  Please go to the emergency room for any worsening symptoms.  I hope you feel better soon!

## 2022-10-01 NOTE — ED Provider Notes (Addendum)
[facility]    CSN: [id number] Arrival date & time: 10/01/22  1233      History   Chief Complaint Chief Complaint  Patient presents with   Hip Pain    HPI Jo [name] is a 77 y.o. female presents for back pain.  Patient reports 1 week of intermittent right lower back pain that does radiate down her right leg.  She denies any known injury or inciting events.  Denies any numbness/tingling/weakness of her extremities, no bowel or bladder incontinence, no saddle paresthesia.  Denies history of chronic low back pain.  No history of back surgeries.  She has been taking Tylenol with minimal improvement.  No other concerns at this time.   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   Dyslipidemia 11/05/2021   Constipation due to outlet dysfunction    Deficiency anemia 01/30/2021   MGUS (monoclonal gammopathy of unknown significance) 10/18/2020   Unilateral primary osteoarthritis, right knee 05/24/2020   Stage 3a chronic kidney disease (HCC) 10/26/2019   Pure hypercholesterolemia 10/11/2019   Carotid stenosis 10/11/2019   Impaired fasting glucose 12/18/2016   Pre-diabetes 10/25/2014   Essential hypertension    Mild anemia     Past Surgical History:  Procedure Laterality Date   ANAL RECTAL MANOMETRY N/A 03/14/2021   Procedure: ANO RECTAL MANOMETRY;  Surgeon: Tressia Danas, MD;  Location:  [facility];  Service: Gastroenterology;  Laterality: N/A;   CERVICAL SPINE SURGERY  06/15/2010   COLONOSCOPY  2005   normal   LEFT HEART CATH AND CORONARY ANGIOGRAPHY N/A 09/16/2017   Procedure: LEFT HEART CATH AND CORONARY ANGIOGRAPHY;  Surgeon: Orpah Cobb, MD;  Location: [facility];  Service: Cardiovascular;  Laterality: N/A;    OB History   No obstetric history on file.      Home Medications    Prior to Admission medications   Medication Sig Start Date End Date Taking? Authorizing Provider  cyclobenzaprine (FLEXERIL) 5 MG tablet Take 1 tablet (5 mg total) by mouth at bedtime as needed for muscle spasms. 10/01/22  Yes Radford Pax, NP  lidocaine (LIDODERM) 5 % Place 1 patch onto the skin daily. Keep on the area for 12 hours then remove for 12 hours 10/01/22  Yes Radford Pax, NP  acetaminophen (TYLENOL) 325 MG tablet Take 325 mg by mouth every 6 (six) hours as needed for moderate pain.    [provider]  aspirin EC 81 MG tablet Take 81 mg by mouth at bedtime.    [provider]  Cholecalciferol (DIALYVITE VITAMIN D 5000) 125 MCG (5000 UT) capsule Take 5,000 Units by mouth at bedtime.    [provider]  Evolocumab (REPATHA SURECLICK) 140 MG/ML SOAJ INJECT 1 ML SUBCUTANEOUSLY  EVERY  TWO WEEKS 05/29/22   Chilton Si, MD  FARXIGA 10 MG TABS tablet Take 10 mg by mouth daily. 02/15/21   [provider]  Ferrous Sulfate (IRON SLOW RELEASE PO) Take 1 tablet by mouth daily.    [provider]  lisinopril (ZESTRIL) 10 MG tablet Take 10 mg by mouth daily. 02/15/21   [provider]  ondansetron (ZOFRAN) 4 MG tablet Take 1 tablet (4 mg total) by mouth every 8 (eight) hours as needed for nausea or vomiting. 01/08/21   Cirigliano, Vito V, DO  polyethylene glycol (MIRALAX / GLYCOLAX) packet Take 17 g by mouth daily as needed (constipation). Mix in 8 oz liquid and drink    [provider]  triamcinolone cream (KENALOG) 0.1  % Apply 1 Application topically 2 (two) times daily. 07/02/21   Wallis Bamberg, PA-C  vitamin B-12 (CYANOCOBALAMIN) 1000 MCG tablet Take 1,000 mcg by mouth at bedtime.     [provider]    Family History Family History  Problem Relation Age of Onset   Stroke Mother    Hypertension Mother    Heart disease Mother    Heart disease Father    Heart attack Father    Cancer Sister    Breast cancer Sister    CAD Sister    Cancer Sister    CAD Sister    Lung cancer Sister    Cancer Brother    Heart disease Brother    Lung cancer Brother    Hypertension Daughter    Hypertension Other    Cancer Other        lung x 2   Colon cancer Neg Hx    Colon polyps Neg Hx    Esophageal cancer Neg Hx    Rectal cancer Neg Hx    Stomach cancer Neg Hx     Social History Social History   Tobacco Use   Smoking status: Never   Smokeless tobacco: Never  Substance Use Topics   Alcohol use: No    Comment: glass of wine-special occasions   Drug use: No     Allergies   Demerol, Crestor [rosuvastatin], and Lipitor [atorvastatin]   Review of Systems Review of Systems  Musculoskeletal:  Positive for back pain.     Physical Exam Triage Vital Signs ED Triage Vitals  Encounter Vitals Group     BP 10/01/22 1248 116/69     Systolic BP Percentile --      Diastolic BP Percentile --      Pulse Rate 10/01/22 1248 71     Resp 10/01/22 1248 17     Temp 10/01/22 1248 98.5 F (36.9 C)     Temp Source 10/01/22 1248 Oral     SpO2 10/01/22 1248 96 %     Weight --      Height --      Head Circumference --      Peak Flow --      Pain Score 10/01/22 1247 6     Pain Loc --      Pain Education --      Exclude from Growth Chart --    No data found.  Updated Vital Signs BP 116/69 (BP Location: Right Arm)   Pulse 71   Temp 98.5 F (36.9 C) (Oral)   Resp 17   SpO2 96%   Visual Acuity Right Eye Distance:   Left Eye Distance:   Bilateral Distance:    Right Eye Near:   Left Eye  Near:    Bilateral Near:     Physical Exam Vitals and nursing note reviewed.  Constitutional:      General: She is not in acute distress.    Appearance: Normal appearance. She is not ill-appearing.  HENT:     Head: Normocephalic and atraumatic.  Eyes:     Pupils: Pupils are equal, round, and reactive to light.  Cardiovascular:     Rate and Rhythm: Normal rate.  Pulmonary:     Effort: Pulmonary effort is normal.  Musculoskeletal:     Lumbar back: Spasms and tenderness present. No swelling, edema, deformity, signs of trauma, lacerations or bony tenderness. Normal range of motion. Positive right straight leg raise test. No scoliosis.       Back:     Comments: Strength 5 out of 5 bilateral lower extremities.  Skin:    General: Skin is warm and dry.  Neurological:     General: No focal deficit present.     Mental Status: She is alert and oriented to person, place, and time.  Psychiatric:        Mood and Affect: Mood normal.        Behavior: Behavior normal.      UC Treatments / Results  Labs (all labs ordered are listed, but only abnormal results are displayed) Labs Reviewed - No data to display CMP [facility] only) Order: [id number] Status: Final result     Visible to patient: No (inaccessible in MyChart)     Next appt: 11/07/2022 at 10:40 AM in Internal Medicine [facility] Victorino December, MD)     Dx: MGUS (monoclonal gammopathy of unknow...   0 Result Notes          Component Ref Range & Units 3 mo ago (06/21/22) 1 yr ago (08/15/21) 1 yr ago (05/15/21) 1 yr ago (01/23/21) 1 yr ago (10/18/20) 2 yr ago (06/21/20) 2 yr ago (05/22/20)  Sodium 135 - 145 mmol/L 138 139 141 R 138 137 R 141 R 140 R  Potassium 3.5 - 5.1 mmol/L 4.4 4.2 5.3 High  R 4.0 4.6 R 4.9 R 5.4 No hemolysis seen High  R  Chloride 98 - 111 mmol/L 106 105 103 R 106 101 R 103 R 104 R  CO2 22 - 32 mmol/L 26 31 27  R 27 30 R 24 R 30 R  Glucose, Bld 70 - 99 mg/dL 90 88 CM 84 130 High  CM 82 86 R 89  Comment:  Glucose reference range applies only to samples taken after fasting for at least 8 hours.  BUN 8 - 23 mg/dL 41 High  36 High  26 R 27 High  34 High  R 40 High  R 36 High  R  Creatinine 0.44 - 1.00 mg/dL 8.65 High  7.84 High  6.96 High  R 1.44 High  1.90 High  R 1.43 High  R 1.51 High  R  Calcium 8.9 - 10.3 mg/dL 9.8 9.4 29.5 R 9.5 28.4 R 9.8 R 10.5 R  Total Protein 6.5 - 8.1 g/dL 7.7 7.1 7.1 R 7.0 7.3 R 6.4 R 7.4 R  Albumin 3.5 - 5.0 g/dL 3.9 3.9 4.1 R 3.7 3.9 R 3.8 R 3.9 R  AST 15 - 41 U/L 15 15 17  R 14 Low  18 R 22 R 18 R  ALT 0 - 44 U/L 9 9 12  R 7 12 R 13 R 13 R  Alkaline Phosphatase 38 - 126 U/L 61 66 71 R 48  55 R 54 R 47 R  Total Bilirubin 0.3 - 1.2 mg/dL 0.4 0.3 0.3 R 0.4 0.3 R 0.2 R 0.4 R  GFR, Estimated >60 mL/min 29 Low  42 Low  CM  38 Low  CM     Comment: (NOTE) Calculated using the CKD-EPI Creatinine Equation (2021)  Anion gap 5 - 15 6 3  Low  CM  5 CM     Comment: Performed at [facility], [address], [place], [place] [address]  Resulting Agency [facility]            EKG   Radiology No results found.  Procedures Procedures (including critical care time)  Medications Ordered in UC Medications - No data to display  Initial Impression / Assessment and Plan / UC Course  I have reviewed the triage vital signs and the nursing notes.  Pertinent labs & imaging results that were available during my care of the patient were reviewed by me and considered in my medical decision making (see chart for details).     Reviewed exam and symptoms with patient.  No red flags.  Discussed low back pain/sciatica.  No NSAIDs due to chronic kidney disease.  Will do Lidoderm patch and low-dose Flexeril at night.  Side effect was reviewed.  Continue Tylenol as needed.  Heat also advise.  PCP follow-up if symptoms do not improve.  ER precautions reviewed and patient verbalized  understanding. Final Clinical Impressions(s) / UC Diagnoses   Final diagnoses:  Acute right-sided low back pain with right-sided sciatica     Discharge Instructions      Start Lidoderm patch to your right lower back daily.  Places on your back for 12 hours and remove for take Flexeril as needed at bedtime.  This can make you drowsy so do not drink alcohol or drive while you are on this medication.  Continue Tylenol as needed.  Heat to the back as needed.  Do not apply heat over the Lidoderm patch.  Lots of rest.  Please follow-up with your PCP if symptoms do not improve.  Please go to the emergency room for any worsening symptoms.  I hope you feel better soon!    ED Prescriptions     Medication Sig Dispense Auth. Provider   lidocaine (LIDODERM) 5 % Place 1 patch onto the skin daily. Keep on the area for 12 hours then remove for 12 hours 10 patch Radford Pax, NP   cyclobenzaprine (FLEXERIL) 5 MG tablet Take 1 tablet (5 mg total) by mouth at bedtime as needed for muscle spasms. 5 tablet Radford Pax, NP      PDMP not reviewed this encounter.   Radford Pax, NP 10/01/22 1307    Radford Pax, NP 10/01/22 1308

## 2022-10-01 NOTE — ED Triage Notes (Signed)
Pt presents with c/o hip pain X 1 week. States it has worsened and become more painful. Has applied cold/heat compress. States she feels a burning sensation when applying pressure. Has difficulty sleeping at night.   Has only taken tylenol for relief.

## 2022-10-07 ENCOUNTER — Other Ambulatory Visit: Payer: Self-pay

## 2022-10-07 ENCOUNTER — Emergency Department (HOSPITAL_BASED_OUTPATIENT_CLINIC_OR_DEPARTMENT_OTHER)
Admission: EM | Admit: 2022-10-07 | Discharge: 2022-10-07 | Disposition: A | Discharge status: Home / Self Care | Payer: Medicare Other | Attending: Emergency Medicine | Admitting: Emergency Medicine

## 2022-10-07 ENCOUNTER — Emergency Department (HOSPITAL_BASED_OUTPATIENT_CLINIC_OR_DEPARTMENT_OTHER): Payer: Medicare Other | Admitting: Radiology

## 2022-10-07 ENCOUNTER — Encounter (HOSPITAL_BASED_OUTPATIENT_CLINIC_OR_DEPARTMENT_OTHER): Payer: Self-pay | Admitting: Pediatrics

## 2022-10-07 DIAGNOSIS — M5441 Lumbago with sciatica, right side: Secondary | ICD-10-CM | POA: Insufficient documentation

## 2022-10-07 DIAGNOSIS — Z79899 Other long term (current) drug therapy: Secondary | ICD-10-CM | POA: Diagnosis not present

## 2022-10-07 DIAGNOSIS — M545 Low back pain, unspecified: Secondary | ICD-10-CM | POA: Diagnosis present

## 2022-10-07 DIAGNOSIS — N189 Chronic kidney disease, unspecified: Secondary | ICD-10-CM | POA: Insufficient documentation

## 2022-10-07 DIAGNOSIS — Z7982 Long term (current) use of aspirin: Secondary | ICD-10-CM | POA: Diagnosis not present

## 2022-10-07 DIAGNOSIS — I129 Hypertensive chronic kidney disease with stage 1 through stage 4 chronic kidney disease, or unspecified chronic kidney disease: Secondary | ICD-10-CM | POA: Diagnosis not present

## 2022-10-07 DIAGNOSIS — M5431 Sciatica, right side: Secondary | ICD-10-CM

## 2022-10-07 MED ORDER — GABAPENTIN 100 MG PO CAPS: 100.0000 mg | ORAL_CAPSULE | Freq: Three times a day (TID) | ORAL | 0 refills | Status: DC

## 2022-10-07 MED ORDER — GABAPENTIN 100 MG PO CAPS
100.0000 mg | ORAL_CAPSULE | Freq: Once | ORAL | Status: AC
  Administered 2022-10-07: 100 mg via ORAL
  Filled 2022-10-07: qty 1

## 2022-10-07 NOTE — ED Triage Notes (Signed)
C/O hip pain on right side x 2 weeks, with some burning sensation on upper and mid right leg, and some numbness on lower legs to toes. States pain is worst with certain positioning and walking, denies any recent fall, denies loss control of bowel and bladder. States OTC meds with min help.

## 2022-10-07 NOTE — Discharge Instructions (Signed)
You were seen in the emergency department for right-sided back pain and right leg pain The most likely cause of your back pain is related to your sciatic nerve (also called sciatica) For this reason we have prescribed a medication called gabapentin which can help with nerve pain It is important that you take this medication as directed and follow-up with your primary care doctor as scheduled next month or earlier if you are able Return to the emergency department for severe pain, if you are unable to walk, have a loss of urine or bowel or for any other concerns

## 2022-10-07 NOTE — ED Provider Notes (Signed)
[place] EMERGENCY DEPARTMENT AT [facility] Provider Note   CSN: [id number] Arrival date & time: 10/07/22  1311     History  Chief Complaint  Patient presents with   Hip Pain    Jo [name] is a 77 y.o. female.  With a past medical history of MGUS, CKD, hypertension hyperlipidemia presents to the ED for back pain.  She now reports over 2 weeks of ongoing right sided back pain.  Pain localized over right lower back with radiation laterally down right lower extremity.  Discomfort made worse with certain positions and has a burning quality.  Was seen in the emergency department last week with the same complaint.  She has been trying cyclobenzaprine and Tylenol at home with little relief.  No fevers, chills, history of spinal surgeries, bowel/urinary incontinence, history of IV drug use or saddle anesthesia.  No recent falls or traumatic injuries.  She has been able to walk with her cane.  No other complaints at this time.   Hip Pain       Home Medications Prior to Admission medications   Medication Sig Start Date End Date Taking? Authorizing Provider  gabapentin (NEURONTIN) 100 MG capsule Take 1 capsule (100 mg total) by mouth 3 (three) times daily. 10/07/22  Yes Royanne Foots, DO  acetaminophen (TYLENOL) 325 MG tablet Take 325 mg by mouth every 6 (six) hours as needed for moderate pain.    [provider]  aspirin EC 81 MG tablet Take 81 mg by mouth at bedtime.    [provider]  Cholecalciferol (DIALYVITE VITAMIN D 5000) 125 MCG (5000 UT) capsule Take 5,000 Units by mouth at bedtime.    [provider]  cyclobenzaprine (FLEXERIL) 5 MG tablet Take 1 tablet (5 mg total) by mouth at bedtime as needed for muscle spasms. 10/01/22   Radford Pax, NP  Evolocumab (REPATHA SURECLICK) 140 MG/ML SOAJ INJECT 1 ML SUBCUTANEOUSLY  EVERY TWO WEEKS 05/29/22   Chilton Si, MD  FARXIGA 10 MG TABS tablet Take 10 mg by mouth daily. 02/15/21   [provider]  Ferrous Sulfate (IRON SLOW RELEASE PO) Take 1 tablet by mouth daily.    [provider]  lidocaine (LIDODERM) 5 % Place 1 patch onto the skin daily. Keep on the area for 12 hours then remove for 12 hours 10/01/22   Radford Pax, NP  lisinopril (ZESTRIL) 10 MG tablet Take 10 mg by mouth daily. 02/15/21   [provider]  ondansetron (ZOFRAN) 4 MG tablet Take 1 tablet (4 mg total) by mouth every 8 (eight) hours as needed for nausea or vomiting. 01/08/21   Cirigliano, Vito V, DO  polyethylene glycol (MIRALAX / GLYCOLAX) packet Take 17 g by mouth daily as needed (constipation). Mix in 8 oz liquid and drink    [provider]  triamcinolone cream (KENALOG) 0.1 % Apply 1 Application topically 2 (two) times daily. 07/02/21   Wallis Bamberg, PA-C  vitamin B-12 (CYANOCOBALAMIN) 1000 MCG tablet Take 1,000 mcg by mouth at bedtime.     [provider]      Allergies    Demerol, Crestor [rosuvastatin], and Lipitor [atorvastatin]    Review of Systems   Review of Systems  Physical Exam Updated Vital Signs BP 138/78   Pulse 65   Temp 98 F (36.7 C)   Resp 18   Ht 5\' 7"  (1.702 m)   Wt 79.8 kg   SpO2 100%   BMI 27.57  kg/m  Physical Exam Vitals and nursing note reviewed.  HENT:     Head: Normocephalic and atraumatic.  Eyes:     Pupils: Pupils are equal, round, and reactive to light.  Cardiovascular:     Rate and Rhythm: Normal rate and regular rhythm.  Pulmonary:     Effort: Pulmonary effort is normal.     Breath sounds: Normal breath sounds.  Abdominal:     Palpations: Abdomen is soft.     Tenderness: There is no abdominal tenderness.  Musculoskeletal:     Comments: Right lower lumbar paraspinal tenderness with no midline tenderness step-off or deformity 5 out of 5 motor strength bilateral lower extremities 2+ bilateral patellar DTRs   Skin:    General: Skin is warm and dry.  Neurological:     General: No focal deficit present.     Mental  Status: She is alert.     Sensory: No sensory deficit.     Motor: No weakness.     Deep Tendon Reflexes: Reflexes normal.  Psychiatric:        Mood and Affect: Mood normal.     ED Results / Procedures / Treatments   Labs (all labs ordered are listed, but only abnormal results are displayed) Labs Reviewed - No data to display  EKG None  Radiology DG Hip Unilat  With Pelvis 2-3 Views Right  Result Date: 10/07/2022 CLINICAL DATA:  Right hip pain for 2 weeks EXAM: DG HIP (WITH OR WITHOUT PELVIS) 2-3V RIGHT COMPARISON:  Hip radiographs 09/02/2015 FINDINGS: There is no acute osseous finding in the hips. There is mild right worse than left femoroacetabular joint space narrowing with associated subchondral sclerosis and mild osteophytosis. The SI joints and symphysis pubis are intact. There is no erosive change. The soft tissues are unremarkable. 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 No acute finding. Electronically Signed   By: Lesia Hausen M.D.   On: 10/07/2022 15:14    Procedures Procedures    Medications Ordered in ED Medications  gabapentin (NEURONTIN) capsule 100 mg (100 mg Oral Given 10/07/22 1554)    ED Course/ Medical Decision Making/ A&P Clinical Course as of 10/07/22 1627  Mon Oct 07, 2022  1625 Patient remains resting comfortably at this time.  We reviewed the x-ray findings.  Discussed plan for low-dose gabapentin 3 times daily outpatient and need for PCP follow-up.  Return precautions were discussed with her and her daughter at the bedside in detail. [MP]    Clinical Course User Index [MP] Royanne Foots, DO                                 Medical Decision Making 77 year old female with history as above returns for ongoing back pain for the last 2 to 3 weeks.  Slightly hypertensive but otherwise unremarkable vital signs.  Well-appearing on exam.  No red flag features that would be concerning for cauda equina/epidural hematoma/epidural  abscess at this time.  Physical exam notable for right lower lumbar paraspinal tenderness with no midline tenderness.  Distribution of pain with straight leg raise is down lateral right lower extremity to the knee.  Presentation most consistent with sciatic nerve pain.  X-ray of the right hip shows no osseous abnormality other than some arthritic changes.  Will trial gabapentin to help with neuropathic pain.  Will start with low-dose (100 mg 3 times daily) considering patient's history of chronic kidney disease  and instruct for close PCP follow-up.  Return precautions were discussed with the patient in detail.  Amount and/or Complexity of Data Reviewed Radiology: ordered.    Details: Right hip/pelvis x-ray No acute osseous abnormality.  Risk Prescription drug management.           Final Clinical Impression(s) / ED Diagnoses Final diagnoses:  Sciatica of right side    Rx / DC Orders ED Discharge Orders          Ordered    gabapentin (NEURONTIN) 100 MG capsule  3 times daily        10/07/22 1627              Royanne Foots, DO 10/07/22 1628

## 2022-10-17 ENCOUNTER — Encounter: Payer: Self-pay | Admitting: Internal Medicine

## 2022-10-17 ENCOUNTER — Ambulatory Visit: Payer: Medicare Other | Admitting: Internal Medicine

## 2022-10-17 ENCOUNTER — Ambulatory Visit: Payer: Medicare Other

## 2022-10-17 ENCOUNTER — Ambulatory Visit: Payer: Medicare Other | Admitting: Emergency Medicine

## 2022-10-17 VITALS — BP 146/96 | HR 80 | Temp 98.0°F | Ht 67.0 in | Wt 186.0 lb

## 2022-10-17 DIAGNOSIS — I10 Essential (primary) hypertension: Secondary | ICD-10-CM

## 2022-10-17 DIAGNOSIS — M5416 Radiculopathy, lumbar region: Secondary | ICD-10-CM

## 2022-10-17 DIAGNOSIS — D539 Nutritional anemia, unspecified: Secondary | ICD-10-CM | POA: Diagnosis not present

## 2022-10-17 DIAGNOSIS — M51362 Other intervertebral disc degeneration, lumbar region with discogenic back pain and lower extremity pain: Secondary | ICD-10-CM

## 2022-10-17 DIAGNOSIS — E7800 Pure hypercholesterolemia, unspecified: Secondary | ICD-10-CM | POA: Diagnosis not present

## 2022-10-17 DIAGNOSIS — N1832 Chronic kidney disease, stage 3b: Secondary | ICD-10-CM

## 2022-10-17 LAB — IBC + FERRITIN
Ferritin: 135.2 ng/mL (ref 10.0–291.0)
Iron: 58 ug/dL (ref 42–145)
Saturation Ratios: 20.3 % (ref 20.0–50.0)
TIBC: 285.6 ug/dL (ref 250.0–450.0)
Transferrin: 204 mg/dL — ABNORMAL LOW (ref 212.0–360.0)

## 2022-10-17 LAB — LIPID PANEL
Cholesterol: 204 mg/dL — ABNORMAL HIGH (ref 0–200)
HDL: 61.4 mg/dL (ref >39.00)
LDL Cholesterol: 113 mg/dL — ABNORMAL HIGH (ref 0–99)
NonHDL: 142.92
Total CHOL/HDL Ratio: 3
Triglycerides: 151 mg/dL — ABNORMAL HIGH (ref 0.0–149.0)
VLDL: 30.2 mg/dL (ref 0.0–40.0)

## 2022-10-17 LAB — BASIC METABOLIC PANEL
BUN: 32 mg/dL — ABNORMAL HIGH (ref 6–23)
CO2: 31 meq/L (ref 19–32)
Calcium: 10.3 mg/dL (ref 8.4–10.5)
Chloride: 103 meq/L (ref 96–112)
Creatinine, Ser: 1.38 mg/dL — ABNORMAL HIGH (ref 0.40–1.20)
GFR: 36.64 mL/min — ABNORMAL LOW (ref >60.00)
Glucose, Bld: 91 mg/dL (ref 70–99)
Potassium: 4.4 meq/L (ref 3.5–5.1)
Sodium: 138 meq/L (ref 135–145)

## 2022-10-17 NOTE — Progress Notes (Unsigned)
Subjective:  [name], female    DOB: [birth year]  Age: 77 y.o. MRN: [id number]  CC: Hypertension, Back Pain, and Anemia   HPI Jo [name] presents for f/up ----  Discussed the use of AI scribe software for clinical note transcription with the patient, who gave verbal consent to proceed.  History of Present Illness   The patient, with a history of arthritis and anemia of unknown etiology, presented with a 3-4-week history of pain in the right hip and lower back, described as a burning sensation. The pain radiates down the right leg and is associated with numbness and constant tingling down to the big toe. The pain is exacerbated by standing and at night. The patient denies any recent trauma or falls.  Initial management at an urgent care center included Flexeril, lidocaine patches, and gabapentin, which provided temporary relief but failed to resolve the symptoms. The patient was diagnosed with sciatica at the urgent care center, but no imaging of the lower back has been performed to date.  The patient also has a history of kidney issues and is under the care of a hematologist for anemia. The cause of the anemia remains undetermined. The patient denies any weakness in the legs but describes a constant pain in the leg.       Outpatient Medications Prior to Visit  Medication Sig Dispense Refill   acetaminophen (TYLENOL) 325 MG tablet Take 325 mg by mouth every 6 (six) hours as needed for moderate pain.     aspirin EC 81 MG tablet Take 81 mg by mouth at bedtime.     Cholecalciferol (DIALYVITE VITAMIN D 5000) 125 MCG (5000 UT) capsule Take 5,000 Units by mouth at bedtime.     cyclobenzaprine (FLEXERIL) 5 MG tablet Take 1 tablet (5 mg total) by mouth at bedtime as needed for muscle spasms. 5 tablet 0   Evolocumab (REPATHA SURECLICK) 140 MG/ML SOAJ INJECT 1 ML SUBCUTANEOUSLY  EVERY TWO WEEKS 6 mL 3   FARXIGA 10 MG TABS tablet Take 10 mg by mouth daily.     Ferrous  Sulfate (IRON SLOW RELEASE PO) Take 1 tablet by mouth daily.     gabapentin (NEURONTIN) 100 MG capsule Take 1 capsule (100 mg total) by mouth 3 (three) times daily. 90 capsule 0   lidocaine (LIDODERM) 5 % Place 1 patch onto the skin daily. Keep on the area for 12 hours then remove for 12 hours 10 patch 0   lisinopril (ZESTRIL) 10 MG tablet Take 10 mg by mouth daily.     ondansetron (ZOFRAN) 4 MG tablet Take 1 tablet (4 mg total) by mouth every 8 (eight) hours as needed for nausea or vomiting. 10 tablet 1   polyethylene glycol (MIRALAX / GLYCOLAX) packet Take 17 g by mouth daily as needed (constipation). Mix in 8 oz liquid and drink     triamcinolone cream (KENALOG) 0.1 % Apply 1 Application topically 2 (two) times daily. 30 g 0   vitamin B-12 (CYANOCOBALAMIN) 1000 MCG tablet Take 1,000 mcg by mouth at bedtime.      No facility-administered medications prior to visit.    ROS Review of Systems  Constitutional:  Negative for chills, diaphoresis, fatigue and fever.  HENT: Negative.    Eyes: Negative.   Respiratory:  Negative for cough, chest tightness, shortness of breath and wheezing.   Cardiovascular:  Negative for chest pain, palpitations and leg swelling.  Gastrointestinal:  Negative for abdominal pain, constipation, diarrhea,  nausea and vomiting.  Genitourinary: Negative.  Negative for difficulty urinating.  Musculoskeletal:  Positive for arthralgias, back pain and gait problem. Negative for myalgias.  Skin: Negative.   Neurological:  Positive for weakness and numbness. Negative for dizziness and light-headedness.  Hematological:  Negative for adenopathy. Does not bruise/bleed easily.  Psychiatric/Behavioral: Negative.      Objective:  BP (!) 146/96 (BP Location: Right Arm, Patient Position: Sitting, Cuff Size: Large)   Pulse 80   Temp 98 F (36.7 C) (Oral)   Ht 5\' 7"  (1.702 m)   Wt 186 lb (84.4 kg)   SpO2 97%   BMI 29.13 kg/m   BP Readings from Last 3 Encounters:  10/17/22  (!) 146/96  10/07/22 (!) 159/81  10/01/22 116/69    Wt Readings from Last 3 Encounters:  10/17/22 186 lb (84.4 kg)  10/07/22 176 lb (79.8 kg)  06/21/22 172 lb 9.6 oz (78.3 kg)    Physical Exam Vitals reviewed.  Constitutional:      Appearance: Normal appearance.  HENT:     Mouth/Throat:     Mouth: Mucous membranes are moist.  Eyes:     General: No scleral icterus.    Conjunctiva/sclera: Conjunctivae normal.  Cardiovascular:     Rate and Rhythm: Normal rate and regular rhythm.     Heart sounds: No murmur heard.    No friction rub. No gallop.  Pulmonary:     Effort: Pulmonary effort is normal.     Breath sounds: No stridor. No wheezing, rhonchi or rales.  Abdominal:     General: Abdomen is flat.     Palpations: There is no mass.     Tenderness: There is no abdominal tenderness. There is no guarding.     Hernia: No hernia is present.  Musculoskeletal:     Cervical back: Neck supple.     Right lower leg: No edema.     Left lower leg: No edema.  Lymphadenopathy:     Cervical: No cervical adenopathy.  Skin:    General: Skin is warm and dry.  Neurological:     General: No focal deficit present.     Mental Status: She is alert. Mental status is at baseline.     Lab Results  Component Value Date   WBC 7.4 06/21/2022   HGB 10.5 (L) 06/21/2022   HCT 32.8 (L) 06/21/2022   PLT 243 06/21/2022   GLUCOSE 91 10/17/2022   CHOL 204 (H) 10/17/2022   TRIG 151.0 (H) 10/17/2022   HDL 61.40 10/17/2022   LDLCALC 113 (H) 10/17/2022   ALT 9 06/21/2022   AST 15 06/21/2022   NA 138 10/17/2022   K 4.4 10/17/2022   CL 103 10/17/2022   CREATININE 1.38 (H) 10/17/2022   BUN 32 (H) 10/17/2022   CO2 31 10/17/2022   TSH 1.280 12/21/2020   INR 1.0 12/13/2020   HGBA1C 5.9 (H) 12/28/2020    DG Hip Unilat  With Pelvis 2-3 Views Right  Result Date: 10/07/2022 CLINICAL DATA:  Right hip pain for 2 weeks EXAM: DG HIP (WITH OR WITHOUT PELVIS) 2-3V RIGHT COMPARISON:  Hip radiographs  09/02/2015 FINDINGS: There is no acute osseous finding in the hips. There is mild right worse than left femoroacetabular joint space narrowing with associated subchondral sclerosis and mild osteophytosis. The SI joints and symphysis pubis are intact. There is no erosive change. The soft tissues are unremarkable. 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 No acute finding. Electronically Signed   By: Theron Arista  Noone M.D.   On: 10/07/2022 15:14   DG Lumbar Spine Complete  Result Date: 10/22/2022 CLINICAL DATA:  Low back pain radiating to right lower extremity. Back pain for 3 weeks. EXAM: LUMBAR SPINE - COMPLETE 4+ VIEW COMPARISON:  None Available. FINDINGS: Five non-rib-bearing lumbar vertebra straightening of normal lordosis. Diffuse disc space narrowing and spurring L2-L3, L3-L4, and L4-L5. Mild-to-moderate multilevel facet hypertrophy most prominent in the lower lumbar spine. No evidence of fracture, pars defects or focal bone abnormalities. The sacroiliac joints are congruent. IMPRESSION: 1. Multilevel degenerative disc disease and facet hypertrophy. 2. Straightening of normal lordosis may be due to positioning or muscle spasm. Electronically Signed   By: Narda Rutherford M.D.   On: 10/22/2022 15:18   DG Hip Unilat  With Pelvis 2-3 Views Right  Result Date: 10/07/2022 CLINICAL DATA:  Right hip pain for 2 weeks EXAM: DG HIP (WITH OR WITHOUT PELVIS) 2-3V RIGHT COMPARISON:  Hip radiographs 09/02/2015 FINDINGS: There is no acute osseous finding in the hips. There is mild right worse than left femoroacetabular joint space narrowing with associated subchondral sclerosis and mild osteophytosis. The SI joints and symphysis pubis are intact. There is no erosive change. The soft tissues are unremarkable. 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 No acute finding. Electronically Signed   By: Lesia Hausen M.D.   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  Sanda Linger, MD

## 2022-10-17 NOTE — Patient Instructions (Signed)
Sciatica  Sciatica is pain, numbness, weakness, or tingling along the path of the sciatic nerve. The sciatic nerve starts in the lower back and runs down the back of each leg. The nerve controls the muscles in the lower leg and in the back of the knee. It also provides feeling (sensation) to the back of the thigh, the lower leg, and the sole of the foot. Sciatica is a symptom of another medical condition that pinches or puts pressure on the sciatic nerve. Sciatica most often only affects one side of the body. Sciatica usually goes away on its own or with treatment. In some cases, sciatica may come back (recur). What are the causes? This condition is caused by pressure on the sciatic nerve or pinching of the nerve. This may be the result of: A disk in between the bones of the spine bulging out too far (herniated disk). Age-related changes in the spinal disks. A pain disorder that affects a muscle in the buttock. Extra bone growth near the sciatic nerve. A break (fracture) of the pelvis. Pregnancy. Tumor. This is rare. What increases the risk? The following factors may make you more likely to develop this condition: Playing sports that place pressure or stress on the spine. Having poor strength and flexibility. A history of back injury or surgery. Sitting for long periods of time. Doing activities that involve repetitive bending or lifting. Obesity. What are the signs or symptoms? Symptoms can vary from mild to very severe. They may include: Any of the following problems in the lower back, leg, hip, or buttock: Mild tingling, numbness, or dull aches. Burning sensations. Sharp pains. Numbness in the back of the calf or the sole of the foot. Leg weakness. Severe back pain that makes movement difficult. Symptoms may get worse when you cough, sneeze, or laugh, or when you sit or stand for long periods of time. How is this diagnosed? This condition may be diagnosed based on: Your symptoms  and medical history. A physical exam. Blood tests. Imaging tests, such as: X-rays. An MRI. A CT scan. How is this treated? In many cases, this condition improves on its own without treatment. However, treatment may include: Reducing or modifying physical activity. Exercising, including strengthening and stretching. Icing and applying heat to the affected area. Medicines that help to: Relieve pain and swelling. Relax your muscles. Injections of medicines that help to relieve pain and inflammation (steroids) around the sciatic nerve. Surgery. Follow these instructions at home: Medicines Take over-the-counter and prescription medicines only as told by your health care provider. Ask your health care provider if the medicine prescribed to you requires you to avoid driving or using heavy machinery. Managing pain     If directed, put ice on the affected area. To do this: Put ice in a plastic bag. Place a towel between your skin and the bag. Leave the ice on for 20 minutes, 2-3 times a day. If your skin turns bright red, remove the ice right away to prevent skin damage. The risk of skin damage is higher if you cannot feel pain, heat, or cold. If directed, apply heat to the affected area as often as told by your health care provider. Use the heat source that your health care provider recommends, such as a moist heat pack or a heating pad. Place a towel between your skin and the heat source. Leave the heat on for 20-30 minutes. If your skin turns bright red, remove the heat right away to prevent Gotschall. The   risk of Jansma is higher if you cannot feel pain, heat, or cold. Activity  Return to your normal activities as told by your health care provider. Ask your health care provider what activities are safe for you. Avoid activities that make your symptoms worse. Take brief periods of rest throughout the day. When you rest for longer periods, mix in some mild activity or stretching between  periods of rest. This will help to prevent stiffness and pain. Avoid sitting for long periods of time without moving. Get up and move around at least one time each hour. Exercise and stretch regularly as told by your health care provider. Do not lift anything that is heavier than 10 lb (4.5 kg) until your health care provider says that it is safe. When you do not have symptoms, you should still avoid heavy lifting, especially repetitive heavy lifting. When you lift objects, always use proper lifting technique, which includes: Bending your knees. Keeping the load close to your body. Avoiding twisting. General instructions Maintain a healthy weight. Excess weight puts extra stress on your back. Wear supportive, comfortable shoes. Avoid wearing high heels. Avoid sleeping on a mattress that is too soft or too hard. A mattress that is firm enough to support your back when you sleep may help to reduce your pain. Contact a health care provider if: Your pain is not controlled by medicine. Your pain does not improve or gets worse. Your pain lasts longer than 4 weeks. You have unexplained weight loss. Get help right away if: You are not able to control when you urinate or have bowel movements (incontinence). You have: Weakness in your lower back, pelvis, buttocks, or legs that gets worse. Redness or swelling of your back. A burning sensation when you urinate. Summary Sciatica is pain, numbness, weakness, or tingling along the path of the sciatic nerve, which may include the lower back, legs, hips, and buttocks. This condition is caused by pressure on the sciatic nerve or pinching of the nerve. Treatment often includes rest, exercise, medicines, and applying ice or heat. This information is not intended to replace advice given to you by your health care provider. Make sure you discuss any questions you have with your health care provider. Document Revised: 04/09/2021 Document Reviewed:  04/09/2021 Elsevier Patient Education  2024 [employer]  

## 2022-10-21 ENCOUNTER — Telehealth: Payer: Self-pay | Admitting: Emergency Medicine

## 2022-10-21 LAB — VITAMIN B1: Vitamin B1 (Thiamine): 16 nmol/L (ref 8–30)

## 2022-10-21 NOTE — Telephone Encounter (Signed)
No problem with me. 

## 2022-10-21 NOTE — Telephone Encounter (Signed)
Pt scheduled via mychart a new patient with Dr Veto Kemps, she is wanting to transfer her care from Dr Alvy Bimler to Dr Veto Kemps. She lives right behind our practice at [employer] [facility]. Please let me know if there is no issue.

## 2022-10-22 ENCOUNTER — Encounter: Payer: Self-pay | Admitting: Internal Medicine

## 2022-10-22 DIAGNOSIS — M51362 Other intervertebral disc degeneration, lumbar region with discogenic back pain and lower extremity pain: Secondary | ICD-10-CM | POA: Insufficient documentation

## 2022-10-23 MED ORDER — AMLODIPINE BESYLATE 5 MG PO TABS
5.0000 mg | ORAL_TABLET | Freq: Every day | ORAL | 0 refills | Status: DC
Start: 2022-10-23 — End: 2022-11-12

## 2022-11-01 ENCOUNTER — Encounter: Payer: Self-pay | Admitting: Physical Medicine & Rehabilitation

## 2022-11-07 ENCOUNTER — Encounter: Payer: Medicare Other | Admitting: Emergency Medicine

## 2022-11-08 ENCOUNTER — Encounter: Payer: Self-pay | Admitting: Physical Medicine & Rehabilitation

## 2022-11-08 ENCOUNTER — Encounter: Payer: Medicare Other | Attending: Physical Medicine & Rehabilitation | Admitting: Physical Medicine & Rehabilitation

## 2022-11-08 VITALS — BP 143/77 | HR 75 | Ht 67.0 in | Wt 179.0 lb

## 2022-11-08 DIAGNOSIS — M5416 Radiculopathy, lumbar region: Secondary | ICD-10-CM | POA: Insufficient documentation

## 2022-11-08 MED ORDER — PREDNISONE 10 MG (21) PO TBPK: ORAL_TABLET | Freq: Every day | ORAL | 1 refills | Status: DC

## 2022-11-08 NOTE — Progress Notes (Signed)
Subjective:    [name], female    DOB: 1945-09-14, 77 y.o.   MRN: [id number]  HPI 77 year old female kindly referred by internal medicine to evaluate pain going down the right lower extremity.  The patient indicates her pain goes down from the low back area to the hip area to the lateral and anterior thigh area to the knee to the lateral leg and then sometime into the great toe.  She notices some mild weakness in the right knee.  She has pain that is worse at night better when she hits it and sitting is also some what helpful unless it is prolonged.  She ambulates with crutches.  She can go up steps with the crutches.  She drives.  She is retired but does do some part-time work.  She has tried heat ice Voltaren gel IcyHot as well as lidocaine patches.  She takes 2 Tylenol every 8 hour which provides some partial relief.  She tries to exercise 3 times a week with walking but she has been unable to do this recently.  Her PHQ 9 score is 5, the patient has not been taking Flexeril or gabapentin.  She has not tried a steroid Dosepak.  Pain Inventory Average Pain 9 Pain Right Now 9 My pain is sharp, burning, dull, stabbing, tingling, and aching  In the last 24 hours, has pain interfered with the following? General activity 10 Relation with others 10 Enjoyment of life 10 What TIME of day is your pain at its worst? morning  and night Sleep (in general) Poor  Pain is worse with: walking, bending, sitting, and standing Pain improves with:  heat Relief from Meds: 0  walk with assistance use a walker ability to climb steps?  no do you drive?  yes Do you have any goals in this area?  yes  retired  weakness numbness trouble walking anxiety  Any changes since last visit?  no  Any changes since last visit?  no    Family History  Problem Relation Age of Onset   Stroke Mother    Hypertension Mother    Heart disease Mother    Heart disease Father    Heart attack  Father    Cancer Sister    Breast cancer Sister    CAD Sister    Cancer Sister    CAD Sister    Lung cancer Sister    Cancer Brother    Heart disease Brother    Lung cancer Brother    Hypertension Daughter    Hypertension Other    Cancer Other        lung x 2   Colon cancer Neg Hx    Colon polyps Neg Hx    Esophageal cancer Neg Hx    Rectal cancer Neg Hx    Stomach cancer Neg Hx    Social History   Socioeconomic History   Marital status: Widowed    Spouse name: Not on file   Number of children: 3   Years of education: college   Highest education level: Not on file  Occupational History   Occupation: CSST    Employer: [employer]  Tobacco Use   Smoking status: Never   Smokeless tobacco: Never  Substance and Sexual Activity   Alcohol use: No    Comment: glass of wine-special occasions   Drug use: No   Sexual activity: Never    Birth control/protection: Abstinence  Other Topics Concern   Not  on file  Social History Narrative   Not on file   Social Determinants of Health   Financial Resource Strain: Not on file  Food Insecurity: Not on file  Transportation Needs: Not on file  Physical Activity: Not on file  Stress: Not on file  Social Connections: Not on file   Past Surgical History:  Procedure Laterality Date   ANAL RECTAL MANOMETRY N/A 03/14/2021   Procedure: ANO RECTAL MANOMETRY;  Surgeon: Tressia Danas, MD;  Location: [facility];  Service: Gastroenterology;  Laterality: N/A;   CERVICAL SPINE SURGERY  06/15/2010   COLONOSCOPY  2005   normal   LEFT HEART CATH AND CORONARY ANGIOGRAPHY N/A 09/16/2017   Procedure: LEFT HEART CATH AND CORONARY ANGIOGRAPHY;  Surgeon: Orpah Cobb, MD;  Location: [facility];  Service: Cardiovascular;  Laterality: N/A;   Past Medical History:  Diagnosis Date   Allergy    Anemia    Arthritis    right knee   Carotid bruit 10/11/2019   Carotid stenosis 10/11/2019   Cataract    forming both   Chronic  kidney disease    GERD (gastroesophageal reflux disease)    past hx   Goiter    Hyperlipidemia    Hypertension    Lower extremity edema 10/11/2019   Menopause age 25   Pure hypercholesterolemia 10/11/2019   Reflux    Shortness of breath 12/21/2020   Spinal stenosis    There were no vitals taken for this visit.  Opioid Risk Score:   Fall Risk Score:  `1  Depression screen PHQ 2/9     11/05/2021    1:06 PM 10/18/2020    2:48 PM 10/26/2019   10:19 AM 03/17/2019    8:10 AM 01/18/2019    3:38 PM 10/21/2018    8:15 AM 10/16/2017   10:58 AM  Depression screen PHQ 2/9  Decreased Interest 0 0 0 0 0 0 0  Down, Depressed, Hopeless 0 0 0 0 0 0 0  PHQ - 2 Score 0 0 0 0 0 0 0    Review of Systems  Gastrointestinal:  Positive for constipation.  Musculoskeletal:  Positive for gait problem.  Neurological:  Positive for tremors, weakness and numbness.       Tingling  Psychiatric/Behavioral:         Anxiety  All other systems reviewed and are negative.      Objective:   Physical Exam  General No acute distress Mood and affect appropriate Neck has normal range of motion Upper extremity strength is 5/5 in the deltoid bicep tricep grip Upper extremity range of motion is normal Lower extremity strength is 5/5 in the hip flexor knee extensor ankle dorsiflexor plantar flexor On the right side the patient does have some pain with ankle dorsiflexion that limits full effort. Negative straight leg raising on the left side positive straight leg raising on the right side Sensation reduced right great toe as well as lateral leg and anterior thigh.  Normal sensation on the left side. Ambulates with crutches due to pain.  No evidence of toe drag.  Reviewed x-rays of the lumbar spine demonstrating multilevel degenerative disc L2-3 L3-4 L4-5 in particular greater than L5-S1. Also reviewed the images from the abdominal and pelvic CT from 2 years ago.  She did show evidence of spinal stenosis due to  disc osteophyte complex at L3-4 and L4-5     Assessment & Plan:   1.  Right lumbar radiculitis suspect due to spinal stenosis  she may have some foraminal stenosis causing radiculopathy.  At this point is unclear whether it is at multilevel.  Certainly she does have some evidence of L5 nerve root impingement and possibly L3 and L4.  Will need MRI to further evaluate.  Have ordered this.  The patient has already failed physician prescribed exercise program prescribed on 10/07/2022 Has already tried gabapentin Will trial prednisone dosepak return to clinic after MRI completed in approximately 4 weeks.  We discussed probable need for epidural steroid injection

## 2022-11-10 ENCOUNTER — Ambulatory Visit
Admission: RE | Admit: 2022-11-10 | Discharge: 2022-11-10 | Disposition: A | Discharge status: Home / Self Care | Payer: Medicare Other | Source: Ambulatory Visit | Attending: Physical Medicine & Rehabilitation | Admitting: Physical Medicine & Rehabilitation

## 2022-11-10 DIAGNOSIS — M5416 Radiculopathy, lumbar region: Secondary | ICD-10-CM

## 2022-11-12 ENCOUNTER — Ambulatory Visit: Payer: Medicare Other | Admitting: Family Medicine

## 2022-11-12 ENCOUNTER — Encounter: Payer: Self-pay | Admitting: Family Medicine

## 2022-11-12 VITALS — BP 118/72 | HR 72 | Temp 97.4°F | Ht 67.0 in | Wt 177.2 lb

## 2022-11-12 DIAGNOSIS — N1832 Chronic kidney disease, stage 3b: Secondary | ICD-10-CM | POA: Diagnosis not present

## 2022-11-12 DIAGNOSIS — Z23 Encounter for immunization: Secondary | ICD-10-CM

## 2022-11-12 DIAGNOSIS — D472 Monoclonal gammopathy: Secondary | ICD-10-CM

## 2022-11-12 DIAGNOSIS — Z789 Other specified health status: Secondary | ICD-10-CM | POA: Insufficient documentation

## 2022-11-12 DIAGNOSIS — I10 Essential (primary) hypertension: Secondary | ICD-10-CM | POA: Diagnosis not present

## 2022-11-12 DIAGNOSIS — E782 Mixed hyperlipidemia: Secondary | ICD-10-CM

## 2022-11-12 DIAGNOSIS — Z1231 Encounter for screening mammogram for malignant neoplasm of breast: Secondary | ICD-10-CM

## 2022-11-12 NOTE — Assessment & Plan Note (Signed)
Stable. Continue to follow with oncology.  

## 2022-11-12 NOTE — Assessment & Plan Note (Signed)
Blood pressure is in good control. Continue lisinopril 10 mg daily. I do not see a need for her to start amlodipine.

## 2022-11-12 NOTE — Progress Notes (Signed)
[facility] [address] [name] [address] Dept: [phone] Dept Fax: [phone]  Transfer of Care Office Visit  Subjective:    [name], female    DOB: 1945/05/13, 77 y.o..   MRN: [id number]  Chief Complaint  Patient presents with   Establish Care    NP- establish care.      History of Present Illness:  Patient is in today to establish care. Jo [name] was born in [place], [name].  Jo [name] was one of 13 siblings, 9 of which survived past childhood. She attended [employer] for 2 years studying childcare. 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 Jo [name] has been a widow for 22 years. She has three natural children (61, 59, 55), 4 step-children, 25 grandchildren,a dn 5 great-grandchildren. She denies use of tobacco, alcohol, or drugs.  Jo [name] has a history of hypertension. She is managed on lisinopril 10 mg daily. She notes she was prescribed amlodipine after an encounter she had where her BP was elevated. However, she notes she was in significant pain that day form her back. She feels this impacted her BP and notes she never has taken the amlodipine.  Jo [name] has a history of hyperlipidemia and carotid stenosis. She takes a daily aspirin and is on Repatha. She has had prior intolerance of statins.  Jo [name] has a history of Stage 3 b CKD. She is managed on dapagliflozin 10 mg daily. She is followed by Dr. Marisue Humble (nephrology).  Jo [name] has a history of a monoclonal gammopathy of undetermined significance. (MGUS). She is followed by oncology. She has had some mild associated anemia.  Jo [name] has a history of low back pain and right leg pain with numbness and tingling. She sees Dr. Wynn Banker who feels this is likely due to a radiculopathy due to lumbar degenerative disease and spinal stenosis.  He recently prescribed a course of prednisone, but she has not started this yet.  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 extremity pain 10/22/2022   Right lumbar radiculitis 10/17/2022   Stage 3b chronic kidney disease (HCC) 11/05/2021   Constipation due to outlet dysfunction    Deficiency anemia 01/30/2021   MGUS (monoclonal gammopathy of unknown significance) 10/18/2020   Unilateral primary osteoarthritis, right knee 05/24/2020   Hyperlipidemia 10/11/2019   Carotid stenosis 10/11/2019   Pre-diabetes 10/25/2014   Essential hypertension    Past Surgical History:  Procedure Laterality Date   ANAL RECTAL MANOMETRY N/A 03/14/2021   Procedure: ANO RECTAL MANOMETRY;  Surgeon: Tressia Danas, MD;  Location: [facility];  Service: Gastroenterology;  Laterality: N/A;   CERVICAL SPINE SURGERY  06/15/2010   COLONOSCOPY  2005   normal   LEFT HEART CATH AND CORONARY ANGIOGRAPHY N/A 09/16/2017   Procedure: LEFT HEART CATH AND CORONARY ANGIOGRAPHY;  Surgeon: Orpah Cobb, MD;  Location: [facility];  Service: Cardiovascular;  Laterality: N/A;   Family History  Problem Relation Age of Onset   Stroke Mother    Hypertension Mother    Heart disease Mother    Heart disease Father    Heart attack Father    Heart disease Sister    Cancer Sister  Lung   Kidney disease Sister    Cancer Brother        Lung   Cirrhosis Brother    Stroke Brother    Heart disease Brother    Heart disease Brother    Hypertension Daughter    Colon cancer Neg Hx    Colon polyps Neg Hx    Esophageal cancer Neg Hx    Rectal cancer Neg Hx    Stomach cancer Neg Hx    Outpatient Medications Prior to Visit  Medication Sig Dispense Refill   acetaminophen (TYLENOL) 325 MG tablet Take 325 mg by mouth every 6 (six) hours as needed for moderate pain.      aspirin EC 81 MG tablet Take 81 mg by mouth at bedtime.     Cholecalciferol (DIALYVITE VITAMIN D 5000) 125 MCG (5000 UT) capsule Take 5,000 Units by mouth at bedtime.     Evolocumab (REPATHA SURECLICK) 140 MG/ML SOAJ INJECT 1 ML SUBCUTANEOUSLY  EVERY TWO WEEKS 6 mL 3   FARXIGA 10 MG TABS tablet Take 10 mg by mouth daily.     Ferrous Sulfate (IRON SLOW RELEASE PO) Take 1 tablet by mouth daily.     lisinopril (ZESTRIL) 10 MG tablet Take 10 mg by mouth daily.     polyethylene glycol (MIRALAX / GLYCOLAX) packet Take 17 g by mouth daily as needed (constipation). Mix in 8 oz liquid and drink     predniSONE (STERAPRED UNI-PAK 21 TAB) 10 MG (21) TBPK tablet Take by mouth daily. 4 tablets by mouth every morning for 3 days then 3 tablets by mouth every morning for 3 days then 2 tablets by mouth every morning for 3 days then 1 tablet by mouth every morning for 3 days then Stop 70 tablet 1   triamcinolone cream (KENALOG) 0.1 % Apply 1 Application topically 2 (two) times daily. 30 g 0   vitamin B-12 (CYANOCOBALAMIN) 1000 MCG tablet Take 1,000 mcg by mouth at bedtime.      amLODipine (NORVASC) 5 MG tablet Take 1 tablet (5 mg total) by mouth daily. 90 tablet 0   cyclobenzaprine (FLEXERIL) 5 MG tablet Take 1 tablet (5 mg total) by mouth at bedtime as needed for muscle spasms. (Patient not taking: Reported on 11/08/2022) 5 tablet 0   gabapentin (NEURONTIN) 100 MG capsule Take 1 capsule (100 mg total) by mouth 3 (three) times daily. (Patient not taking: Reported on 11/08/2022) 90 capsule 0   lidocaine (LIDODERM) 5 % Place 1 patch onto the skin daily. Keep on the area for 12 hours then remove for 12 hours (Patient not taking: Reported on 11/12/2022) 10 patch 0   ondansetron (ZOFRAN) 4 MG tablet Take 1 tablet (4 mg total) by mouth every 8 (eight) hours as needed for nausea or vomiting. 10 tablet 1   No facility-administered medications prior to visit.   Allergies  Allergen Reactions   Demerol Nausea And  Vomiting    Severe nausea and vomiting   Crestor [Rosuvastatin]     Fatigue. Sob, myalgias, restless legs   Lipitor [Atorvastatin]     myalgias      Objective:   Today's Vitals   11/12/22 1328  BP: 118/72  Pulse: 72  Temp: (!) 97.4 F (36.3 C)  TempSrc: Temporal  SpO2: 100%  Weight: 177 lb 3.2 oz (80.4 kg)  Height: 5\' 7"  (1.702 m)   Body mass index is 27.75 kg/m.   General: Well developed, well nourished. No acute distress. Psych: Alert and oriented.  Normal mood and affect.  Health Maintenance Due  Topic Date Due   DTaP/Tdap/Td (2 - Td or Tdap) 04/15/2019   Medicare Annual Wellness (AWV)  03/16/2020   Lab Results    Latest Ref Rng & Units 06/21/2022   12:48 PM 12/27/2021    1:00 PM 11/14/2021    1:13 PM  CBC  WBC 4.0 - 10.5 K/uL 7.4  8.6  7.8   Hemoglobin 12.0 - 15.0 g/dL 13.0  86.5  78.4   Hematocrit 36.0 - 46.0 % 32.8  33.4  37.0   Platelets 150 - 400 K/uL 243  225  220        Latest Ref Rng & Units 10/17/2022    4:16 PM 06/21/2022   12:48 PM 08/15/2021   12:41 PM  CMP  Glucose 70 - 99 mg/dL 91  90  88   BUN 6 - 23 mg/dL 32  41  36   Creatinine 0.40 - 1.20 mg/dL 6.96  2.95  2.84   Sodium 135 - 145 mEq/L 138  138  139   Potassium 3.5 - 5.1 mEq/L 4.4  4.4  4.2   Chloride 96 - 112 mEq/L 103  106  105   CO2 19 - 32 mEq/L 31  26  31    Calcium 8.4 - 10.5 mg/dL 13.2  9.8  9.4   Total Protein 6.5 - 8.1 g/dL  7.7  7.1   Total Bilirubin 0.3 - 1.2 mg/dL  0.4  0.3   Alkaline Phos 38 - 126 U/L  61  66   AST 15 - 41 U/L  15  15   ALT 0 - 44 U/L  9  9    Last lipids Lab Results  Component Value Date   CHOL 204 (H) 10/17/2022   HDL 61.40 10/17/2022   LDLCALC 113 (H) 10/17/2022   TRIG 151.0 (H) 10/17/2022   CHOLHDL 3 10/17/2022   Last hemoglobin A1c Lab Results  Component Value Date   HGBA1C 5.9 (H) 12/28/2020  Last vitamin B12 and Folate Lab Results  Component Value Date   VITAMINB12 1,343 (H) 06/21/2022   FOLATE 11.8 06/21/2022      Assessment & Plan:    Problem List Items Addressed This Visit       Cardiovascular and Mediastinum   Essential hypertension - Primary    Blood pressure is in good control. Continue lisinopril 10 mg daily. I do not see a need for her to start amlodipine.        Genitourinary   Stage 3b chronic kidney disease (HCC)    Continue focus on blood pressure control, adequate hydration, and avoidance of nephrotoxic medications. She will also continue dapagliflozin 10 mg daily.         Other   Hyperlipidemia    Stable. On Repatha every 2 weeks.      MGUS (monoclonal gammopathy of unknown significance)    Stable. Continue to follow with oncology.      Other Visit Diagnoses     Need for immunization against influenza       Relevant Orders   Flu Vaccine Trivalent High Dose (Fluad) (Completed)   Encounter for screening mammogram for malignant neoplasm of breast       Relevant Orders   MM DIGITAL SCREENING BILATERAL       Return in about 3 months (around 02/12/2023) for Reassessment.   Loyola Mast, MD

## 2022-11-12 NOTE — Assessment & Plan Note (Signed)
Stable. On Repatha every 2 weeks.

## 2022-11-12 NOTE — Assessment & Plan Note (Signed)
Continue focus on blood pressure control, adequate hydration, and avoidance of nephrotoxic medications. She will also continue dapagliflozin 10 mg daily.

## 2022-11-14 ENCOUNTER — Other Ambulatory Visit: Payer: Self-pay

## 2022-11-14 ENCOUNTER — Telehealth: Payer: Self-pay

## 2022-11-14 NOTE — Telephone Encounter (Signed)
Patient called stating that she was suppose to have prescription sent in for prednisone to [facility] but she has been there numerous of times and no prescription has been sent. According to her chart the prescription was phoned in on 11/08/22 but no pharmacy was listed.  I called in the prescription to [facility].

## 2022-12-10 ENCOUNTER — Encounter: Payer: Self-pay | Admitting: Physical Medicine & Rehabilitation

## 2022-12-10 ENCOUNTER — Encounter: Payer: Medicare Other | Attending: Physical Medicine & Rehabilitation | Admitting: Physical Medicine & Rehabilitation

## 2022-12-10 VITALS — BP 130/76 | HR 66 | Ht 67.0 in | Wt 177.2 lb

## 2022-12-10 DIAGNOSIS — M5416 Radiculopathy, lumbar region: Secondary | ICD-10-CM | POA: Insufficient documentation

## 2022-12-10 NOTE — Progress Notes (Signed)
Subjective:    [name], female    DOB: Jul 29, 1945, 77 y.o.   MRN: [id number] 77 year old female kindly referred by internal medicine to evaluate pain going down the right lower extremity. The patient indicates her pain goes down from the low back area to the hip area to the lateral and anterior thigh area to the knee to the lateral leg and then sometime into the great toe. She notices some mild weakness in the right knee. She has pain that is worse at night better when she hits it and sitting is also some what helpful unless it is prolonged. She ambulates with crutches. She can go up steps with the crutches. She drives. She is retired but does do some part-time work. She has tried heat ice Voltaren gel IcyHot as well as lidocaine patches. She takes 2 Tylenol every 8 hour which provides some partial relief. She tries to exercise 3 times a week with walking but she has been unable to do this recently. Her PHQ 9 score is 5, the patient has not been taking Flexeril or gabapentin. She has not tried a steroid Dosepak.   HPI 77 year old female with chronic low back pain that has been occurring since the beginning of September.  The patient was seen by urgent care and then by PCP before referral to physical medicine and rehabilitation clinic.  She has a history of chronic kidney disease and unable to take nonsteroidal anti-inflammatories.  She has tried muscle relaxers as well as gabapentin prescribed by urgent care as well as PCP as well as OTC Tylenol the patient has had moderate pain 5 or 6 out of 10 but occasionally up to 8 out of 10.  She has pain that radiates into her right lower extremity with tingling in the right big toe.  The patient has been unable to perform her usual 3 times per week walking program.   She had temporary benefit from Medrol Dosepak prescribed at the last clinic visit approximately 1 month ago but this made her feel funny  The patient is here to review results of  MRI of the lumbar spine performed as below  CLINICAL DATA:  Low back pain radiating down the right leg since September   EXAM: MRI LUMBAR SPINE WITHOUT CONTRAST   TECHNIQUE: Multiplanar, multisequence MR imaging of the lumbar spine was performed. No intravenous contrast was administered.   COMPARISON:  Lumbar x-ray 10/17/2022   FINDINGS: Segmentation:  Standard.   Alignment:  Physiologic.   Vertebrae: No acute fracture, evidence of discitis, or aggressive bone lesion.   Conus medullaris and cauda equina: Conus extends to the L1 level. Conus and cauda equina appear normal.   Paraspinal and other soft tissues: No acute paraspinal abnormality.   Disc levels:   Disc spaces: Moderate disc height loss at L2-3, L3-4, and L4-5. Disc desiccation at T11-12 and L5-S1.   T12-L1: Mild disc bulge with a small central disc protrusion and caudal migration of disc material. Mild bilateral facet arthropathy. Mild bilateral foraminal narrowing.   L1-L2: Mild disc bulge. Moderate bilateral facet arthropathy. Moderate left foraminal stenosis. Mild right foraminal stenosis. No central canal stenosis.   L2-L3: Mild disc bulge. Moderate bilateral facet arthropathy. Mild central canal stenosis. Mild bilateral foraminal stenosis.   L3-L4: Mild disc osteophyte complex. Mild bilateral facet arthropathy. Mild central canal stenosis and bilateral lateral recess stenosis. Mild bilateral foraminal stenosis.   L4-L5: Mild disc osteophyte complex. Mild bilateral facet arthropathy. Mild central canal  stenosis and bilateral lateral recess stenosis. Mild right and moderate left foraminal stenosis.   L5-S1: Mild disc bulge. Moderate bilateral facet arthropathy. Mild bilateral lateral recess stenosis. Moderate-severe bilateral foraminal stenosis.   IMPRESSION: 1. Diffuse lumbar spine spondylosis as described above. 2. No acute osseous injury of the lumbar spine.     Electronically Signed   By:  Elige Ko M.D.   On: 12/02/2022 14:53 Pain Inventory Average Pain 6 Pain Right Now 5 My pain is intermittent, constant, burning, dull, tingling, and aching  In the last 24 hours, has pain interfered with the following? General activity 5 Relation with others 5 Enjoyment of life 8 What TIME of day is your pain at its worst? morning  and daytime Sleep (in general) Fair  Pain is worse with: walking, bending, inactivity, standing, and some activites Pain improves with: rest, heat/ice, therapy/exercise, pacing activities, and medication Relief from Meds: 9  Family History  Problem Relation Age of Onset   Stroke Mother    Hypertension Mother    Heart disease Mother    Heart disease Father    Heart attack Father    Heart disease Sister    Cancer Sister        Lung   Kidney disease Sister    Cancer Brother        Lung   Cirrhosis Brother    Stroke Brother    Heart disease Brother    Heart disease Brother    Hypertension Daughter    Colon cancer Neg Hx    Colon polyps Neg Hx    Esophageal cancer Neg Hx    Rectal cancer Neg Hx    Stomach cancer Neg Hx    Social History   Socioeconomic History   Marital status: Widowed    Spouse name: Not on file   Number of children: 3   Years of education: Not on file   Highest education level: Some college, no degree  Occupational History   Occupation: Comptroller: [employer]  Tobacco Use   Smoking status: Never   Smokeless tobacco: Never  Vaping Use   Vaping status: Never Used  Substance and Sexual Activity   Alcohol use: No    Comment: glass of wine-special occasions   Drug use: No   Sexual activity: Never    Birth control/protection: Abstinence  Other Topics Concern   Not on file  Social History Narrative   Not on file   Social Determinants of Health   Financial Resource Strain: Not on file  Food Insecurity: Not on file  Transportation Needs: Not on file  Physical Activity: Not on file  Stress: Not on  file  Social Connections: Not on file   Past Surgical History:  Procedure Laterality Date   ANAL RECTAL MANOMETRY N/A 03/14/2021   Procedure: ANO RECTAL MANOMETRY;  Surgeon: Tressia Danas, MD;  Location: [facility];  Service: Gastroenterology;  Laterality: N/A;   CERVICAL SPINE SURGERY  06/15/2010   COLONOSCOPY  2005   normal   LEFT HEART CATH AND CORONARY ANGIOGRAPHY N/A 09/16/2017   Procedure: LEFT HEART CATH AND CORONARY ANGIOGRAPHY;  Surgeon: Orpah Cobb, MD;  Location: [facility];  Service: Cardiovascular;  Laterality: N/A;   Past Surgical History:  Procedure Laterality Date   ANAL RECTAL MANOMETRY N/A 03/14/2021   Procedure: ANO RECTAL MANOMETRY;  Surgeon: Tressia Danas, MD;  Location: [facility];  Service: Gastroenterology;  Laterality: N/A;   CERVICAL SPINE SURGERY  06/15/2010  COLONOSCOPY  2005   normal   LEFT HEART CATH AND CORONARY ANGIOGRAPHY N/A 09/16/2017   Procedure: LEFT HEART CATH AND CORONARY ANGIOGRAPHY;  Surgeon: Orpah Cobb, MD;  Location: [facility];  Service: Cardiovascular;  Laterality: N/A;   Past Medical History:  Diagnosis Date   Allergy    Anemia    Arthritis    right knee   Carotid bruit 10/11/2019   Carotid stenosis 10/11/2019   Cataract    forming both   Chronic kidney disease    GERD (gastroesophageal reflux disease)    past hx   Goiter    Hyperlipidemia    Hypertension    Lower extremity edema 10/11/2019   Menopause age 49   Pure hypercholesterolemia 10/11/2019   Reflux    Shortness of breath 12/21/2020   Spinal stenosis    Ht 5\' 7"  (1.702 m)   Wt 177 lb 3.2 oz (80.4 kg)   BMI 27.75 kg/m   Opioid Risk Score:   Fall Risk Score:  `1  Depression screen PHQ 2/9     11/12/2022    1:41 PM 11/08/2022    9:06 AM 11/05/2021    1:06 PM 10/18/2020    2:48 PM 10/26/2019   10:19 AM 03/17/2019    8:10 AM 01/18/2019    3:38 PM  Depression screen PHQ 2/9  Decreased Interest 0 0 0 0 0 0 0  Down, Depressed,  Hopeless 0 0 0 0 0 0 0  PHQ - 2 Score 0 0 0 0 0 0 0  Altered sleeping  2       Tired, decreased energy  2       Change in appetite  0       Feeling bad or failure about yourself   0       Trouble concentrating  0       Moving slowly or fidgety/restless  1       Suicidal thoughts  0       PHQ-9 Score  5         Review of Systems  Musculoskeletal:  Positive for gait problem.       Right hip, right knee, right shoulder  All other systems reviewed and are negative.      Objective:   Physical Exam General No acute distress Mood and affect appropriate Motor strength is 4/5 in the right EHL 5/5 left EHL 5/5 bilateral ankle dorsiflexor 5/5 bilateral knee extensor and hip flexor bilaterally. Ambulates without assist device no evidence of toe drag or knee instability Sensation intact pinprick bilateral L4 L5-S1 but does have some paresthesias in the L5 dermatomal distribution. Lumbar spine no tenderness palpation Negative straight leg raising       Assessment & Plan:  1.  Lumbar spinal stenosis multilevel.  Mainly has foraminal stenosis rather than central stenosis with lumbar facet arthropathy contributing.  The patient has right lower extremity symptomatology referable to the L5 dermatomal and myotomal distribution.  She has failed approximately 3 months of conservative care consisting of muscle relaxers oral steroids, gabapentin, Tylenol and is not a candidate for nonsteroidal anti-inflammatories.  She has not been able to do her walking program due to excess pain.  Will schedule for epidural steroid injection right paracentral translaminar at L5-S1.  Preprocedure form completed, she is not on any anticoagulants.  No allergy to contrast.

## 2022-12-13 ENCOUNTER — Inpatient Hospital Stay: Payer: Medicare Other | Attending: Internal Medicine

## 2022-12-13 ENCOUNTER — Other Ambulatory Visit: Payer: Medicare Other

## 2022-12-13 DIAGNOSIS — D472 Monoclonal gammopathy: Secondary | ICD-10-CM | POA: Diagnosis present

## 2022-12-13 DIAGNOSIS — D6489 Other specified anemias: Secondary | ICD-10-CM

## 2022-12-13 LAB — CBC WITH DIFFERENTIAL (CANCER CENTER ONLY)
Abs Immature Granulocytes: 0.02 10*3/uL (ref 0.00–0.07)
Basophils Absolute: 0 10*3/uL (ref 0.0–0.1)
Basophils Relative: 0 %
Eosinophils Absolute: 0.2 10*3/uL (ref 0.0–0.5)
Eosinophils Relative: 2 %
HCT: 35.7 % — ABNORMAL LOW (ref 36.0–46.0)
Hemoglobin: 11.4 g/dL — ABNORMAL LOW (ref 12.0–15.0)
Immature Granulocytes: 0 %
Lymphocytes Relative: 37 %
Lymphs Abs: 2.6 10*3/uL (ref 0.7–4.0)
MCH: 30 pg (ref 26.0–34.0)
MCHC: 31.9 g/dL (ref 30.0–36.0)
MCV: 93.9 fL (ref 80.0–100.0)
Monocytes Absolute: 0.7 10*3/uL (ref 0.1–1.0)
Monocytes Relative: 10 %
Neutro Abs: 3.6 10*3/uL (ref 1.7–7.7)
Neutrophils Relative %: 51 %
Platelet Count: 196 10*3/uL (ref 150–400)
RBC: 3.8 MIL/uL — ABNORMAL LOW (ref 3.87–5.11)
RDW: 14.4 % (ref 11.5–15.5)
WBC Count: 7 10*3/uL (ref 4.0–10.5)
nRBC: 0 % (ref 0.0–0.2)

## 2022-12-14 LAB — KAPPA/LAMBDA LIGHT CHAINS
Kappa free light chain: 27 mg/L — ABNORMAL HIGH (ref 3.3–19.4)
Kappa, lambda light chain ratio: 1.36 (ref 0.26–1.65)
Lambda free light chains: 19.9 mg/L (ref 5.7–26.3)

## 2022-12-17 LAB — MULTIPLE MYELOMA PANEL, SERUM
Albumin SerPl Elph-Mcnc: 3.4 g/dL (ref 2.9–4.4)
Albumin/Glob SerPl: 1.2 (ref 0.7–1.7)
Alpha 1: 0.2 g/dL (ref 0.0–0.4)
Alpha2 Glob SerPl Elph-Mcnc: 0.7 g/dL (ref 0.4–1.0)
B-Globulin SerPl Elph-Mcnc: 0.9 g/dL (ref 0.7–1.3)
Gamma Glob SerPl Elph-Mcnc: 1.1 g/dL (ref 0.4–1.8)
Globulin, Total: 2.9 g/dL (ref 2.2–3.9)
IgA: 183 mg/dL (ref 64–422)
IgG (Immunoglobin G), Serum: 1206 mg/dL (ref 586–1602)
IgM (Immunoglobulin M), Srm: 69 mg/dL (ref 26–217)
M Protein SerPl Elph-Mcnc: 0.3 g/dL — ABNORMAL HIGH
Total Protein ELP: 6.3 g/dL (ref 6.0–8.5)

## 2022-12-20 ENCOUNTER — Ambulatory Visit: Payer: Medicare Other | Admitting: Oncology

## 2022-12-20 ENCOUNTER — Ambulatory Visit: Payer: Medicare Other

## 2022-12-20 NOTE — Progress Notes (Unsigned)
[facility] CONSULT NOTE  Patient Care Team: Loyola Mast, MD as PCP - General (Family Medicine) [name], Casimiro Needle, MD (Endocrinology) Charolett Bumpers, MD (Gastroenterology) Colon Branch, MD (Inactive) (Neurosurgery) Chilton Si, MD as Attending Physician (Cardiology) Arita Miss, MD as Attending Physician (Nephrology) Wynn Banker Victorino Sparrow, MD as Consulting Physician (Physical Medicine and Rehabilitation) Loni Muse, MD as Consulting Physician (Oncology)  ASSESSMENT & PLAN:  77 y.o.female with history of degenerative disc disease, osteoarthritis, GERD, CKD, HTN, HLD, arthritis referred to [facility] for abnormal SPEP.  Discussed with patient what MGUS is vs SMM and MM. Recommend additional testing for evaluation. To rule out multiple myeloma, I recommend complete blood work, 24 hour urine collection for UPEP and skeletal survey to rule out multiple myeloma Depending on test results, we may or may not proceed with bone marrow aspirate and biopsy.  Patient is fine with proceeding with bone marrow biopsy if needed.  If testing showing MGUS we will need long term monitoring.  If testing showed active myeloma, treatment will be discussed.  Patient will return after testing results available to discuss follow up plan. MGUS (monoclonal gammopathy of unknown significance) Stable follow-up November 2024. Repeat in about 4 months with CBC, CMP, LDH, immunoglobulin panel, SPEP and free light chain 2 weeks before visit  Normocytic anemia Stable Adequate level of B12, folate, ferritin Haptoglobin, bilirubin, negative DAT  Orders Placed This Encounter  Procedures   CBC with Differential (Cancer Center Only)    Standing Status:   Future    Standing Expiration Date:   12/24/2023   CMP (Cancer Center only)    Standing Status:   Future    Standing Expiration Date:   12/24/2023   Lactate dehydrogenase    Standing Status:   Future     Standing Expiration Date:   12/24/2023   Kappa/lambda light chains    Standing Status:   Future    Standing Expiration Date:   12/24/2023   Multiple Myeloma Panel (SPEP&IFE w/QIG)    Standing Status:   Future    Standing Expiration Date:   06/24/2023    All questions were answered. The patient knows to call the clinic with any problems, questions or concerns.     Melven Sartorius, MD 12/24/22 9:31 AM  CHIEF COMPLAINTS/PURPOSE OF CONSULTATION:  MGUS  HISTORY OF PRESENTING ILLNESS:  Jo [name] 77 y.o. female is here because of MGUS.  MGUS diagnosed in 2022.  She was found to have IgG kappa with M spike of 0.2 g/dL without evidence of endorgan damage to suggest multiple myeloma.   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   June 21 2022:  Scheduled follow up for MGUS. M-protein 0.4 g/dL. Immunofixation shows IgG monoclonal protein with kappa light chain specificity. Kappa 42. Lambda 29. K/L 1.49  12/13/22 K 27. M-protein 0.3 g/dL. Immunofixation shows IgG monoclonal protein with kappa light chain specificity. L 19. K/L 1.36. hgb 11.4.  Normal IgG, IgM, IgA  She has chronic right hip pain and waiting to start injection. Report she was told having pinched nerve. She using heat and ice and able to manage most day. No new pain.   11/10/22 MRI L spine:  IMPRESSION: 1. Diffuse lumbar spine spondylosis as described above. 2. No acute osseous injury of the lumbar spine.  Denies loss of appetite, night sweats, difficulty urinating or abnormal weight loss.  MEDICAL HISTORY:  Past Medical History:  Diagnosis  Date   Allergy    Anemia    Arthritis    right knee   Carotid bruit 10/11/2019   Carotid stenosis 10/11/2019   Cataract    forming both   Chronic kidney disease    GERD (gastroesophageal reflux disease)    past hx   Goiter    Hyperlipidemia    Hypertension    Lower extremity edema 10/11/2019   Menopause age 50   Pure hypercholesterolemia  10/11/2019   Reflux    Shortness of breath 12/21/2020   Spinal stenosis     SURGICAL HISTORY: Past Surgical History:  Procedure Laterality Date   ANAL RECTAL MANOMETRY N/A 03/14/2021   Procedure: ANO RECTAL MANOMETRY;  Surgeon: Tressia Danas, MD;  Location: [facility];  Service: Gastroenterology;  Laterality: N/A;   CERVICAL SPINE SURGERY  06/15/2010   COLONOSCOPY  2005   normal   LEFT HEART CATH AND CORONARY ANGIOGRAPHY N/A 09/16/2017   Procedure: LEFT HEART CATH AND CORONARY ANGIOGRAPHY;  Surgeon: Orpah Cobb, MD;  Location: [facility];  Service: Cardiovascular;  Laterality: N/A;    SOCIAL HISTORY: Social History   Socioeconomic History   Marital status: Widowed    Spouse name: Not on file   Number of children: 3   Years of education: Not on file   Highest education level: Some college, no degree  Occupational History   Occupation: Comptroller: [employer]  Tobacco Use   Smoking status: Never   Smokeless tobacco: Never  Vaping Use   Vaping status: Never Used  Substance and Sexual Activity   Alcohol use: No    Comment: glass of wine-special occasions   Drug use: No   Sexual activity: Never    Birth control/protection: Abstinence  Other Topics Concern   Not on file  Social History Narrative   Not on file   Social Determinants of Health   Financial Resource Strain: Not on file  Food Insecurity: Not on file  Transportation Needs: Not on file  Physical Activity: Not on file  Stress: Not on file  Social Connections: Not on file  Intimate Partner Violence: Not on file    FAMILY HISTORY: Family History  Problem Relation Age of Onset   Stroke Mother    Hypertension Mother    Heart disease Mother    Heart disease Father    Heart attack Father    Heart disease Sister    Cancer Sister        Lung   Kidney disease Sister    Breast cancer Sister    Hypertension Daughter    Cancer Brother        Lung   Cirrhosis Brother    Stroke  Brother    Heart disease Brother    Heart disease Brother    Colon cancer Neg Hx    Colon polyps Neg Hx    Esophageal cancer Neg Hx    Rectal cancer Neg Hx    Stomach cancer Neg Hx     ALLERGIES:  is allergic to demerol, crestor [rosuvastatin], and lipitor [atorvastatin].  MEDICATIONS:  Current Outpatient Medications  Medication Sig Dispense Refill   acetaminophen (TYLENOL) 325 MG tablet Take 325 mg by mouth every 6 (six) hours as needed for moderate pain.     aspirin EC 81 MG tablet Take 81 mg by mouth at bedtime.     Cholecalciferol (DIALYVITE VITAMIN D 5000) 125 MCG (5000 UT) capsule Take 5,000 Units by mouth at bedtime.  Evolocumab (REPATHA SURECLICK) 140 MG/ML SOAJ INJECT 1 ML SUBCUTANEOUSLY  EVERY TWO WEEKS 6 mL 3   FARXIGA 10 MG TABS tablet Take 10 mg by mouth daily.     Ferrous Sulfate (IRON SLOW RELEASE PO) Take 1 tablet by mouth daily.     lisinopril (ZESTRIL) 10 MG tablet Take 10 mg by mouth daily.     polyethylene glycol (MIRALAX / GLYCOLAX) packet Take 17 g by mouth daily as needed (constipation). Mix in 8 oz liquid and drink     predniSONE (STERAPRED UNI-PAK 21 TAB) 10 MG (21) TBPK tablet Take by mouth daily. 4 tablets by mouth every morning for 3 days then 3 tablets by mouth every morning for 3 days then 2 tablets by mouth every morning for 3 days then 1 tablet by mouth every morning for 3 days then Stop 70 tablet 1   triamcinolone cream (KENALOG) 0.1 % Apply 1 Application topically 2 (two) times daily. 30 g 0   vitamin B-12 (CYANOCOBALAMIN) 1000 MCG tablet Take 1,000 mcg by mouth at bedtime.      No current facility-administered medications for this visit.    REVIEW OF SYSTEMS:   All relevant systems were reviewed with the patient and are negative.  PHYSICAL EXAMINATION: ECOG PERFORMANCE STATUS: 2 due to chronic arthritis and DDD  Vitals:   12/24/22 0846  BP: 131/64  Pulse: 63  Resp: 17  Temp: 98.3 F (36.8 C)  SpO2: 100%   Filed Weights   12/24/22  0846  Weight: 178 lb 11.2 oz (81.1 kg)    GENERAL: alert, no distress and comfortable SKIN: skin color is normal LYMPH:  no palpable lymphadenopathy in the cervical regions LUNGS: Effort normal, no respiratory distress.  Clear to auscultation bilaterally HEART: regular rate & rhythm and no lower extremity edema ABDOMEN: soft, non-tender and nondistended Musculoskeletal: no point tenderness  LABORATORY DATA:  I have reviewed the data as listed Lab Results  Component Value Date   WBC 7.0 12/13/2022   HGB 11.4 (L) 12/13/2022   HCT 35.7 (L) 12/13/2022   MCV 93.9 12/13/2022   PLT 196 12/13/2022    RADIOGRAPHIC STUDIES: I have personally reviewed the radiological images as listed and agreed with the findings in the report. No results found.

## 2022-12-20 NOTE — Assessment & Plan Note (Signed)
Stable, with anemia of chronic disease Adequate level of B12, folate, ferritin Haptoglobin, bilirubin, negative DAT

## 2022-12-20 NOTE — Assessment & Plan Note (Signed)
Stable follow-up November 2024. Repeat in about 4 months with CBC, CMP, LDH, immunoglobulin panel, SPEP and free light chain

## 2022-12-23 ENCOUNTER — Other Ambulatory Visit: Payer: Self-pay | Admitting: Family Medicine

## 2022-12-23 ENCOUNTER — Ambulatory Visit
Admission: RE | Admit: 2022-12-23 | Discharge: 2022-12-23 | Disposition: A | Discharge status: Home / Self Care | Payer: Medicare Other | Source: Ambulatory Visit | Attending: Family Medicine | Admitting: Family Medicine

## 2022-12-23 DIAGNOSIS — Z1231 Encounter for screening mammogram for malignant neoplasm of breast: Secondary | ICD-10-CM

## 2022-12-23 DIAGNOSIS — N1832 Chronic kidney disease, stage 3b: Secondary | ICD-10-CM

## 2022-12-23 DIAGNOSIS — Z23 Encounter for immunization: Secondary | ICD-10-CM

## 2022-12-23 DIAGNOSIS — E782 Mixed hyperlipidemia: Secondary | ICD-10-CM

## 2022-12-23 DIAGNOSIS — D472 Monoclonal gammopathy: Secondary | ICD-10-CM

## 2022-12-23 DIAGNOSIS — I10 Essential (primary) hypertension: Secondary | ICD-10-CM

## 2022-12-24 ENCOUNTER — Inpatient Hospital Stay: Payer: Medicare Other | Attending: Internal Medicine

## 2022-12-24 VITALS — BP 131/64 | HR 63 | Temp 98.3°F | Resp 17 | Wt 178.7 lb

## 2022-12-24 DIAGNOSIS — Z7982 Long term (current) use of aspirin: Secondary | ICD-10-CM | POA: Insufficient documentation

## 2022-12-24 DIAGNOSIS — Z79899 Other long term (current) drug therapy: Secondary | ICD-10-CM | POA: Diagnosis not present

## 2022-12-24 DIAGNOSIS — D631 Anemia in chronic kidney disease: Secondary | ICD-10-CM | POA: Insufficient documentation

## 2022-12-24 DIAGNOSIS — Z7984 Long term (current) use of oral hypoglycemic drugs: Secondary | ICD-10-CM | POA: Insufficient documentation

## 2022-12-24 DIAGNOSIS — D649 Anemia, unspecified: Secondary | ICD-10-CM | POA: Diagnosis not present

## 2022-12-24 DIAGNOSIS — N189 Chronic kidney disease, unspecified: Secondary | ICD-10-CM | POA: Diagnosis not present

## 2022-12-24 DIAGNOSIS — D472 Monoclonal gammopathy: Secondary | ICD-10-CM | POA: Diagnosis present

## 2022-12-24 DIAGNOSIS — I129 Hypertensive chronic kidney disease with stage 1 through stage 4 chronic kidney disease, or unspecified chronic kidney disease: Secondary | ICD-10-CM | POA: Insufficient documentation

## 2023-01-21 ENCOUNTER — Encounter: Payer: Medicare Other | Attending: Physical Medicine & Rehabilitation | Admitting: Physical Medicine & Rehabilitation

## 2023-01-21 ENCOUNTER — Encounter: Payer: Self-pay | Admitting: Physical Medicine & Rehabilitation

## 2023-01-21 VITALS — BP 127/73 | HR 46 | Temp 98.1°F | Ht 67.0 in | Wt 178.0 lb

## 2023-01-21 DIAGNOSIS — M5416 Radiculopathy, lumbar region: Secondary | ICD-10-CM

## 2023-01-21 MED ORDER — IOHEXOL 180 MG/ML  SOLN
3.0000 mL | Freq: Once | INTRAMUSCULAR | Status: AC
  Administered 2023-01-21: 3 mL

## 2023-01-21 MED ORDER — BETAMETHASONE SOD PHOS & ACET 6 (3-3) MG/ML IJ SUSP
9.0000 mg | Freq: Once | INTRAMUSCULAR | Status: AC
  Administered 2023-01-21: 9 mg

## 2023-01-21 NOTE — Progress Notes (Signed)
 RIght L5-S1 Translaminar Lumbar epidural steroid injection under fluoroscopic guidance  Indication: Lumbosacral radiculitis is not relieved by medication management or other conservative care and interfering with self-care and mobility.  No  anticoagulant use.  Informed consent was obtained after describing risk and benefits of the procedure with the patient, this includes bleeding, bruising, infection, paralysis and medication side effects.  The patient wishes to proceed and has given written consent.  Patient was placed in a prone position.  The lumbar area was marked and prepped with Betadine.  It was entered with a 25-gauge 1-1/2 inch needle and one mL of 1% lidocaine  was injected into the skin and subcutaneous tissue.  Then a 17-gauge 3.5 inch spinal needle was inserted under fluoroscopic guidance into the right L5-S1 interlaminar space under AP and Lateral imaging.  Once needle tip of approximated the posterior elements, a loss of resistance technique was utilized with lateral imaging.  A positive loss of resistance was obtained and then confirmed by injecting 2 mL's of Omnipaque  180.  Then a solution containing 1.5 mL's of 6mg /ml Celestone  and 1.5 mL's of 1% lidocaine  was injected.  The patient tolerated procedure well.  Post procedure instructions were given.  Please see post procedure form.  Omnipaque  180 2ml , 8ml waste Celestone  6mg  per ml 1.5 ml no waste Lidocaine  5ml MPF no waste

## 2023-01-21 NOTE — Progress Notes (Signed)
  PROCEDURE RECORD [facility]   Name: Candiace West DOB:1945/01/19 MRN: [id number]  Date:01/21/2023  Physician: Prentice Compton, MD    Nurse/CMA: Jama, CMA  Allergies:  Allergies  Allergen Reactions   Demerol Nausea And Vomiting    Severe nausea and vomiting   Crestor  [Rosuvastatin ]     Fatigue. Sob, myalgias, restless legs   Lipitor [Atorvastatin ]     myalgias    Consent Signed: Yes.    Is patient diabetic? Yes.    CBG today? .  Pregnant: No. LMP: No LMP recorded. Patient is postmenopausal. (age 41-55)  Anticoagulants: no Anti-inflammatory: no Antibiotics: no  Procedure: Right L5-S1 Translaminar  Position: Prone Start Time: 2:25 pm  End Time: 2:35 pm  Fluoro Time: 19  RN/CMA Jama, CMA Tiahna Cure, CMA    Time 1:46 pm 2:42 pm    BP 127/73 121/53    Pulse 46 40    Respirations 16 16    O2 Sat 98 98    S/S 6 6    Pain Level 9/10 5/10     D/C home with daughter, patient A & O X 3, D/C instructions reviewed, and sits independently.

## 2023-02-03 ENCOUNTER — Ambulatory Visit (INDEPENDENT_AMBULATORY_CARE_PROVIDER_SITE_OTHER): Payer: Medicare Other

## 2023-02-03 DIAGNOSIS — Z0000 Encounter for general adult medical examination without abnormal findings: Secondary | ICD-10-CM

## 2023-02-03 NOTE — Progress Notes (Signed)
Subjective:   Deresa Mulugeta is a 78 y.o. female who presents for Medicare Annual (Subsequent) preventive examination.  Visit Complete: Virtual I connected with  Marvell Fuller on 02/03/23 by a audio enabled telemedicine application and verified that I am speaking with the correct person using two identifiers. Interactive audio and video telecommunications were attempted between this provider and patient, however failed, due to patient having technical difficulties OR patient did not have access to video capability.  We continued and completed visit with audio only.  Patient Location: Home  Provider Location: Office/Clinic  I discussed the limitations of evaluation and management by telemedicine. The patient expressed understanding and agreed to proceed.  Vital Signs: Because this visit was a virtual/telehealth visit, some criteria may be missing or patient reported. Any vitals not documented were not able to be obtained and vitals that have been documented are patient reported.    Cardiac Risk Factors include: advanced age (>47men, >81 women);dyslipidemia;hypertension     Objective:    Today's Vitals   02/03/23 1616  PainSc: 5    There is no height or weight on file to calculate BMI.     02/03/2023    4:26 PM 10/07/2022    1:23 PM 12/13/2020    6:43 AM 03/17/2019    8:08 AM 09/15/2017   12:05 AM 12/25/2015    8:34 AM  Advanced Directives  Does Patient Have a Medical Advance Directive? No No No No No No  Would patient like information on creating a medical advance directive?  No - Patient declined Yes (MAU/Ambulatory/Procedural Areas - Information given) Yes (MAU/Ambulatory/Procedural Areas - Information given) No - Patient declined Yes (MAU/Ambulatory/Procedural Areas - Information given)    Current Medications (verified) Outpatient Encounter Medications as of 02/03/2023  Medication Sig   acetaminophen (TYLENOL) 325 MG tablet Take 325 mg by mouth every 6 (six) hours  as needed for moderate pain.   aspirin EC 81 MG tablet Take 81 mg by mouth at bedtime.   Cholecalciferol (DIALYVITE VITAMIN D 5000) 125 MCG (5000 UT) capsule Take 5,000 Units by mouth at bedtime.   Evolocumab (REPATHA SURECLICK) 140 MG/ML SOAJ INJECT 1 ML SUBCUTANEOUSLY  EVERY TWO WEEKS   Ferrous Sulfate (IRON SLOW RELEASE PO) Take 1 tablet by mouth daily.   lisinopril (ZESTRIL) 10 MG tablet Take 10 mg by mouth daily.   polyethylene glycol (MIRALAX / GLYCOLAX) packet Take 17 g by mouth daily as needed (constipation). Mix in 8 oz liquid and drink   triamcinolone cream (KENALOG) 0.1 % Apply 1 Application topically 2 (two) times daily.   vitamin B-12 (CYANOCOBALAMIN) 1000 MCG tablet Take 1,000 mcg by mouth at bedtime.    FARXIGA 10 MG TABS tablet Take 10 mg by mouth daily. (Patient not taking: Reported on 02/03/2023)   No facility-administered encounter medications on file as of 02/03/2023.    Allergies (verified) Demerol, Crestor [rosuvastatin], and Lipitor [atorvastatin]   History: Past Medical History:  Diagnosis Date   Allergy    Anemia    Arthritis    right knee   Carotid bruit 10/11/2019   Carotid stenosis 10/11/2019   Cataract    forming both   Chronic kidney disease    GERD (gastroesophageal reflux disease)    past hx   Goiter    Hyperlipidemia    Hypertension    Lower extremity edema 10/11/2019   Menopause age 44   Pure hypercholesterolemia 10/11/2019   Reflux    Shortness of breath 12/21/2020  Spinal stenosis    Past Surgical History:  Procedure Laterality Date   ANAL RECTAL MANOMETRY N/A 03/14/2021   Procedure: ANO RECTAL MANOMETRY;  Surgeon: Tressia Danas, MD;  Location: [facility];  Service: Gastroenterology;  Laterality: N/A;   CERVICAL SPINE SURGERY  06/15/2010   COLONOSCOPY  2005   normal   LEFT HEART CATH AND CORONARY ANGIOGRAPHY N/A 09/16/2017   Procedure: LEFT HEART CATH AND CORONARY ANGIOGRAPHY;  Surgeon: Orpah Cobb, MD;  Location: [facility];  Service: Cardiovascular;  Laterality: N/A;   Family History  Problem Relation Age of Onset   Stroke Mother    Hypertension Mother    Heart disease Mother    Heart disease Father    Heart attack Father    Heart disease Sister    Cancer Sister        Lung   Kidney disease Sister    Breast cancer Sister    Hypertension Daughter    Cancer Brother        Lung   Cirrhosis Brother    Stroke Brother    Heart disease Brother    Heart disease Brother    Colon cancer Neg Hx    Colon polyps Neg Hx    Esophageal cancer Neg Hx    Rectal cancer Neg Hx    Stomach cancer Neg Hx    Social History   Socioeconomic History   Marital status: Widowed    Spouse name: Not on file   Number of children: 3   Years of education: Not on file   Highest education level: Some college, no degree  Occupational History   Occupation: Comptroller: [employer]  Tobacco Use   Smoking status: Never   Smokeless tobacco: Never  Vaping Use   Vaping status: Never Used  Substance and Sexual Activity   Alcohol use: No    Comment: glass of wine-special occasions   Drug use: No   Sexual activity: Never    Birth control/protection: Abstinence  Other Topics Concern   Not on file  Social History Narrative   Not on file   Social Drivers of Health   Financial Resource Strain: Low Risk  (02/03/2023)   Overall Financial Resource Strain (CARDIA)    Difficulty of Paying Living Expenses: Not hard at all  Food Insecurity: No Food Insecurity (02/03/2023)   Hunger Vital Sign    Worried About Running Out of Food in the Last Year: Never true    Ran Out of Food in the Last Year: Never true  Transportation Needs: No Transportation Needs (02/03/2023)   PRAPARE - Administrator, Civil Service (Medical): No    Lack of Transportation (Non-Medical): No  Physical Activity: Inactive (02/03/2023)   Exercise Vital Sign    Days of Exercise per Week: 0 days    Minutes of Exercise per Session: 0 min   Stress: No Stress Concern Present (02/03/2023)   [employer] of Occupational Health - Occupational Stress Questionnaire    Feeling of Stress : Only a little  Social Connections: Moderately Isolated (02/03/2023)   Social Connection and Isolation Panel [NHANES]    Frequency of Communication with Friends and Family: More than three times a week    Frequency of Social Gatherings with Friends and Family: Once a week    Attends Religious Services: More than 4 times per year    Active Member of [employer] or Organizations: No    Attends Banker Meetings:  Never    Marital Status: Widowed    Tobacco Counseling Counseling given: Not Answered   Clinical Intake:  Pre-visit preparation completed: Yes  Pain : 0-10 Pain Score: 5  Pain Type: Chronic pain Pain Location: Hip Pain Orientation: Right Pain Descriptors / Indicators: Aching Pain Onset: More than a month ago Pain Frequency: Constant     Nutritional Risks: None Diabetes: No  How often do you need to have someone help you when you read instructions, pamphlets, or other written materials from your doctor or pharmacy?: 1 - Never  Interpreter Needed?: No  Information entered by :: NAllen LPN   Activities of Daily Living    02/03/2023    4:18 PM  In your present state of health, do you have any difficulty performing the following activities:  Hearing? 1  Comment decreased hearing in left ear sometimes  Vision? 0  Difficulty concentrating or making decisions? 0  Walking or climbing stairs? 1  Comment sometimes  Dressing or bathing? 0  Doing errands, shopping? 0  Preparing Food and eating ? N  Using the Toilet? N  In the past six months, have you accidently leaked urine? N  Do you have problems with loss of bowel control? N  Managing your Medications? N  Managing your Finances? N  Housekeeping or managing your Housekeeping? N    Patient Care Team: Loyola Mast, MD as PCP - General (Family  Medicine) [name], Casimiro Needle, MD (Endocrinology) Charolett Bumpers, MD (Gastroenterology) Colon Branch, MD (Inactive) (Neurosurgery) Chilton Si, MD as Attending Physician (Cardiology) Arita Miss, MD as Attending Physician (Nephrology) Wynn Banker Victorino Sparrow, MD as Consulting Physician (Physical Medicine and Rehabilitation) Loni Muse, MD as Consulting Physician (Oncology)  Indicate any recent Medical Services you may have received from other than [facility] providers in the past year (date may be approximate).     Assessment:   This is a routine wellness examination for [place].  Hearing/Vision screen Hearing Screening - Comments:: Decreased hearing in left ear Vision Screening - Comments:: Regular eye exams, [facility]   Goals Addressed             This Visit's Progress    Patient Stated       02/03/2023, wants to get hip in place with less pain and lose weight       Depression Screen    02/03/2023    4:29 PM 12/10/2022   11:07 AM 11/12/2022    1:41 PM 11/08/2022    9:06 AM 11/05/2021    1:06 PM 10/18/2020    2:48 PM 10/26/2019   10:19 AM  PHQ 2/9 Scores  PHQ - 2 Score 0 0 0 0 0 0 0  PHQ- 9 Score    5       Fall Risk    02/03/2023    4:27 PM 01/21/2023    1:43 PM 12/10/2022   11:07 AM 11/12/2022    1:41 PM 11/05/2021    1:06 PM  Fall Risk   Falls in the past year? 0 0 0 0 0  Number falls in past yr: 0  0 0 0  Injury with Fall? 0  0 0 0  Risk for fall due to : Medication side effect;Impaired mobility;Impaired balance/gait   No Fall Risks No Fall Risks  Follow up Falls prevention discussed;Falls evaluation completed   Falls evaluation completed Falls evaluation completed    MEDICARE RISK AT HOME: Medicare Risk at Home Any stairs in or around the home?:  Yes If so, are there any without handrails?: No Home free of loose throw rugs in walkways, pet beds, electrical cords, etc?: Yes Adequate lighting in your home to reduce risk of falls?:  Yes Life alert?: No Use of a cane, walker or w/c?: No Grab bars in the bathroom?: No Shower chair or bench in shower?: No Elevated toilet seat or a handicapped toilet?: Yes  TIMED UP AND GO:  Was the test performed?  No    Cognitive Function:        02/03/2023    4:30 PM 03/17/2019    8:08 AM  6CIT Screen  What Year? 0 points 0 points  What month? 0 points 0 points  What time? 0 points 0 points  Count back from 20 0 points 0 points  Months in reverse 0 points 0 points  Repeat phrase 2 points 0 points  Total Score 2 points 0 points    Immunizations Immunization History  Administered Date(s) Administered   Fluad Quad(high Dose 65+) 10/21/2018, 10/26/2019, 10/18/2020, 11/05/2021   Fluad Trivalent(High Dose 65+) 11/12/2022   Influenza Whole 01/14/2009   Influenza, High Dose Seasonal PF 10/16/2017   Influenza,inj,Quad PF,6+ Mos 11/02/2012, 11/22/2013, 01/25/2015, 12/13/2015, 11/13/2016   PFIZER(Purple Top)SARS-COV-2 Vaccination 02/18/2019, 03/11/2019, 02/11/2020   Pneumococcal Conjugate-13 11/22/2013   Pneumococcal Polysaccharide-23 04/14/2009   Tdap 04/14/2009   Zoster Recombinant(Shingrix) 10/31/2022    TDAP status: Due, Education has been provided regarding the importance of this vaccine. Advised may receive this vaccine at local pharmacy or Health Dept. Aware to provide a copy of the vaccination record if obtained from local pharmacy or Health Dept. Verbalized acceptance and understanding.  Flu Vaccine status: Up to date  Pneumococcal vaccine status: Up to date  Covid-19 vaccine status: Information provided on how to obtain vaccines.   Qualifies for Shingles Vaccine? Yes   Zostavax completed No   Shingrix Completed?: needs second dose  Screening Tests Health Maintenance  Topic Date Due   DTaP/Tdap/Td (2 - Td or Tdap) 04/15/2019   COVID-19 Vaccine (4 - 2024-25 season) 09/15/2022   Zoster Vaccines- Shingrix (2 of 2) 12/26/2022   Medicare Annual Wellness (AWV)   02/03/2024   Pneumonia Vaccine 29+ Years old  Completed   INFLUENZA VACCINE  Completed   DEXA SCAN  Completed   Hepatitis C Screening  Completed   HPV VACCINES  Aged Out   Fecal DNA (Cologuard)  Discontinued    Health Maintenance  Health Maintenance Due  Topic Date Due   DTaP/Tdap/Td (2 - Td or Tdap) 04/15/2019   COVID-19 Vaccine (4 - 2024-25 season) 09/15/2022   Zoster Vaccines- Shingrix (2 of 2) 12/26/2022    Colorectal cancer screening: No longer required.   Mammogram status: Completed 12/23/2022. Repeat every year  Bone Density status: Completed 10/21/2018.   Lung Cancer Screening: (Low Dose CT Chest recommended if Age 59-80 years, 20 pack-year currently smoking OR have quit w/in 15years.) does not qualify.   Lung Cancer Screening Referral: no  Additional Screening:  Hepatitis C Screening: does qualify; Completed 10/24/2014  Vision Screening: Recommended annual ophthalmology exams for early detection of glaucoma and other disorders of the eye. Is the patient up to date with their annual eye exam?  Yes  Who is the provider or what is the name of the office in which the patient attends annual eye exams? [facility] If pt is not established with a provider, would they like to be referred to a provider to establish care? No .  Dental Screening: Recommended annual dental exams for proper oral hygiene  Diabetic Foot Exam: n/a  Community Resource Referral / Chronic Care Management: CRR required this visit?  No   CCM required this visit?  No     Plan:     I have personally reviewed and noted the following in the patient's chart:   Medical and social history Use of alcohol, tobacco or illicit drugs  Current medications and supplements including opioid prescriptions. Patient is not currently taking opioid prescriptions. Functional ability and status Nutritional status Physical activity Advanced directives List of other physicians Hospitalizations, surgeries, and  ER visits in previous 12 months Vitals Screenings to include cognitive, depression, and falls Referrals and appointments  In addition, I have reviewed and discussed with patient certain preventive protocols, quality metrics, and best practice recommendations. A written personalized care plan for preventive services as well as general preventive health recommendations were provided to patient.     Barb Merino, LPN   1/61/0960   After Visit Summary: (Pick Up) Due to this being a telephonic visit, with patients personalized plan was offered to patient and patient has requested to Pick up at office.  Nurse Notes: none

## 2023-02-03 NOTE — Patient Instructions (Signed)
[name] , Thank you for taking time to come for your Medicare Wellness Visit. I appreciate your ongoing commitment to your health goals. Please review the following plan we discussed and let me know if I can assist you in the future.   Referrals/Orders/Follow-Ups/Clinician Recommendations: none  This is a list of the screening recommended for you and due dates:  Health Maintenance  Topic Date Due   DTaP/Tdap/Td vaccine (2 - Td or Tdap) 04/15/2019   COVID-19 Vaccine (4 - 2024-25 season) 09/15/2022   Zoster (Shingles) Vaccine (2 of 2) 12/26/2022   Medicare Annual Wellness Visit  02/03/2024   Pneumonia Vaccine  Completed   Flu Shot  Completed   DEXA scan (bone density measurement)  Completed   Hepatitis C Screening  Completed   HPV Vaccine  Aged Out   Cologuard (Stool DNA test)  Discontinued    Advanced directives: (ACP Link)Information on Advanced Care Planning can be found at [facility] Advance Health Care Directives Advance Health Care Directives ([email or web address]   Next Medicare Annual Wellness Visit scheduled for next year: Yes  insert Preventive Care attachment Insert FALL PREVENTION attachment if needed

## 2023-02-12 ENCOUNTER — Encounter: Payer: Self-pay | Admitting: Family Medicine

## 2023-02-12 ENCOUNTER — Ambulatory Visit: Payer: Medicare Other | Admitting: Family Medicine

## 2023-02-12 VITALS — BP 128/74 | HR 68 | Temp 98.0°F | Ht 67.0 in | Wt 178.0 lb

## 2023-02-12 DIAGNOSIS — H9042 Sensorineural hearing loss, unilateral, left ear, with unrestricted hearing on the contralateral side: Secondary | ICD-10-CM | POA: Diagnosis not present

## 2023-02-12 DIAGNOSIS — I10 Essential (primary) hypertension: Secondary | ICD-10-CM

## 2023-02-12 NOTE — Assessment & Plan Note (Signed)
Mild sensorineural loss on the left. Will monitor for now.

## 2023-02-12 NOTE — Assessment & Plan Note (Signed)
Blood pressure is in good control. Continue lisinopril 10 mg daily.

## 2023-02-12 NOTE — Progress Notes (Signed)
[facility] [address] [place] [address] Dept: [phone] Dept Fax: [phone]  Chronic Care Office Visit  Subjective:    [name], female    DOB: [birth year], 78 y.o..   MRN: [id number]  Chief Complaint  Patient presents with   Hypertension    3 month f/u.  C/o LT ear feels full    History of Present Illness:  Patient is in today for reassessment of chronic medical issues.  Jo [name] has a history of hypertension. She is managed on lisinopril 10 mg daily.    Jo [name] has a history of hyperlipidemia and carotid stenosis. She takes a daily aspirin and is on Repatha. She has had prior intolerance of statins.   Jo [name] has a history of Stage 3 b CKD. She is managed on dapagliflozin 10 mg daily. She is followed by Dr. Marisue Humble (nephrology).   Jo [name] has been noting some decreased hearing in her left ear. She wonders if she might have wax in her ear canal.  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   Unilateral primary osteoarthritis, right knee 05/24/2020   Hyperlipidemia 10/11/2019   Carotid stenosis 10/11/2019   Pre-diabetes 10/25/2014   Essential hypertension    Past Surgical History:  Procedure Laterality Date   ANAL RECTAL MANOMETRY N/A 03/14/2021   Procedure: ANO RECTAL MANOMETRY;  Surgeon: Tressia Danas, MD;  Location: [facility];  Service: Gastroenterology;  Laterality: N/A;    CERVICAL SPINE SURGERY  06/15/2010   COLONOSCOPY  2005   normal   LEFT HEART CATH AND CORONARY ANGIOGRAPHY N/A 09/16/2017   Procedure: LEFT HEART CATH AND CORONARY ANGIOGRAPHY;  Surgeon: Orpah Cobb, MD;  [facility];  Service: Cardiovascular;  Laterality: N/A;   Family History  Problem Relation Age of Onset   Stroke Mother    Hypertension Mother    Heart disease Mother    Heart disease Father    Heart attack Father    Heart disease Sister    Cancer Sister        Lung   Kidney disease Sister    Breast cancer Sister    Hypertension Daughter    Cancer Brother        Lung   Cirrhosis Brother    Stroke Brother    Heart disease Brother    Heart disease Brother    Colon cancer Neg Hx    Colon polyps Neg Hx    Esophageal cancer Neg Hx    Rectal cancer Neg Hx    Stomach cancer Neg Hx    Outpatient Medications Prior to Visit  Medication Sig Dispense Refill   acetaminophen (TYLENOL) 325 MG tablet Take 325 mg by mouth every 6 (six) hours as needed for moderate pain.     aspirin EC 81 MG tablet Take 81 mg by mouth at bedtime.     Cholecalciferol (DIALYVITE VITAMIN D 5000) 125 MCG (5000 UT) capsule Take 5,000 Units by mouth at bedtime.  Evolocumab (REPATHA SURECLICK) 140 MG/ML SOAJ INJECT 1 ML SUBCUTANEOUSLY  EVERY TWO WEEKS 6 mL 3   FARXIGA 10 MG TABS tablet Take 10 mg by mouth daily.     Ferrous Sulfate (IRON SLOW RELEASE PO) Take 1 tablet by mouth daily.     lisinopril (ZESTRIL) 10 MG tablet Take 10 mg by mouth daily.     polyethylene glycol (MIRALAX / GLYCOLAX) packet Take 17 g by mouth daily as needed (constipation). Mix in 8 oz liquid and drink     triamcinolone cream (KENALOG) 0.1 % Apply 1 Application topically 2 (two) times daily. 30 g 0   vitamin B-12 (CYANOCOBALAMIN) 1000 MCG tablet Take 1,000 mcg by mouth at bedtime.      No facility-administered medications prior to visit.   Allergies  Allergen Reactions   Demerol Nausea And Vomiting    Severe  nausea and vomiting   Crestor [Rosuvastatin]     Fatigue. Sob, myalgias, restless legs   Lipitor [Atorvastatin]     myalgias   Objective:   Today's Vitals   02/12/23 1358  BP: 128/74  Pulse: 68  Temp: 98 F (36.7 C)  TempSrc: Temporal  SpO2: 99%  Weight: 178 lb (80.7 kg)  Height: 5\' 7"  (1.702 m)   Body mass index is 27.88 kg/m.   General: Well developed, well nourished. No acute distress. HEENT: Normocephalic, non-traumatic. External ears normal. EAC and TMs normal bilaterally. Weber   test best heard on the right. Rinne test shows air > bone bilaterally. Psych: Alert and oriented. Normal mood and affect.  Health Maintenance Due  Topic Date Due   DTaP/Tdap/Td (2 - Td or Tdap) 04/15/2019   COVID-19 Vaccine (4 - 2024-25 season) 09/15/2022   Zoster Vaccines- Shingrix (2 of 2) 12/26/2022     Lab Results:    Latest Ref Rng & Units 10/17/2022    4:16 PM 06/21/2022   12:48 PM 08/15/2021   12:41 PM  BMP  Glucose 70 - 99 mg/dL 91  90  88   BUN 6 - 23 mg/dL 32  41  36   Creatinine 0.40 - 1.20 mg/dL 9.62  9.52  8.41   Sodium 135 - 145 mEq/L 138  138  139   Potassium 3.5 - 5.1 mEq/L 4.4  4.4  4.2   Chloride 96 - 112 mEq/L 103  106  105   CO2 19 - 32 mEq/L 31  26  31    Calcium 8.4 - 10.5 mg/dL 32.4  9.8  9.4     Assessment & Plan:   Problem List Items Addressed This Visit       Cardiovascular and Mediastinum   Essential hypertension - Primary   Blood pressure is in good control. Continue lisinopril 10 mg daily.        Nervous and Auditory   Sensorineural hearing loss (SNHL) of left ear with unrestricted hearing of right ear   Mild sensorineural loss on the left. Will monitor for now.       Return in about 3 months (around 05/13/2023) for Reassessment.   Loyola Mast, MD

## 2023-03-04 ENCOUNTER — Encounter: Payer: Medicare Other | Attending: Physical Medicine & Rehabilitation | Admitting: Physical Medicine & Rehabilitation

## 2023-03-04 VITALS — BP 118/75 | HR 69 | Ht 67.0 in | Wt 179.0 lb

## 2023-03-04 DIAGNOSIS — M48062 Spinal stenosis, lumbar region with neurogenic claudication: Secondary | ICD-10-CM | POA: Diagnosis not present

## 2023-03-04 NOTE — Patient Instructions (Signed)

## 2023-03-04 NOTE — Progress Notes (Unsigned)
 Subjective:    [name], female    DOB: 07-Sep-1945, 78 y.o.   MRN: [id number]  HPI 78 year old female with lumbar stenosis.  She has been quite active up until last summer.  She did jog/walk workouts.  She is no longer able to jog.  She did report improvements after the L5-S1 translaminar lumbar epidural steroid injection performed on 01/21/2023.  We discussed alternative exercises such as patient with bicycling which would allow cardio but put her spine in a better position to avoid neurogenic claudication.  RIght toe numbness much improved Dull aching painin Right buttocks  RIght L5-S1 Translaminar ESI  MRI LUMBAR SPINE WITHOUT CONTRAST   TECHNIQUE: Multiplanar, multisequence MR imaging of the lumbar spine was performed. No intravenous contrast was administered.   COMPARISON:  Lumbar x-ray 10/17/2022   FINDINGS: Segmentation:  Standard.   Alignment:  Physiologic.   Vertebrae: No acute fracture, evidence of discitis, or aggressive bone lesion.   Conus medullaris and cauda equina: Conus extends to the L1 level. Conus and cauda equina appear normal.   Paraspinal and other soft tissues: No acute paraspinal abnormality.   Disc levels:   Disc spaces: Moderate disc height loss at L2-3, L3-4, and L4-5. Disc desiccation at T11-12 and L5-S1.   T12-L1: Mild disc bulge with a small central disc protrusion and caudal migration of disc material. Mild bilateral facet arthropathy. Mild bilateral foraminal narrowing.   L1-L2: Mild disc bulge. Moderate bilateral facet arthropathy. Moderate left foraminal stenosis. Mild right foraminal stenosis. No central canal stenosis.   L2-L3: Mild disc bulge. Moderate bilateral facet arthropathy. Mild central canal stenosis. Mild bilateral foraminal stenosis.   L3-L4: Mild disc osteophyte complex. Mild bilateral facet arthropathy. Mild central canal stenosis and bilateral lateral recess stenosis. Mild bilateral foraminal  stenosis.   L4-L5: Mild disc osteophyte complex. Mild bilateral facet arthropathy. Mild central canal stenosis and bilateral lateral recess stenosis. Mild right and moderate left foraminal stenosis.   L5-S1: Mild disc bulge. Moderate bilateral facet arthropathy. Mild bilateral lateral recess stenosis. Moderate-severe bilateral foraminal stenosis.   IMPRESSION: 1. Diffuse lumbar spine spondylosis as described above. 2. No acute osseous injury of the lumbar spine.     Electronically Signed   By: Elige Ko M.D.   On: 12/02/2022 14:53 Pain Inventory Average Pain 8 Pain Right Now 3 My pain is sharp, burning, stabbing, tingling, and aching  In the last 24 hours, has pain interfered with the following? General activity 0 Relation with others 0 Enjoyment of life 0 What TIME of day is your pain at its worst? varies Sleep (in general) Fair  Pain is worse with: walking Pain improves with: heat/ice and medication Relief from Meds: 8  Family History  Problem Relation Age of Onset   Stroke Mother    Hypertension Mother    Heart disease Mother    Heart disease Father    Heart attack Father    Heart disease Sister    Cancer Sister        Lung   Kidney disease Sister    Breast cancer Sister    Hypertension Daughter    Cancer Brother        Lung   Cirrhosis Brother    Stroke Brother    Heart disease Brother    Heart disease Brother    Colon cancer Neg Hx    Colon polyps Neg Hx    Esophageal cancer Neg Hx    Rectal cancer Neg Hx  Stomach cancer Neg Hx    Social History   Socioeconomic History   Marital status: Widowed    Spouse name: Not on file   Number of children: 3   Years of education: Not on file   Highest education level: Some college, no degree  Occupational History   Occupation: Comptroller: [employer]  Tobacco Use   Smoking status: Never   Smokeless tobacco: Never  Vaping Use   Vaping status: Never Used  Substance and Sexual Activity    Alcohol use: No    Comment: glass of wine-special occasions   Drug use: No   Sexual activity: Never    Birth control/protection: Abstinence  Other Topics Concern   Not on file  Social History Narrative   Not on file   Social Drivers of Health   Financial Resource Strain: Low Risk  (02/03/2023)   Overall Financial Resource Strain (CARDIA)    Difficulty of Paying Living Expenses: Not hard at all  Food Insecurity: No Food Insecurity (02/03/2023)   Hunger Vital Sign    Worried About Running Out of Food in the Last Year: Never true    Ran Out of Food in the Last Year: Never true  Transportation Needs: No Transportation Needs (02/03/2023)   PRAPARE - Administrator, Civil Service (Medical): No    Lack of Transportation (Non-Medical): No  Physical Activity: Inactive (02/03/2023)   Exercise Vital Sign    Days of Exercise per Week: 0 days    Minutes of Exercise per Session: 0 min  Stress: No Stress Concern Present (02/03/2023)   [employer] of Occupational Health - Occupational Stress Questionnaire    Feeling of Stress : Only a little  Social Connections: Moderately Isolated (02/03/2023)   Social Connection and Isolation Panel [NHANES]    Frequency of Communication with Friends and Family: More than three times a week    Frequency of Social Gatherings with Friends and Family: Once a week    Attends Religious Services: More than 4 times per year    Active Member of [employer] or Organizations: No    Attends Banker Meetings: Never    Marital Status: Widowed   Past Surgical History:  Procedure Laterality Date   ANAL RECTAL MANOMETRY N/A 03/14/2021   Procedure: ANO RECTAL MANOMETRY;  Surgeon: Tressia Danas, MD;  Location: [facility];  Service: Gastroenterology;  Laterality: N/A;   CERVICAL SPINE SURGERY  06/15/2010   COLONOSCOPY  2005   normal   LEFT HEART CATH AND CORONARY ANGIOGRAPHY N/A 09/16/2017   Procedure: LEFT HEART CATH AND CORONARY ANGIOGRAPHY;   Surgeon: Orpah Cobb, MD;  Location: [facility];  Service: Cardiovascular;  Laterality: N/A;   Past Surgical History:  Procedure Laterality Date   ANAL RECTAL MANOMETRY N/A 03/14/2021   Procedure: ANO RECTAL MANOMETRY;  Surgeon: Tressia Danas, MD;  Location: [facility];  Service: Gastroenterology;  Laterality: N/A;   CERVICAL SPINE SURGERY  06/15/2010   COLONOSCOPY  2005   normal   LEFT HEART CATH AND CORONARY ANGIOGRAPHY N/A 09/16/2017   Procedure: LEFT HEART CATH AND CORONARY ANGIOGRAPHY;  Surgeon: Orpah Cobb, MD;  Location: [facility];  Service: Cardiovascular;  Laterality: N/A;   Past Medical History:  Diagnosis Date   Allergy    Anemia    Arthritis    right knee   Carotid bruit 10/11/2019   Carotid stenosis 10/11/2019   Cataract    forming both   Chronic  kidney disease    GERD (gastroesophageal reflux disease)    past hx   Goiter    Hyperlipidemia    Hypertension    Lower extremity edema 10/11/2019   Menopause age 47   Pure hypercholesterolemia 10/11/2019   Reflux    Shortness of breath 12/21/2020   Spinal stenosis    BP 118/75   Pulse 69   Ht 5\' 7"  (1.702 m)   Wt 179 lb (81.2 kg)   SpO2 98%   BMI 28.04 kg/m   Opioid Risk Score:   Fall Risk Score:  `1  Depression screen [facility] 2/9     02/03/2023    4:29 PM 12/10/2022   11:07 AM 11/12/2022    1:41 PM 11/08/2022    9:06 AM 11/05/2021    1:06 PM 10/18/2020    2:48 PM 10/26/2019   10:19 AM  Depression screen PHQ 2/9  Decreased Interest 0 0 0 0 0 0 0  Down, Depressed, Hopeless 0 0 0 0 0 0 0  PHQ - 2 Score 0 0 0 0 0 0 0  Altered sleeping    2     Tired, decreased energy    2     Change in appetite    0     Feeling bad or failure about yourself     0     Trouble concentrating    0     Moving slowly or fidgety/restless    1     Suicidal thoughts    0     PHQ-9 Score    5         Review of Systems  Musculoskeletal:  Positive for back pain.      Objective:   Physical  Exam Negative straight leg raising bilaterally Motor strength is 5/5 bilateral hip flexors knee extensors ankle dorsiflexors Sensation normal in lower extremities Mood and affect appropriate Ambulates without assistive device no evidence of toe drag or knee instability General no acute distress Lumbar spine no tenderness palpation along the lumbar paraspinals or in the gluteal region or in the greater trochanters.      Assessment & Plan:   Lumbar spinal stenosis improvement with L5-S1 translaminar injection.  We discussed that it is difficult to assess the duration of response expected.  We discussed that this injection may be repeated every 3 to 4 months Starting aquatic aerobic

## 2023-03-05 ENCOUNTER — Encounter: Payer: Self-pay | Admitting: Physical Medicine & Rehabilitation

## 2023-03-05 DIAGNOSIS — M48062 Spinal stenosis, lumbar region with neurogenic claudication: Secondary | ICD-10-CM | POA: Insufficient documentation

## 2023-04-07 ENCOUNTER — Inpatient Hospital Stay: Payer: Medicare Other | Attending: Internal Medicine

## 2023-04-07 DIAGNOSIS — D472 Monoclonal gammopathy: Secondary | ICD-10-CM | POA: Insufficient documentation

## 2023-04-07 LAB — CBC WITH DIFFERENTIAL (CANCER CENTER ONLY)
Abs Immature Granulocytes: 0.03 10*3/uL (ref 0.00–0.07)
Basophils Absolute: 0 10*3/uL (ref 0.0–0.1)
Basophils Relative: 0 %
Eosinophils Absolute: 0.2 10*3/uL (ref 0.0–0.5)
Eosinophils Relative: 3 %
HCT: 35 % — ABNORMAL LOW (ref 36.0–46.0)
Hemoglobin: 11 g/dL — ABNORMAL LOW (ref 12.0–15.0)
Immature Granulocytes: 1 %
Lymphocytes Relative: 31 %
Lymphs Abs: 2 10*3/uL (ref 0.7–4.0)
MCH: 29.1 pg (ref 26.0–34.0)
MCHC: 31.4 g/dL (ref 30.0–36.0)
MCV: 92.6 fL (ref 80.0–100.0)
Monocytes Absolute: 0.6 10*3/uL (ref 0.1–1.0)
Monocytes Relative: 9 %
Neutro Abs: 3.7 10*3/uL (ref 1.7–7.7)
Neutrophils Relative %: 56 %
Platelet Count: 194 10*3/uL (ref 150–400)
RBC: 3.78 MIL/uL — ABNORMAL LOW (ref 3.87–5.11)
RDW: 13 % (ref 11.5–15.5)
WBC Count: 6.5 10*3/uL (ref 4.0–10.5)
nRBC: 0 % (ref 0.0–0.2)

## 2023-04-07 LAB — CMP (CANCER CENTER ONLY)
ALT: 7 U/L (ref 0–44)
AST: 14 U/L — ABNORMAL LOW (ref 15–41)
Albumin: 3.9 g/dL (ref 3.5–5.0)
Alkaline Phosphatase: 52 U/L (ref 38–126)
Anion gap: 6 (ref 5–15)
BUN: 19 mg/dL (ref 8–23)
CO2: 27 mmol/L (ref 22–32)
Calcium: 9.6 mg/dL (ref 8.9–10.3)
Chloride: 108 mmol/L (ref 98–111)
Creatinine: 1.27 mg/dL — ABNORMAL HIGH (ref 0.44–1.00)
GFR, Estimated: 43 mL/min — ABNORMAL LOW (ref >60)
Glucose, Bld: 112 mg/dL — ABNORMAL HIGH (ref 70–99)
Potassium: 3.6 mmol/L (ref 3.5–5.1)
Sodium: 141 mmol/L (ref 135–145)
Total Bilirubin: 0.5 mg/dL (ref 0.0–1.2)
Total Protein: 7.1 g/dL (ref 6.5–8.1)

## 2023-04-07 LAB — LACTATE DEHYDROGENASE: LDH: 155 U/L (ref 98–192)

## 2023-04-08 LAB — KAPPA/LAMBDA LIGHT CHAINS
Kappa free light chain: 35.2 mg/L — ABNORMAL HIGH (ref 3.3–19.4)
Kappa, lambda light chain ratio: 1.5 (ref 0.26–1.65)
Lambda free light chains: 23.4 mg/L (ref 5.7–26.3)

## 2023-04-09 LAB — MULTIPLE MYELOMA PANEL, SERUM
Albumin SerPl Elph-Mcnc: 3.3 g/dL (ref 2.9–4.4)
Albumin/Glob SerPl: 1.1 (ref 0.7–1.7)
Alpha 1: 0.2 g/dL (ref 0.0–0.4)
Alpha2 Glob SerPl Elph-Mcnc: 0.7 g/dL (ref 0.4–1.0)
B-Globulin SerPl Elph-Mcnc: 0.9 g/dL (ref 0.7–1.3)
Gamma Glob SerPl Elph-Mcnc: 1.3 g/dL (ref 0.4–1.8)
Globulin, Total: 3.1 g/dL (ref 2.2–3.9)
IgA: 204 mg/dL (ref 64–422)
IgG (Immunoglobin G), Serum: 1502 mg/dL (ref 586–1602)
IgM (Immunoglobulin M), Srm: 70 mg/dL (ref 26–217)
M Protein SerPl Elph-Mcnc: 0.3 g/dL — ABNORMAL HIGH
Total Protein ELP: 6.4 g/dL (ref 6.0–8.5)

## 2023-04-20 NOTE — Progress Notes (Unsigned)
 Cardiology Office Note:  .   Date:  04/21/2023  ID:  Jo [name], DOB Dec 23, 1945, MRN [id number] PCP: Loyola Mast, MD  [facility] Providers Cardiologist:  None    Patient Profile: .      PMH Hypertension Hyperlipidemia Pre-diabetes Mild calcification of the aortic valve Cardiac catheterization 09/2017 Normal coronary arteries  Initially seen 09/2019 to establish care in the hypertension clinic.  She previously saw Dr. Algie Coffer 09/2017.  At that time she was having chest pain.  Echo revealed LVEF 65 to 70% with mild LVH and grade 1 diastolic dysfunction.  Mild calcification of the aortic valve was noted but no aortic stenosis.  She also had significant mitral annular calcification.  Left heart catheterization showed no coronary artery disease.  She was diagnosed with hypertension about 20 years prior and was switched from lisinopril to hydrochlorothiazide.  At that time her PCP also started her on rosuvastatin.  Carotid Dopplers 10/2019 revealed mild ICA stenosis bilaterally.  At follow-up 05/2020, she had myalgias and rosuvastatin was held.  She had shortness of breath, BNP was 52.  Repeat carotid Doppler showed mild blockage on the right and moderate on the left.  She had seen oncology for workup for multiple myeloma which was negative.  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  She is seen by nephrology.  Last cardiology clinic visit was 04/18/2021 with Dr. Duke Salvia.  She was walking daily for about an hour.  No chest pain or dyspnea when walking.  On Repatha and doing well.  She was told to stop taking hydrochlorothiazide by nephrology.  BP has been fluctuating at office visits and was mildly elevated at 152/84 in clinic.  She reported generally well-controlled at home.  Previous antihypertensives Lisinopril HCTZ       History of Present Illness: .   Jo [name] is a very pleasant 78 y.o. female who is here today for  follow-up of hypertension. Has decreased activity level due to right hip pain. She recently got an injection in her hip and pain is getting better. Pain has interfered with her walking for exercise which she is trying to resume.  She does notice some shortness of breath when walking up an incline but no problems when walking on level ground. Home BP typically 117-125/72 mmHg. She sees hematology for anemia; was previously on iron injections, but is no longer taking these.  She has been told that she snores but no history of apnea to her awareness.  Discussed the use of AI scribe software for clinical note transcription with the patient, who gave verbal consent to proceed.   ROS: See HPI       Studies Reviewed: Marland Kitchen   EKG Interpretation Date/Time:  Monday April 21 2023 14:16:57 EDT Ventricular Rate:  66 PR Interval:  172 QRS Duration:  76 QT Interval:  382 QTC Calculation: 400 R Axis:   -18  Text Interpretation: Normal sinus rhythm Moderate voltage criteria for LVH, may be normal variant ( R in aVL , Cornell product ) When compared with ECG of 15-Sep-2017 06:42, Nonspecific T wave abnormality no longer evident in Inferior leads Confirmed by Eligha Bridegroom [phone]) on 04/21/2023 2:27:59 PM     No results found for: "LIPOA"   Risk Assessment/Calculations:         STOP-Bang Score:  4      Physical Exam:   VS:  BP 122/70   Pulse 71   Ht  5\' 7"  (1.702 m)   Wt 179 lb (81.2 kg)   SpO2 98%   BMI 28.04 kg/m    Wt Readings from Last 3 Encounters:  04/21/23 179 lb (81.2 kg)  03/04/23 179 lb (81.2 kg)  02/12/23 178 lb (80.7 kg)    GEN: Well nourished, well developed in no acute distress NECK: No JVD; No carotid bruits CARDIAC: RRR, no murmurs, rubs, gallops RESPIRATORY:  Clear to auscultation without rales, wheezing or rhonchi  ABDOMEN: Soft, non-tender, non-distended EXTREMITIES:  No edema; No deformity     ASSESSMENT AND PLAN: .    Hypertension: BP is well-controlled.  Renal function  has been stable per her report. Has soon appointment with nephrology. No medication changes today.   Carotid stenosis: Carotid duplex 02/17/2022 with mild bilateral stenosis. No concerning symptoms, no history of TIA. We will continue to monitor clinically for now as stenosis has been mild and stable.   Hyperlipidemia LDL goal < 70: Lipid panel completed 10/17/2022 with total cholesterol 204, triglycerides 151, HDL 61, LDL 113. She has been on Repatha for a couple of years and has not had any concerning side effects. She did not recall having results of lipid panel reviewed with her.  We will repeat this week when she can return fasting. Continue Repatha.  Hip pain: Right hip pain which is interfering with regular activities. She reports pain is improving with recent injection.  Management per Phys Med.   Snoring: Stop bang score of 4. Her daughter has reported she snores but no report of apnea.  We discussed home sleep test which she will consider.       Disposition:1 year with Dr. Duke Salvia or APP  Signed, Eligha Bridegroom, NP-C

## 2023-04-21 ENCOUNTER — Ambulatory Visit (HOSPITAL_BASED_OUTPATIENT_CLINIC_OR_DEPARTMENT_OTHER): Payer: Medicare Other | Admitting: Nurse Practitioner

## 2023-04-21 ENCOUNTER — Encounter (HOSPITAL_BASED_OUTPATIENT_CLINIC_OR_DEPARTMENT_OTHER): Payer: Self-pay | Admitting: Nurse Practitioner

## 2023-04-21 VITALS — BP 122/70 | HR 71 | Ht 67.0 in | Wt 179.0 lb

## 2023-04-21 DIAGNOSIS — M25551 Pain in right hip: Secondary | ICD-10-CM

## 2023-04-21 DIAGNOSIS — R0683 Snoring: Secondary | ICD-10-CM

## 2023-04-21 DIAGNOSIS — E785 Hyperlipidemia, unspecified: Secondary | ICD-10-CM | POA: Diagnosis not present

## 2023-04-21 DIAGNOSIS — I10 Essential (primary) hypertension: Secondary | ICD-10-CM

## 2023-04-21 DIAGNOSIS — I6523 Occlusion and stenosis of bilateral carotid arteries: Secondary | ICD-10-CM | POA: Diagnosis not present

## 2023-04-21 NOTE — Patient Instructions (Signed)
 Medication Instructions:  Your physician recommends that you continue on your current medications as directed. Please refer to the Current Medication list given to you today.   *If you need a refill on your cardiac medications before your next appointment, please call your pharmacy*  Lab Work: FASTING LIPID SOON   If you have labs (blood work) drawn today and your tests are completely normal, you will receive your results only by: MyChart Message (if you have MyChart) OR A paper copy in the mail If you have any lab test that is abnormal or we need to change your treatment, we will call you to review the results.  Testing/Procedures: NONE  Follow-Up: At [facility], you and your health needs are our priority.  As part of our continuing mission to provide you with exceptional heart care, our providers are all part of one team.  This team includes your primary Cardiologist (physician) and Advanced Practice Providers or APPs (Physician Assistants and Nurse Practitioners) who all work together to provide you with the care you need, when you need it.  Your next appointment:   12 month(s)  Provider:   Chilton Si, MD, Eligha Bridegroom, NP, or Gillian Shields, NP    We recommend signing up for the patient portal called "MyChart".  Sign up information is provided on this After Visit Summary.  MyChart is used to connect with patients for Virtual Visits (Telemedicine).  Patients are able to view lab/test results, encounter notes, upcoming appointments, etc.  Non-urgent messages can be sent to your provider as well.   To learn more about what you can do with MyChart, go to [email or web address].

## 2023-04-24 ENCOUNTER — Inpatient Hospital Stay: Payer: Medicare Other | Attending: Internal Medicine

## 2023-04-24 VITALS — BP 134/72 | HR 68 | Temp 97.3°F | Resp 18 | Wt 178.2 lb

## 2023-04-24 DIAGNOSIS — N1832 Chronic kidney disease, stage 3b: Secondary | ICD-10-CM | POA: Insufficient documentation

## 2023-04-24 DIAGNOSIS — D649 Anemia, unspecified: Secondary | ICD-10-CM

## 2023-04-24 DIAGNOSIS — D631 Anemia in chronic kidney disease: Secondary | ICD-10-CM | POA: Insufficient documentation

## 2023-04-24 DIAGNOSIS — Z79899 Other long term (current) drug therapy: Secondary | ICD-10-CM | POA: Insufficient documentation

## 2023-04-24 DIAGNOSIS — D472 Monoclonal gammopathy: Secondary | ICD-10-CM | POA: Diagnosis not present

## 2023-04-24 DIAGNOSIS — I129 Hypertensive chronic kidney disease with stage 1 through stage 4 chronic kidney disease, or unspecified chronic kidney disease: Secondary | ICD-10-CM | POA: Insufficient documentation

## 2023-04-24 NOTE — Assessment & Plan Note (Signed)
 Stable

## 2023-04-24 NOTE — Assessment & Plan Note (Signed)
 Stable, with anemia of chronic disease Adequate level of B12, folate, ferritin Normal haptoglobin, bilirubin, & negative DAT

## 2023-04-24 NOTE — Progress Notes (Signed)
 [facility] CONSULT NOTE  Patient Care Team: Loyola Mast, MD as PCP - General (Family Medicine) [name], Casimiro Needle, MD (Endocrinology) Charolett Bumpers, MD (Gastroenterology) Colon Branch, MD (Inactive) (Neurosurgery) Chilton Si, MD as Attending Physician (Cardiology) Arita Miss, MD as Attending Physician (Nephrology) Wynn Banker Victorino Sparrow, MD as Consulting Physician (Physical Medicine and Rehabilitation) Loni Muse, MD (Inactive) as Consulting Physician (Oncology)  ASSESSMENT & PLAN:  78 y.o.female with history of degenerative disc disease, osteoarthritis, GERD, CKD, HTN, HLD, arthritis referred to [facility] for MGUS.  MGUS diagnosed in 2022.  She was found to have IgG kappa with M spike of 0.2 g/dL   Discussed with patient her MGUS is stable. M protein 0.3 g/dL Previous labs show borderline elevated renal function appears to have CKD.  Creatinine improved last month.  Borderline anemia stable.  She has right joint pain and says she will follow-up with her primary care in the coming weeks for evaluation with history of arthritis.  Continue to monitor lab for kidney function, anemia, calcium level and M protein.  Patient will return in late July for lab and follow up in Aug.  MGUS (monoclonal gammopathy of unknown significance) Stable Repeat in about 4 months with CBC, CMP, LDH, immunoglobulin panel, SPEP and free light chain 2 weeks before visit  Normocytic anemia Stable, with anemia of chronic disease Adequate level of B12, folate, ferritin Normal haptoglobin, bilirubin, & negative DAT  Stage 3b chronic kidney disease (HCC) Stable   Orders Placed This Encounter  Procedures   CBC with Differential (Cancer Center Only)    Standing Status:   Future    Expiration Date:   04/23/2024   CMP (Cancer Center only)    Standing Status:   Future    Expiration Date:   04/23/2024   Kappa/lambda light chains     Standing Status:   Future    Expiration Date:   04/23/2024   IgG, IgA, IgM    Standing Status:   Future    Expiration Date:   04/23/2024   Serum protein electrophoresis with reflex    Standing Status:   Future    Expiration Date:   04/23/2024    All questions were answered. The patient knows to call the clinic with any problems, questions or concerns.     Melven Sartorius, MD 04/24/23 1:27 PM  CHIEF COMPLAINTS/PURPOSE OF CONSULTATION:  MGUS  HISTORY OF PRESENTING ILLNESS:  Jo [name] 78 y.o. female is here because of MGUS. Overall she is feeling well.  Report of right hip pain recently and had injection that helped.  She also developed left shoulder pain afterwards.  She has history of arthritis.  Reports she will be seeing her PCP in a few weeks for evaluation.  She is drinking more fluid.  No difficulty with urination.   Hematology history: MGUS diagnosed in 2022.  She was found to have IgG kappa with M spike of 0.2 g/dL without evidence of endorgan damage to suggest multiple myeloma.   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   June 21 2022:  Scheduled follow up for MGUS. M-protein 0.4 g/dL. Immunofixation shows IgG monoclonal protein with kappa light chain specificity. Kappa 42. Lambda 29. K/L 1.49  12/13/22 K 27. M-protein 0.3 g/dL. Immunofixation shows IgG monoclonal protein with kappa light chain specificity. L 19. K/L 1.36. hgb 11.4.  Normal IgG, IgM, IgA  She has chronic right hip pain and  waiting to start injection. Report she was told having pinched nerve. She using heat and ice and able to manage most day. No new pain.   11/10/22 MRI L spine:  IMPRESSION: 1. Diffuse lumbar spine spondylosis as described above. 2. No acute osseous injury of the lumbar spine.  Denies loss of appetite, night sweats, difficulty urinating or abnormal weight loss.  MEDICAL HISTORY:  Past Medical History:  Diagnosis Date   Allergy    Anemia     Arthritis    right knee   Carotid bruit 10/11/2019   Carotid stenosis 10/11/2019   Cataract    forming both   Chronic kidney disease    GERD (gastroesophageal reflux disease)    past hx   Goiter    Hyperlipidemia    Hypertension    Lower extremity edema 10/11/2019   Menopause age 10   Pure hypercholesterolemia 10/11/2019   Reflux    Shortness of breath 12/21/2020   Spinal stenosis     ALLERGIES:  is allergic to demerol, crestor [rosuvastatin], and lipitor [atorvastatin].  MEDICATIONS:  Current Outpatient Medications  Medication Sig Dispense Refill   acetaminophen (TYLENOL) 325 MG tablet Take 325 mg by mouth every 6 (six) hours as needed for moderate pain.     aspirin EC 81 MG tablet Take 81 mg by mouth at bedtime.     Cholecalciferol (DIALYVITE VITAMIN D 5000) 125 MCG (5000 UT) capsule Take 5,000 Units by mouth at bedtime.     Evolocumab (REPATHA SURECLICK) 140 MG/ML SOAJ INJECT 1 ML SUBCUTANEOUSLY  EVERY TWO WEEKS 6 mL 3   FARXIGA 10 MG TABS tablet Take 10 mg by mouth daily.     Ferrous Sulfate (IRON SLOW RELEASE PO) Take 1 tablet by mouth daily.     lisinopril (ZESTRIL) 10 MG tablet Take 10 mg by mouth daily.     polyethylene glycol (MIRALAX / GLYCOLAX) packet Take 17 g by mouth daily as needed (constipation). Mix in 8 oz liquid and drink     triamcinolone cream (KENALOG) 0.1 % Apply 1 Application topically 2 (two) times daily. 30 g 0   vitamin B-12 (CYANOCOBALAMIN) 1000 MCG tablet Take 1,000 mcg by mouth at bedtime.      No current facility-administered medications for this visit.    REVIEW OF SYSTEMS:   All relevant systems were reviewed with the patient and are negative.  PHYSICAL EXAMINATION: ECOG PERFORMANCE STATUS: 2 due to chronic arthritis and DDD  Vitals:   04/24/23 1246  BP: 134/72  Pulse: 68  Resp: 18  Temp: (!) 97.3 F (36.3 C)  SpO2: 100%   Filed Weights   04/24/23 1246  Weight: 178 lb 3 oz (80.8 kg)    GENERAL: alert, no distress and  comfortable Neuro: Strength and sensation equal bilaterally  LABORATORY DATA:  I have reviewed the data as listed Lab Results  Component Value Date   WBC 6.5 04/07/2023   HGB 11.0 (L) 04/07/2023   HCT 35.0 (L) 04/07/2023   MCV 92.6 04/07/2023   PLT 194 04/07/2023    RADIOGRAPHIC STUDIES: I have personally reviewed her previous imaging.

## 2023-04-24 NOTE — Assessment & Plan Note (Signed)
 Stable Repeat in about 4 months with CBC, CMP, LDH, immunoglobulin panel, SPEP and free light chain 2 weeks before visit

## 2023-06-17 ENCOUNTER — Other Ambulatory Visit: Payer: Self-pay | Admitting: Cardiovascular Disease

## 2023-06-17 DIAGNOSIS — E7800 Pure hypercholesterolemia, unspecified: Secondary | ICD-10-CM

## 2023-06-17 DIAGNOSIS — E785 Hyperlipidemia, unspecified: Secondary | ICD-10-CM

## 2023-06-17 DIAGNOSIS — I6521 Occlusion and stenosis of right carotid artery: Secondary | ICD-10-CM

## 2023-06-17 DIAGNOSIS — I6523 Occlusion and stenosis of bilateral carotid arteries: Secondary | ICD-10-CM

## 2023-08-11 ENCOUNTER — Inpatient Hospital Stay: Attending: Internal Medicine

## 2023-08-11 DIAGNOSIS — D472 Monoclonal gammopathy: Secondary | ICD-10-CM | POA: Insufficient documentation

## 2023-08-11 LAB — CBC WITH DIFFERENTIAL (CANCER CENTER ONLY)
Abs Immature Granulocytes: 0.03 K/uL (ref 0.00–0.07)
Basophils Absolute: 0 K/uL (ref 0.0–0.1)
Basophils Relative: 0 %
Eosinophils Absolute: 0.2 K/uL (ref 0.0–0.5)
Eosinophils Relative: 2 %
HCT: 34.1 % — ABNORMAL LOW (ref 36.0–46.0)
Hemoglobin: 10.9 g/dL — ABNORMAL LOW (ref 12.0–15.0)
Immature Granulocytes: 0 %
Lymphocytes Relative: 27 %
Lymphs Abs: 2.4 K/uL (ref 0.7–4.0)
MCH: 29.1 pg (ref 26.0–34.0)
MCHC: 32 g/dL (ref 30.0–36.0)
MCV: 90.9 fL (ref 80.0–100.0)
Monocytes Absolute: 1 K/uL (ref 0.1–1.0)
Monocytes Relative: 11 %
Neutro Abs: 5.4 K/uL (ref 1.7–7.7)
Neutrophils Relative %: 60 %
Platelet Count: 209 K/uL (ref 150–400)
RBC: 3.75 MIL/uL — ABNORMAL LOW (ref 3.87–5.11)
RDW: 13.6 % (ref 11.5–15.5)
WBC Count: 9.1 K/uL (ref 4.0–10.5)
nRBC: 0 % (ref 0.0–0.2)

## 2023-08-11 LAB — CMP (CANCER CENTER ONLY)
ALT: 8 U/L (ref 0–44)
AST: 14 U/L — ABNORMAL LOW (ref 15–41)
Albumin: 3.7 g/dL (ref 3.5–5.0)
Alkaline Phosphatase: 68 U/L (ref 38–126)
Anion gap: 3 — ABNORMAL LOW (ref 5–15)
BUN: 29 mg/dL — ABNORMAL HIGH (ref 8–23)
CO2: 30 mmol/L (ref 22–32)
Calcium: 9.5 mg/dL (ref 8.9–10.3)
Chloride: 107 mmol/L (ref 98–111)
Creatinine: 1.44 mg/dL — ABNORMAL HIGH (ref 0.44–1.00)
GFR, Estimated: 37 mL/min — ABNORMAL LOW (ref >60)
Glucose, Bld: 88 mg/dL (ref 70–99)
Potassium: 4.1 mmol/L (ref 3.5–5.1)
Sodium: 140 mmol/L (ref 135–145)
Total Bilirubin: 0.3 mg/dL (ref 0.0–1.2)
Total Protein: 7.2 g/dL (ref 6.5–8.1)

## 2023-08-12 LAB — KAPPA/LAMBDA LIGHT CHAINS
Kappa free light chain: 35.5 mg/L — ABNORMAL HIGH (ref 3.3–19.4)
Kappa, lambda light chain ratio: 1.39 (ref 0.26–1.65)
Lambda free light chains: 25.6 mg/L (ref 5.7–26.3)

## 2023-08-12 LAB — IGG, IGA, IGM
IgA: 196 mg/dL (ref 64–422)
IgG (Immunoglobin G), Serum: 1446 mg/dL (ref 586–1602)
IgM (Immunoglobulin M), Srm: 70 mg/dL (ref 26–217)

## 2023-08-14 LAB — PROTEIN ELECTROPHORESIS, SERUM, WITH REFLEX
A/G Ratio: 1.2 (ref 0.7–1.7)
Albumin ELP: 3.6 g/dL (ref 2.9–4.4)
Alpha-1-Globulin: 0.2 g/dL (ref 0.0–0.4)
Alpha-2-Globulin: 0.7 g/dL (ref 0.4–1.0)
Beta Globulin: 0.9 g/dL (ref 0.7–1.3)
Gamma Globulin: 1.3 g/dL (ref 0.4–1.8)
Globulin, Total: 3 g/dL (ref 2.2–3.9)
M-Spike, %: 0.3 g/dL — ABNORMAL HIGH
SPEP Interpretation: 0
Total Protein ELP: 6.6 g/dL (ref 6.0–8.5)

## 2023-08-14 LAB — IMMUNOFIXATION REFLEX, SERUM
IgA: 199 mg/dL (ref 64–422)
IgG (Immunoglobin G), Serum: 1493 mg/dL (ref 586–1602)
IgM (Immunoglobulin M), Srm: 67 mg/dL (ref 26–217)

## 2023-08-21 NOTE — Progress Notes (Signed)
  [facility] OFFICE PROGRESS NOTE  Patient Care Team: [name], MD as PCP - General (Family Medicine) [name], [name], MD (Endocrinology) [name], MD (Gastroenterology) [name], MD (Inactive) (Neurosurgery) [name], MD as Attending Physician (Cardiology) [name], MD as Attending Physician (Nephrology) [name], MD as Consulting Physician (Physical Medicine and Rehabilitation) [name], MD (Inactive) as Consulting Physician (Oncology)  78 y.o.female with history of degenerative disc disease, osteoarthritis, GERD, CKD, HTN, HLD, arthritis referred to [facility] for MGUS.   MGUS diagnosed in 2022.  She was found to have IgG kappa with M spike of 0.2 g/dL    Discussed with patient her MGUS is stable. M protein 0.3 g/dL Previous labs show borderline elevated renal function appears to have CKD.  Creatinine improved last month.   Borderline anemia stable. M spike of 0.3.  Assessment & Plan MGUS (monoclonal gammopathy of unknown significance) Stable Repeat in about 4 months with CBC, CMP, LDH, immunoglobulin panel, SPEP and free light chain 2 weeks before visit Stage 3b chronic kidney disease (HCC) Stable Continue monitor with MGUS labs. Normocytic anemia Stable, with anemia of chronic disease Adequate level of B12, folate, ferritin Normal haptoglobin, bilirubin, & negative DAT  Orders Placed This Encounter  Procedures   CBC with Differential (Cancer Center Only)    Standing Status:   Future    Expiration Date:   08/24/2024   CMP (Cancer Center only)    Standing Status:   Future    Expiration Date:   08/24/2024   Serum protein electrophoresis with reflex    Standing Status:   Future    Expiration Date:   08/24/2024   Kappa/lambda light chains    Standing Status:   Future    Expiration Date:   08/24/2024   IgG, IgA, IgM    Standing Status:   Future    Expiration Date:    08/24/2024     Pauletta BROCKS Chihuahua, MD  INTERVAL HISTORY: Patient returns for follow-up.  No change in her health. Appetite is good. Osteoarthritis does not require surgery. No new bone or back pain. Shoulder pain on the right sometimes hard lifting for about last 4 months. Trying to drink more water. No bloody stool, bleeding. Iron makes it dark.   PHYSICAL EXAMINATION: ECOG PERFORMANCE STATUS: {CHL ONC ECOG ED:[id number]}  Vitals:   08/25/23 1512  BP: 135/69  Pulse: 72  Resp: 18  Temp: 97.6 F (36.4 C)  SpO2: 99%   Filed Weights   08/25/23 1512  Weight: 180 lb 4.8 oz (81.8 kg)    Relevant data reviewed during this visit included ***

## 2023-08-24 NOTE — Assessment & Plan Note (Addendum)
 Stable Repeat in about 4 months with CBC, CMP, LDH, immunoglobulin panel, SPEP and free light chain 2 weeks before visit

## 2023-08-24 NOTE — Assessment & Plan Note (Addendum)
 Stable, with anemia of chronic disease Adequate level of B12, folate, ferritin Normal haptoglobin, bilirubin, & negative DAT

## 2023-08-24 NOTE — Assessment & Plan Note (Addendum)
 Stable Continue monitor with MGUS labs.

## 2023-08-25 ENCOUNTER — Inpatient Hospital Stay: Attending: Internal Medicine

## 2023-08-25 VITALS — BP 135/69 | HR 72 | Temp 97.6°F | Resp 18 | Wt 180.3 lb

## 2023-08-25 DIAGNOSIS — D649 Anemia, unspecified: Secondary | ICD-10-CM | POA: Diagnosis not present

## 2023-08-25 DIAGNOSIS — M25511 Pain in right shoulder: Secondary | ICD-10-CM | POA: Insufficient documentation

## 2023-08-25 DIAGNOSIS — D472 Monoclonal gammopathy: Secondary | ICD-10-CM | POA: Diagnosis present

## 2023-08-25 DIAGNOSIS — N1832 Chronic kidney disease, stage 3b: Secondary | ICD-10-CM | POA: Diagnosis not present

## 2023-08-25 DIAGNOSIS — D638 Anemia in other chronic diseases classified elsewhere: Secondary | ICD-10-CM | POA: Insufficient documentation

## 2023-08-25 DIAGNOSIS — Z79899 Other long term (current) drug therapy: Secondary | ICD-10-CM | POA: Insufficient documentation

## 2023-11-18 ENCOUNTER — Inpatient Hospital Stay: Attending: Internal Medicine

## 2023-11-18 DIAGNOSIS — D472 Monoclonal gammopathy: Secondary | ICD-10-CM

## 2023-11-18 LAB — CMP (CANCER CENTER ONLY)
ALT: 7 U/L (ref 0–44)
AST: 14 U/L — ABNORMAL LOW (ref 15–41)
Albumin: 4.2 g/dL (ref 3.5–5.0)
Alkaline Phosphatase: 71 U/L (ref 38–126)
Anion gap: 6 (ref 5–15)
BUN: 24 mg/dL — ABNORMAL HIGH (ref 8–23)
CO2: 30 mmol/L (ref 22–32)
Calcium: 10.2 mg/dL (ref 8.9–10.3)
Chloride: 104 mmol/L (ref 98–111)
Creatinine: 1.33 mg/dL — ABNORMAL HIGH (ref 0.44–1.00)
GFR, Estimated: 41 mL/min — ABNORMAL LOW (ref >60)
Glucose, Bld: 82 mg/dL (ref 70–99)
Potassium: 4.3 mmol/L (ref 3.5–5.1)
Sodium: 140 mmol/L (ref 135–145)
Total Bilirubin: 0.4 mg/dL (ref 0.0–1.2)
Total Protein: 7.9 g/dL (ref 6.5–8.1)

## 2023-11-18 LAB — CBC WITH DIFFERENTIAL (CANCER CENTER ONLY)
Abs Immature Granulocytes: 0.03 K/uL (ref 0.00–0.07)
Basophils Absolute: 0 K/uL (ref 0.0–0.1)
Basophils Relative: 0 %
Eosinophils Absolute: 0.2 K/uL (ref 0.0–0.5)
Eosinophils Relative: 2 %
HCT: 37.8 % (ref 36.0–46.0)
Hemoglobin: 12.1 g/dL (ref 12.0–15.0)
Immature Granulocytes: 0 %
Lymphocytes Relative: 32 %
Lymphs Abs: 2.7 K/uL (ref 0.7–4.0)
MCH: 28.8 pg (ref 26.0–34.0)
MCHC: 32 g/dL (ref 30.0–36.0)
MCV: 90 fL (ref 80.0–100.0)
Monocytes Absolute: 0.8 K/uL (ref 0.1–1.0)
Monocytes Relative: 10 %
Neutro Abs: 4.4 K/uL (ref 1.7–7.7)
Neutrophils Relative %: 56 %
Platelet Count: 228 K/uL (ref 150–400)
RBC: 4.2 MIL/uL (ref 3.87–5.11)
RDW: 13.3 % (ref 11.5–15.5)
WBC Count: 8.2 K/uL (ref 4.0–10.5)
nRBC: 0 % (ref 0.0–0.2)

## 2023-11-19 LAB — KAPPA/LAMBDA LIGHT CHAINS
Kappa free light chain: 36 mg/L — ABNORMAL HIGH (ref 3.3–19.4)
Kappa, lambda light chain ratio: 1.31 (ref 0.26–1.65)
Lambda free light chains: 27.5 mg/L — ABNORMAL HIGH (ref 5.7–26.3)

## 2023-11-20 ENCOUNTER — Ambulatory Visit: Payer: Self-pay

## 2023-11-20 LAB — IGG, IGA, IGM
IgA: 238 mg/dL (ref 64–422)
IgG (Immunoglobin G), Serum: 1661 mg/dL — ABNORMAL HIGH (ref 586–1602)
IgM (Immunoglobulin M), Srm: 76 mg/dL (ref 26–217)

## 2023-11-20 NOTE — Telephone Encounter (Signed)
 FYI Only or Action Required?: FYI only for provider: appointment scheduled on 11/21/23.  Patient was last seen in primary care on 02/12/2023 by [name], MD.  Called Nurse Triage reporting Shoulder Pain.  Symptoms began several weeks ago.  Interventions attempted: OTC medications: Tylenol , Rest, hydration, or home remedies, and Ice/heat application.  Symptoms are: gradually worsening.  Triage Disposition: See PCP When Office is Open (Within 3 Days)  Patient/caregiver understands and will follow disposition?: Yes Reason for Disposition  [1] MODERATE pain (e.g., interferes with normal activities) AND [2] present > 3 days  Answer Assessment - Initial Assessment Questions Denies numbness in left arm. Patient has used cold compress, heat, light exercise and Tylenol   1. ONSET: When did the pain start?     3 weeks ago  2. LOCATION: Where is the pain located?     Left shoulder  3. PAIN: How bad is the pain? (Scale 1-10; or mild, moderate, severe)     Started out light, felt overworked, now its continuous since last Friday, can barely lift arm up  4. WORK OR EXERCISE: Has there been any recent work or exercise that involved this part of the body?     Denies  5. CAUSE: What do you think is causing the shoulder pain?     Unsure  6. OTHER SYMPTOMS: Do you have any other symptoms? (e.g., neck pain, swelling, rash, fever, numbness, weakness)     Denies  Protocols used: Shoulder Pain-A-AH  Copied from CRM #[id number]. Topic: Clinical - Red Word Triage >> Nov 20, 2023 10:26 AM Jo [name] wrote: Red Word that prompted transfer to Nurse Triage: Patient is experiencing severe left shoulder pain, it started about 3 weeks ago as a slight pain but this week the pain has gotten worse to the point where she is unable to lift her arm.

## 2023-11-20 NOTE — Telephone Encounter (Signed)
 Noted. OV scheduled with Dr. Sebastian on 11/21/2023

## 2023-11-21 ENCOUNTER — Ambulatory Visit (INDEPENDENT_AMBULATORY_CARE_PROVIDER_SITE_OTHER): Admitting: Family Medicine

## 2023-11-21 ENCOUNTER — Encounter: Payer: Self-pay | Admitting: Family Medicine

## 2023-11-21 ENCOUNTER — Ambulatory Visit

## 2023-11-21 VITALS — BP 118/60 | HR 74 | Temp 97.0°F | Resp 18 | Wt 179.8 lb

## 2023-11-21 DIAGNOSIS — M25512 Pain in left shoulder: Secondary | ICD-10-CM

## 2023-11-21 DIAGNOSIS — N1832 Chronic kidney disease, stage 3b: Secondary | ICD-10-CM

## 2023-11-21 DIAGNOSIS — R7303 Prediabetes: Secondary | ICD-10-CM

## 2023-11-21 DIAGNOSIS — I10 Essential (primary) hypertension: Secondary | ICD-10-CM

## 2023-11-21 MED ORDER — DICLOFENAC SODIUM 1 % EX GEL: 4.0000 g | Freq: Four times a day (QID) | CUTANEOUS | 3 refills | Status: AC | PRN

## 2023-11-21 MED ORDER — PREDNISONE 10 MG PO TABS: ORAL_TABLET | ORAL | 0 refills | Status: AC

## 2023-11-21 NOTE — Progress Notes (Signed)
 Assessment & Plan   Assessment/Plan:   Assessment and Plan Assessment & Plan Left shoulder pain One week, worsening yesterday with severe pain causing tremors and chills. No history of injury or heavy lifting. Pain exacerbated by certain movements, particularly elevating the arm above the head. Differential diagnosis includes arthritis, rotator cuff issue, tendinopathy, or glenohumeral wear and tear. Tylenol  provides some relief but does not address inflammation. - Ordered left shoulder x-ray to assess for arthritis or other structural issues. - Prescribed prednisone  course pack to reduce inflammation and provide relief. - Prescribed topical 1% diclofenac gel QID PRN additional pain relief. - Advised taking steroids with food and to consider Pepcid  if indigestion occurs. - Instructed to continue using Tylenol  as needed for pain.   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 Current medications and treatments for shoulder pain do not adversely affect kidney function. - Advise avoidance of NSAIDs to prevent renal injury  Prediabetes  Hypertension (well controlled today)  Consideration of steroid use due to potential impact on blood pressure and blood sugar levels. - Monitor blood sugar levels closely during steroid treatment. - Monitor blood pressure closely         There are no discontinued medications.  Return if symptoms worsen or fail to improve.        Subjective:   Encounter date: 11/21/2023  Jo [name] is a 78 y.o. female who has Essential hypertension; Pre-diabetes; Hyperlipidemia; Carotid stenosis; Unilateral primary osteoarthritis, right knee; MGUS (monoclonal gammopathy of unknown significance); Normocytic anemia; Constipation due to outlet dysfunction; Stage 3b chronic kidney disease (HCC); Right lumbar radiculitis; Degeneration of intervertebral disc of lumbar region with discogenic back pain and lower extremity pain; Statin intolerance; Sensorineural  hearing loss (SNHL) of left ear with unrestricted hearing of right ear; and Spinal stenosis of lumbar region with neurogenic claudication on their problem list..   She  has a past medical history of Allergy, Anemia, Arthritis, Carotid bruit (10/11/2019), Carotid stenosis (10/11/2019), Cataract, Chronic kidney disease, GERD (gastroesophageal reflux disease), Goiter, Hyperlipidemia, Hypertension, Lower extremity edema (10/11/2019), Menopause (age 6), Pure hypercholesterolemia (10/11/2019), Reflux, Shortness of breath (12/21/2020), and Spinal stenosis..   Discussed the use of AI scribe software for clinical note transcription with the patient, who gave verbal consent to proceed.  History of Present Illness Jo [name] is a 78 year old female with chronic kidney disease stage 3B who presents with shoulder pain.  Shoulder pain - Severe pain for the past week, with associated trembling and chills yesterday - Slight improvement in pain today - Pain worsened by raising arm above head or moving it outward - No recent injuries, falls, or heavy lifting - Tylenol  and heat compresses used for pain relief - Possible excessive Tylenol  intake in attempt to manage pain  Chronic kidney disease management - Chronic kidney disease stage 3B - Farxiga taken for kidney-related issues  Sleep position - Sleeps flat on her back, although prefers to sleep on her side   Review of Systems  Constitutional:  Negative for chills, diaphoresis, fever, malaise/fatigue and weight loss.  HENT:  Negative for congestion, ear discharge, ear pain and hearing loss.   Eyes:  Negative for blurred vision, double vision, photophobia, pain, discharge and redness.  Respiratory:  Negative for cough, sputum production, shortness of breath and wheezing.   Cardiovascular:  Negative for chest pain, palpitations and leg swelling.  Gastrointestinal:  Negative for abdominal pain, blood in stool, constipation, diarrhea, heartburn,  melena, nausea and vomiting.  Genitourinary:  Negative for dysuria, flank pain, frequency, hematuria and urgency.  Musculoskeletal:  Positive for joint pain (Left shoulder). Negative for falls and myalgias.  Skin:  Negative for itching and rash.  Neurological:  Negative for dizziness, tingling, tremors, speech change, seizures, loss of consciousness, weakness and headaches.  Endo/Heme/Allergies:  Negative for polydipsia. Does not bruise/bleed easily.  Psychiatric/Behavioral:  Negative for depression, hallucinations, memory loss, substance abuse and suicidal ideas. The patient does not have insomnia.   All other systems reviewed and are negative.    Past Surgical History:  Procedure Laterality Date   ANAL RECTAL MANOMETRY N/A 03/14/2021   Procedure: ANO RECTAL MANOMETRY;  Surgeon: Eda Iha, MD;  Location: [facility];  Service: Gastroenterology;  Laterality: N/A;   CERVICAL SPINE SURGERY  06/15/2010   COLONOSCOPY  2005   normal   LEFT HEART CATH AND CORONARY ANGIOGRAPHY N/A 09/16/2017   Procedure: LEFT HEART CATH AND CORONARY ANGIOGRAPHY;  Surgeon: Claudene Pacific, MD;  Location: [facility];  Service: Cardiovascular;  Laterality: N/A;    Current Outpatient Medications on File Prior to Visit  Medication Sig Dispense Refill   acetaminophen  (TYLENOL ) 325 MG tablet Take 325 mg by mouth every 6 (six) hours as needed for moderate pain.     aspirin  EC 81 MG tablet Take 81 mg by mouth at bedtime.     Cholecalciferol (DIALYVITE VITAMIN D 5000) 125 MCG (5000 UT) capsule Take 5,000 Units by mouth at bedtime.     Evolocumab  (REPATHA  SURECLICK) 140 MG/ML SOAJ INJECT 1 ML SUBCUTANEOUSLY  EVERY TWO WEEKS 6 mL 1   FARXIGA 10 MG TABS tablet Take 10 mg by mouth daily.     Ferrous Sulfate (IRON SLOW RELEASE PO) Take 1 tablet by mouth daily.     lisinopril  (ZESTRIL ) 10 MG tablet Take 10 mg by mouth daily.     polyethylene glycol (MIRALAX / GLYCOLAX) packet Take 17 g by mouth daily as needed  (constipation). Mix in 8 oz liquid and drink     triamcinolone  cream (KENALOG ) 0.1 % Apply 1 Application topically 2 (two) times daily. 30 g 0   vitamin B-12 (CYANOCOBALAMIN ) 1000 MCG tablet Take 1,000 mcg by mouth at bedtime.      No current facility-administered medications on file prior to visit.    Family History  Problem Relation Age of Onset   Stroke Mother    Hypertension Mother    Heart disease Mother    Heart disease Father    Heart attack Father    Heart disease Sister    Cancer Sister        Lung   Kidney disease Sister    Breast cancer Sister    Hypertension Daughter    Cancer Brother        Lung   Cirrhosis Brother    Stroke Brother    Heart disease Brother    Heart disease Brother    Colon cancer Neg Hx    Colon polyps Neg Hx    Esophageal cancer Neg Hx    Rectal cancer Neg Hx    Stomach cancer Neg Hx     Social History   Socioeconomic History   Marital status: Widowed    Spouse name: Not on file   Number of children: 3   Years of education: Not on file   Highest education level: Some college, no degree  Occupational History   Occupation: Comptroller: [employer]  Tobacco Use   Smoking status: Never  Smokeless tobacco: Never  Vaping Use   Vaping status: Never Used  Substance and Sexual Activity   Alcohol use: No    Comment: glass of wine-special occasions   Drug use: No   Sexual activity: Never    Birth control/protection: Abstinence  Other Topics Concern   Not on file  Social History Narrative   Not on file   Social Drivers of Health   Financial Resource Strain: Low Risk  (02/03/2023)   Overall Financial Resource Strain (CARDIA)    Difficulty of Paying Living Expenses: Not hard at all  Food Insecurity: No Food Insecurity (02/03/2023)   Hunger Vital Sign    Worried About Running Out of Food in the Last Year: Never true    Ran Out of Food in the Last Year: Never true  Transportation Needs: No Transportation Needs (02/03/2023)    PRAPARE - Administrator, Civil Service (Medical): No    Lack of Transportation (Non-Medical): No  Physical Activity: Inactive (02/03/2023)   Exercise Vital Sign    Days of Exercise per Week: 0 days    Minutes of Exercise per Session: 0 min  Stress: No Stress Concern Present (02/03/2023)   [employer] of Occupational Health - Occupational Stress Questionnaire    Feeling of Stress : Only a little  Social Connections: Moderately Isolated (02/03/2023)   Social Connection and Isolation Panel    Frequency of Communication with Friends and Family: More than three times a week    Frequency of Social Gatherings with Friends and Family: Once a week    Attends Religious Services: More than 4 times per year    Active Member of [employer] or Organizations: No    Attends Banker Meetings: Never    Marital Status: Widowed  Intimate Partner Violence: Not At Risk (02/03/2023)   Humiliation, Afraid, Rape, and Kick questionnaire    Fear of Current or Ex-Partner: No    Emotionally Abused: No    Physically Abused: No    Sexually Abused: No                                                                                                  Objective:  Physical Exam: BP 118/60 (BP Location: Right Arm, Patient Position: Sitting, Cuff Size: Large)   Pulse 74   Temp (!) 97 F (36.1 C) (Temporal)   Resp 18   Wt 179 lb 12.8 oz (81.6 kg)   SpO2 100%   BMI 28.16 kg/m    Physical Exam GENERAL: Alert, cooperative, well developed, no acute distress. HEENT: Normocephalic, normal oropharynx, moist mucous membranes. CHEST: Clear to auscultation bilaterally, no wheezes, rhonchi, or crackles. CARDIOVASCULAR: Normal heart rate and rhythm, S1 and S2 normal without murmurs. ABDOMEN: Soft, non-tender, non-distended, without organomegaly, normal bowel sounds. EXTREMITIES: No cyanosis or edema. MUSCULOSKELETAL: Left shoulder pain on movement, especially for flexion above neck level ,no pain on  palpation. NEUROLOGICAL: Cranial nerves grossly intact, moves all extremities without gross motor or sensory deficit.   Physical Exam  No results found.  Recent Results (from the past 2160 hours)  CBC with Differential (Cancer Center Only)     Status: None   Collection Time: 11/18/23  1:48 PM  Result Value Ref Range   WBC Count 8.2 4.0 - 10.5 K/uL   RBC 4.20 3.87 - 5.11 MIL/uL   Hemoglobin 12.1 12.0 - 15.0 g/dL   HCT 62.1 63.9 - 53.9 %   MCV 90.0 80.0 - 100.0 fL   MCH 28.8 26.0 - 34.0 pg   MCHC 32.0 30.0 - 36.0 g/dL   RDW 86.6 88.4 - 84.4 %   Platelet Count 228 150 - 400 K/uL   nRBC 0.0 0.0 - 0.2 %   Neutrophils Relative % 56 %   Neutro Abs 4.4 1.7 - 7.7 K/uL   Lymphocytes Relative 32 %   Lymphs Abs 2.7 0.7 - 4.0 K/uL   Monocytes Relative 10 %   Monocytes Absolute 0.8 0.1 - 1.0 K/uL   Eosinophils Relative 2 %   Eosinophils Absolute 0.2 0.0 - 0.5 K/uL   Basophils Relative 0 %   Basophils Absolute 0.0 0.0 - 0.1 K/uL   Immature Granulocytes 0 %   Abs Immature Granulocytes 0.03 0.00 - 0.07 K/uL    Comment: Performed at [facility], [address] [place], [place] [address]  CMP (Cancer Center only)     Status: Abnormal   Collection Time: 11/18/23  1:48 PM  Result Value Ref Range   Sodium 140 135 - 145 mmol/L   Potassium 4.3 3.5 - 5.1 mmol/L   Chloride 104 98 - 111 mmol/L   CO2 30 22 - 32 mmol/L    Comment: (NOTE) Elevated LDH levels may cause falsely increased CO2 results. If LDH is >2000 U/L, a positive bias of 12% is possible.     Glucose, Bld 82 70 - 99 mg/dL    Comment: Glucose reference range applies only to samples taken after fasting for at least 8 hours.   BUN 24 (H) 8 - 23 mg/dL   Creatinine 8.66 (H) 9.55 - 1.00 mg/dL   Calcium  10.2 8.9 - 10.3 mg/dL   Total Protein 7.9 6.5 - 8.1 g/dL   Albumin 4.2 3.5 - 5.0 g/dL   AST 14 (L) 15 - 41 U/L   ALT 7 0 - 44 U/L   Alkaline Phosphatase 71 38 - 126 U/L   Total Bilirubin 0.4 0.0 - 1.2  mg/dL   GFR, Estimated 41 (L) >60 mL/min    Comment: (NOTE) Calculated using the CKD-EPI Creatinine Equation (2021)    Anion gap 6 5 - 15    Comment: Performed at [facility], [address] [place], [place] [address]  Kappa/lambda light chains     Status: Abnormal   Collection Time: 11/18/23  1:48 PM  Result Value Ref Range   Kappa free light chain 36.0 (H) 3.3 - 19.4 mg/L   Lambda free light chains 27.5 (H) 5.7 - 26.3 mg/L   Kappa, lambda light chain ratio 1.31 0.26 - 1.65    Comment: (NOTE) Performed At: [facility] [address] [place], [place] [id number] Jennette Shorter MD Ey:[id number]   IgG, IgA, IgM     Status: Abnormal   Collection Time: 11/18/23  1:48 PM  Result Value Ref Range   IgG (Immunoglobin G), Serum 1,661 (H) 586 - 1,602 mg/dL   IgA 761 64 - 577 mg/dL   IgM (Immunoglobulin M), Srm 76 26 - 217 mg/dL    Comment: (NOTE) Performed At: [facility] [address]  [address] [place], [place] [id number] Jennette Shorter MD Ey:[id number]         Beverley KATHEE Hummer, MD  I,Emily Lagle,acting as a scribe for Beverley KATHEE Hummer, MD.,have documented all relevant documentation on the behalf of Beverley KATHEE Hummer, MD.  LILLETTE Beverley KATHEE Hummer, MD, have reviewed all documentation for this visit. The documentation on 11/21/2023 for the exam, diagnosis, procedures, and orders are all accurate and complete.

## 2023-11-21 NOTE — Patient Instructions (Addendum)
 It was very nice to see you today!  VISIT SUMMARY: Today, we addressed your severe left shoulder pain, which has been troubling you for the past week.   YOUR PLAN: LEFT SHOULDER PAIN: You have been experiencing severe pain in your left shoulder for the past week, which worsens with certain movements. -We ordered a shoulder x-ray to check for arthritis or other structural issues. -You have been prescribed a prednisone  course pack to reduce inflammation and provide relief. -You have been prescribed diclofenac gel for additional pain relief. -Take the steroids with food and consider taking Pepcid  if you experience indigestion. -Continue using Tylenol  as needed for pain relief. -Avoid using ibuprofen or naproxen due to your kidney condition.  Return if symptoms worsen or fail to improve.   Take care, Arvella Hummer, MD, MS   PLEASE NOTE:  If you had any lab tests, please let us  know if you have not heard back within a few days. You may see your results on mychart before we have a chance to review them but we will give you a call once they are reviewed by us .   If we ordered any referrals today, please let us  know if you have not heard from their office within the next week.   If you had any urgent prescriptions sent in today, please check with the pharmacy within an hour of our visit to make sure the prescription was transmitted appropriately.   Please try these tips to maintain a healthy lifestyle:  Eat at least 3 REAL meals and 1-2 snacks per day.  Aim for no more than 5 hours between eating.  If you eat breakfast, please do so within one hour of getting up.   Each meal should contain half fruits/vegetables, one quarter protein, and one quarter carbs (no bigger than a computer mouse)  Cut down on sweet beverages. This includes juice, soda, and sweet tea.   Drink at least 1 glass of water with each meal and aim for at least 8 glasses per day  Exercise at least 150 minutes every  week.

## 2023-11-23 ENCOUNTER — Encounter: Payer: Self-pay | Admitting: Family Medicine

## 2023-11-26 ENCOUNTER — Ambulatory Visit: Payer: Self-pay | Admitting: Family Medicine

## 2023-11-26 LAB — PROTEIN ELECTROPHORESIS, SERUM, WITH REFLEX
A/G Ratio: 0.9 (ref 0.7–1.7)
Albumin ELP: 3.5 g/dL (ref 2.9–4.4)
Alpha-1-Globulin: 0.3 g/dL (ref 0.0–0.4)
Alpha-2-Globulin: 0.9 g/dL (ref 0.4–1.0)
Beta Globulin: 1.1 g/dL (ref 0.7–1.3)
Gamma Globulin: 1.6 g/dL (ref 0.4–1.8)
Globulin, Total: 3.9 g/dL (ref 2.2–3.9)
M-Spike, %: 0.4 g/dL — ABNORMAL HIGH
SPEP Interpretation: 0
Total Protein ELP: 7.4 g/dL (ref 6.0–8.5)

## 2023-11-26 LAB — IMMUNOFIXATION REFLEX, SERUM
IgA: 236 mg/dL (ref 64–422)
IgG (Immunoglobin G), Serum: 1653 mg/dL — ABNORMAL HIGH (ref 586–1602)
IgM (Immunoglobulin M), Srm: 79 mg/dL (ref 26–217)

## 2023-11-27 ENCOUNTER — Ambulatory Visit: Payer: Self-pay

## 2023-12-01 NOTE — Progress Notes (Unsigned)
 [name] OFFICE PROGRESS NOTE  Patient Care Team: [name], [name] as PCP - General (Family Medicine) [name], [name], [name] (Endocrinology) [name], [name] (Gastroenterology) [name], [name] (Inactive) (Neurosurgery) [name], [name] as Attending Physician (Cardiology) [name], [name] as Attending Physician (Nephrology) [name], [name] as Consulting Physician (Physical Medicine and Rehabilitation) [name], [name] (Inactive) as Consulting Physician (Oncology)  78 y.o.female with history of degenerative disc disease, osteoarthritis, GERD, CKD, HTN, HLD, arthritis referred to [facility] for MGUS.   MGUS diagnosed in 2022.  She was found to have IgG kappa with M spike of 0.2 g/dL   Overall at low level.  Assessment & Plan MGUS (monoclonal gammopathy of unknown significance) Stable Repeat in about 6 months with CBC, CMP, LDH, immunoglobulin panel, SPEP and free light chain 2 weeks before visit  No orders of the defined types were placed in this encounter.    Jo [name]  INTERVAL HISTORY: Patient returns for follow-up.  Oncology History   No history exists.     PHYSICAL EXAMINATION: ECOG PERFORMANCE STATUS: {CHL ONC ECOG PS:[id number]}  There were no vitals filed for this visit. There were no vitals filed for this visit.  Relevant data reviewed during this visit included ***

## 2023-12-01 NOTE — Assessment & Plan Note (Addendum)
 Stable Repeat in about 6 months with CBC, CMP, LDH, immunoglobulin panel, SPEP and free light chain 2 weeks before visit

## 2023-12-02 ENCOUNTER — Inpatient Hospital Stay

## 2023-12-02 DIAGNOSIS — D472 Monoclonal gammopathy: Secondary | ICD-10-CM

## 2023-12-02 NOTE — Assessment & Plan Note (Addendum)
 Stable, no signs of worsening Continue adequate hydration.

## 2023-12-23 NOTE — Assessment & Plan Note (Signed)
 Stable, no signs of worsening Continue adequate hydration.

## 2023-12-23 NOTE — Assessment & Plan Note (Addendum)
 Stable Repeat in about May with CBC, CMP, LDH, immunoglobulin panel, SPEP and free light chain 2 weeks before visit

## 2023-12-23 NOTE — Progress Notes (Unsigned)
 [facility] OFFICE PROGRESS NOTE  Patient Care Team: [name], MD as PCP - General (Family Medicine) [name], [name], MD (Endocrinology) [name], MD (Gastroenterology) [name], MD (Inactive) (Neurosurgery) [name], MD as Attending Physician (Cardiology) [name], MD as Attending Physician (Nephrology) [name], MD as Consulting Physician (Physical Medicine and Rehabilitation) [name], MD (Inactive) as Consulting Physician (Oncology)  78 y.o.female with history of degenerative disc disease, osteoarthritis, GERD, CKD, HTN, HLD, arthritis referred to [facility] for MGUS.   MGUS diagnosed in 2022.  She was found to have IgG kappa with M spike of 0.2 g/dL   Overall at low level M protein at 0.4.  Kidney function appears stable with chronic kidney disease.  Hemoglobin, total protein, albumin and calcium  are normal. Assessment & Plan MGUS (monoclonal gammopathy of unknown significance) Stable Repeat in about May with CBC, CMP, LDH, immunoglobulin panel, SPEP and free light chain 2 weeks before visit  No orders of the defined types were placed in this encounter.    Pauletta BROCKS [place], MD  INTERVAL HISTORY: Patient returns for follow-up. Overall her labs are stable.  MGUS history: MGUS diagnosed in 2022.  She was found to have IgG kappa with M spike of 0.2 g/dL without evidence of endorgan damage to suggest multiple myeloma.   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   June 21 2022:  Scheduled follow up for MGUS. M-protein 0.4 g/dL. Immunofixation shows IgG monoclonal protein with kappa light chain specificity. Kappa 42. Lambda 29. K/L 1.49   12/13/22 K 27. M-protein 0.3 g/dL. Immunofixation shows IgG monoclonal protein with kappa light chain specificity. L 19. K/L 1.36. hgb 11.4.  Normal IgG, IgM, IgA   She has chronic right hip pain and waiting to start  injection. Report she was told having pinched nerve. She using heat and ice and able to manage most day. No new pain.    11/10/22 MRI L spine:  IMPRESSION: 1. Diffuse lumbar spine spondylosis as described above. 2. No acute osseous injury of the lumbar spine.   Latest Reference Range & Units Most Recent 12/13/22 09:53 04/07/23 08:39  M Protein SerPl Elph-Mcnc Not Observed g/dL 0.3 (H) (C) 6/75/74 91:60 0.3 (H) (C) 0.3 (H) (C)  (H): Data is abnormally high (C): Corrected   Latest Reference Range & Units 08/11/23 15:00 11/18/23 13:48  M-SPIKE, % Not Observed g/dL 0.3 (H) 0.4 (H)  (H): Data is abnormally high  PHYSICAL EXAMINATION: ECOG PERFORMANCE STATUS:   There were no vitals filed for this visit. There were no vitals filed for this visit.  Relevant data reviewed during this visit included ***

## 2023-12-25 ENCOUNTER — Inpatient Hospital Stay: Attending: Internal Medicine

## 2023-12-25 VITALS — BP 130/63 | HR 68 | Temp 98.2°F | Resp 16 | Ht 67.0 in | Wt 182.6 lb

## 2023-12-25 DIAGNOSIS — N1832 Chronic kidney disease, stage 3b: Secondary | ICD-10-CM | POA: Insufficient documentation

## 2023-12-25 DIAGNOSIS — D472 Monoclonal gammopathy: Secondary | ICD-10-CM | POA: Insufficient documentation

## 2023-12-25 DIAGNOSIS — D631 Anemia in chronic kidney disease: Secondary | ICD-10-CM | POA: Insufficient documentation

## 2023-12-30 ENCOUNTER — Other Ambulatory Visit: Payer: Self-pay | Admitting: Family Medicine

## 2023-12-30 DIAGNOSIS — Z1231 Encounter for screening mammogram for malignant neoplasm of breast: Secondary | ICD-10-CM

## 2023-12-31 ENCOUNTER — Ambulatory Visit: Payer: Self-pay

## 2023-12-31 NOTE — Telephone Encounter (Signed)
 FYI Only or Action Required?: Action required by provider: pt is under the impression she needs an appt with PCP in order to schedule mammogram. Please reach out to patient and advise.  Patient was last seen in primary care on 11/21/2023 by Sebastian Beverley NOVAK, MD.  Called Nurse Triage reporting Breast Pain.  Symptoms began several weeks ago.  Interventions attempted: Nothing.  Symptoms are: unchanged.  Triage Disposition: See PCP Within 2 Weeks  Patient/caregiver understands and will follow disposition?: Yes  Copied from CRM #[id number]. Topic: Clinical - Red Word Triage >> Dec 31, 2023 10:21 AM Drema MATSU wrote: Red Word that prompted transfer to Nurse Triage: Patient has pain in the left breast.She states that it is a nagging pain and sometimes intense. Patient was told that she need to schedule an appointment with pcp regarding getting a mammogram. Reason for Disposition  [1] Breast pain AND [2] cause is not known  Answer Assessment - Initial Assessment Questions 1. SYMPTOM: What's the main symptom you're concerned about?  (e.g., lump, nipple discharge, pain, rash)     Breast tingling to left breast x a few weeks ago, dull kind of pain per patient, intermittent 2. LOCATION: Where is the pain located?     Left breast near armpit 3. ONSET: When did pain  start?     Couple weeks 4. PRIOR HISTORY: Do you have any history of prior problems with your breasts? (e.g., breast cancer, breast implant, fibrocystic breast disease)     Denies      6. OTHER SYMPTOMS: Do you have any other symptoms? (e.g., breast pain, fever, nipple discharge, redness or rash)     Denies  Pt reports history of arthritis to left shoulder  Protocols used: Breast Symptoms-A-AH

## 2023-12-31 NOTE — Telephone Encounter (Signed)
 Pt scheduled with Dr. Thedora on 01/30/2023. Please contact patient and schedule patient for mammogram bus for 01/12

## 2024-01-26 ENCOUNTER — Encounter

## 2024-01-30 ENCOUNTER — Ambulatory Visit (INDEPENDENT_AMBULATORY_CARE_PROVIDER_SITE_OTHER): Admitting: Family Medicine

## 2024-01-30 ENCOUNTER — Other Ambulatory Visit: Payer: Self-pay | Admitting: Family Medicine

## 2024-01-30 VITALS — BP 128/76 | HR 92 | Temp 98.4°F | Ht 67.0 in | Wt 178.4 lb

## 2024-01-30 DIAGNOSIS — N644 Mastodynia: Secondary | ICD-10-CM | POA: Diagnosis not present

## 2024-01-30 DIAGNOSIS — Z23 Encounter for immunization: Secondary | ICD-10-CM

## 2024-01-30 NOTE — Progress Notes (Signed)
 " [facility] PRIMARY CARE [facility] [address] [place] [address] Dept: [phone] Dept Fax: [phone]  Office Visit  Subjective:    [name], female    DOB: Sep 17, 1945, 79 y.o..   MRN: [id number]  Chief Complaint  Patient presents with   Breast Pain    C/o having LT breast pain x 1 month on/off.     History of Present Illness:  Patient is in today for left breast pain ongoing for a couple of weeks. She describes a deep achy sensation in the upper outer quadrant of the left breast. She has not see any swelling or redness. She denies nipple discharge. She has had no skin changes. She is not feeling any masses. She notes she had a relative with vague breast pain that ignored it and subsequently had breast cancer.  Past Medical History: Patient Active Problem List   Diagnosis Date Noted   Spinal stenosis of lumbar region with neurogenic claudication 03/05/2023   Sensorineural hearing loss (SNHL) of left ear with unrestricted hearing of right ear 02/12/2023   Statin intolerance 11/12/2022   Degeneration of intervertebral disc of lumbar region with discogenic back pain and lower extremity pain 10/22/2022   Right lumbar radiculitis 10/17/2022   Stage 3b chronic kidney disease (HCC) 11/05/2021   Constipation due to outlet dysfunction    Normocytic anemia 01/30/2021   MGUS (monoclonal gammopathy of unknown significance) 10/18/2020   Unilateral primary osteoarthritis, right knee 05/24/2020   Hyperlipidemia 10/11/2019   Carotid stenosis 10/11/2019   Pre-diabetes 10/25/2014   Essential hypertension    Past Surgical History:  Procedure Laterality Date   ANAL RECTAL MANOMETRY N/A 03/14/2021   Procedure: ANO RECTAL MANOMETRY;  Surgeon: Eda Iha, MD;  Location: [facility];  Service: Gastroenterology;  Laterality: N/A;   CERVICAL SPINE SURGERY  06/15/2010   COLONOSCOPY  2005   normal   LEFT HEART CATH AND CORONARY  ANGIOGRAPHY N/A 09/16/2017   Procedure: LEFT HEART CATH AND CORONARY ANGIOGRAPHY;  Surgeon: Claudene Pacific, MD;  Location: [facility];  Service: Cardiovascular;  Laterality: N/A;   Family History  Problem Relation Age of Onset   Stroke Mother    Hypertension Mother    Heart disease Mother    Heart disease Father    Heart attack Father    Heart disease Sister    Cancer Sister        Lung   Kidney disease Sister    Breast cancer Sister    Hypertension Daughter    Cancer Brother        Lung   Cirrhosis Brother    Stroke Brother    Heart disease Brother    Heart disease Brother    Colon cancer Neg Hx    Colon polyps Neg Hx    Esophageal cancer Neg Hx    Rectal cancer Neg Hx    Stomach cancer Neg Hx    Outpatient Medications Prior to Visit  Medication Sig Dispense Refill   acetaminophen  (TYLENOL ) 325 MG tablet Take 325 mg by mouth every 6 (six) hours as needed for moderate pain.     aspirin  EC 81 MG tablet Take 81 mg by mouth at bedtime.     Cholecalciferol (DIALYVITE VITAMIN D 5000) 125 MCG (5000 UT) capsule Take 5,000 Units by mouth at bedtime.     diclofenac  Sodium (VOLTAREN ) 1 % GEL Apply 4 g topically 4 (four) times daily as needed. 100 g 3  Evolocumab  (REPATHA  SURECLICK) 140 MG/ML SOAJ INJECT 1 ML SUBCUTANEOUSLY  EVERY TWO WEEKS 6 mL 1   FARXIGA 10 MG TABS tablet Take 10 mg by mouth daily.     Ferrous Sulfate (IRON SLOW RELEASE PO) Take 1 tablet by mouth daily.     lisinopril  (ZESTRIL ) 10 MG tablet Take 10 mg by mouth daily.     polyethylene glycol (MIRALAX / GLYCOLAX) packet Take 17 g by mouth daily as needed (constipation). Mix in 8 oz liquid and drink     triamcinolone  cream (KENALOG ) 0.1 % Apply 1 Application topically 2 (two) times daily. 30 g 0   vitamin B-12 (CYANOCOBALAMIN ) 1000 MCG tablet Take 1,000 mcg by mouth at bedtime.      No facility-administered medications prior to visit.   Allergies[1]   Objective:   Today's Vitals   01/30/24 1409  BP:  128/76  Pulse: 92  Temp: 98.4 F (36.9 C)  TempSrc: Temporal  SpO2: 99%  Weight: 178 lb 6.4 oz (80.9 kg)  Height: 5' 7 (1.702 m)   Body mass index is 27.94 kg/m.   General: Well developed, well nourished. No acute distress. Breast: Chaperone present for exam. Left breast has a normal contour. Skin, areola, and nipple are normal. No massess   palpated in breast or axilla. Psych: Alert and oriented. Normal mood and affect.  Health Maintenance Due  Topic Date Due   DTaP/Tdap/Td (2 - Td or Tdap) 04/15/2019   Zoster Vaccines- Shingrix (2 of 2) 12/26/2022   Influenza Vaccine  08/15/2023   COVID-19 Vaccine (4 - 2025-26 season) 09/15/2023   Medicare Annual Wellness (AWV)  02/03/2024     Assessment & Plan:   Problem List Items Addressed This Visit   None Visit Diagnoses       Breast pain- Left    -  Primary   I reassured Ms. Rosalva that her exam is normal. I will order diagnostic mammograms.   Relevant Orders   MM 3D DIAGNOSTIC MAMMOGRAM BILATERAL BREAST     Need for immunization against influenza       Relevant Orders   Flu vaccine HIGH DOSE PF(Fluzone Trivalent) (Completed)       Return in about 4 months (around 05/29/2024) for Reassessment.    Garnette CHRISTELLA Simpler, MD  I,Emily Lagle,acting as a scribe for Garnette CHRISTELLA Simpler, MD.,have documented all relevant documentation on the behalf of Garnette CHRISTELLA Simpler, MD.  I, Garnette CHRISTELLA Simpler, MD, have reviewed all documentation for this visit. The documentation on 01/30/2024 for the exam, diagnosis, procedures, and orders are all accurate and complete.     [1]  Allergies Allergen Reactions   Demerol Nausea And Vomiting    Severe nausea and vomiting   Crestor  [Rosuvastatin ]     Fatigue. Sob, myalgias, restless legs   Lipitor [Atorvastatin ]     myalgias   "

## 2024-02-06 ENCOUNTER — Ambulatory Visit: Payer: Medicare Other

## 2024-02-10 ENCOUNTER — Ambulatory Visit

## 2024-02-10 DIAGNOSIS — Z0000 Encounter for general adult medical examination without abnormal findings: Secondary | ICD-10-CM

## 2024-02-10 NOTE — Progress Notes (Signed)
 "  Chief Complaint  Patient presents with   Medicare Wellness     Subjective:   Jo [name] is a 79 y.o. female who presents for a Medicare Annual Wellness Visit.  Visit info / Clinical Intake: Medicare Wellness Visit Type:: Subsequent Annual Wellness Visit Persons participating in visit and providing information:: patient Medicare Wellness Visit Mode:: Telephone If telephone:: video declined Since this visit was completed virtually, some vitals may be partially provided or unavailable. Missing vitals are due to the limitations of the virtual format.: Unable to obtain vitals - no equipment If Telephone or Video please confirm:: I connected with patient using audio/video enable telemedicine. I verified patient identity with two identifiers, discussed telehealth limitations, and patient agreed to proceed. Patient Location:: home Provider Location:: home office Interpreter Needed?: No Pre-visit prep was completed: yes AWV questionnaire completed by patient prior to visit?: no Living arrangements:: with family/others Patient's Overall Health Status Rating: good Typical amount of pain: some Does pain affect daily life?: (!) yes Are you currently prescribed opioids?: no  Dietary Habits and Nutritional Risks How many meals a day?: 3 Eats fruit and vegetables daily?: yes Most meals are obtained by: preparing own meals In the last 2 weeks, have you had any of the following?: none Diabetic:: no  Functional Status Activities of Daily Living (to include ambulation/medication): Independent Ambulation: Independent Medication Administration: Independent Home Management (perform basic housework or laundry): Independent Manage your own finances?: yes Primary transportation is: driving Concerns about vision?: no *vision screening is required for WTM* Concerns about hearing?: (!) yes (decreased hearing left ear sometimes) Uses hearing aids?: no  Fall Screening Falls in the past  year?: 0 Number of falls in past year: 0 Was there an injury with Fall?: 0 Fall Risk Category Calculator: 0 Patient Fall Risk Level: Low Fall Risk  Fall Risk Patient at Risk for Falls Due to: Medication side effect Fall risk Follow up: Falls prevention discussed; Education provided; Falls evaluation completed  Home and Transportation Safety: All rugs have non-skid backing?: yes All stairs or steps have railings?: yes Grab bars in the bathtub or shower?: (!) no Have non-skid surface in bathtub or shower?: yes Good home lighting?: yes Regular seat belt use?: yes Hospital stays in the last year:: no  Cognitive Assessment Difficulty concentrating, remembering, or making decisions? : no Will 6CIT or Mini Cog be Completed: yes What year is it?: 0 points What month is it?: 0 points Give patient an address phrase to remember (5 components): [address] [place] About what time is it?: 0 points Count backwards from 20 to 1: 0 points Say the months of the year in reverse: 0 points Repeat the address phrase from earlier: 0 points 6 CIT Score: 0 points  Advance Directives (For Healthcare) Does Patient Have a Medical Advance Directive?: No  Reviewed/Updated  Reviewed/Updated: Reviewed All (Medical, Surgical, Family, Medications, Allergies, Care Teams, Patient Goals)    Allergies (verified) Demerol, Crestor  [rosuvastatin ], and Lipitor [atorvastatin ]   Current Medications (verified) Outpatient Encounter Medications as of 02/10/2024  Medication Sig   acetaminophen  (TYLENOL ) 325 MG tablet Take 325 mg by mouth every 6 (six) hours as needed for moderate pain.   aspirin  EC 81 MG tablet Take 81 mg by mouth at bedtime.   Cholecalciferol (DIALYVITE VITAMIN D 5000) 125 MCG (5000 UT) capsule Take 5,000 Units by mouth at bedtime.   diclofenac  Sodium (VOLTAREN ) 1 % GEL Apply 4 g topically 4 (four) times daily as needed.  Evolocumab  (REPATHA  SURECLICK) 140 MG/ML SOAJ INJECT 1 ML  SUBCUTANEOUSLY  EVERY TWO WEEKS   FARXIGA 10 MG TABS tablet Take 10 mg by mouth daily.   Ferrous Sulfate (IRON SLOW RELEASE PO) Take 1 tablet by mouth daily.   lisinopril  (ZESTRIL ) 10 MG tablet Take 10 mg by mouth daily.   polyethylene glycol (MIRALAX / GLYCOLAX) packet Take 17 g by mouth daily as needed (constipation). Mix in 8 oz liquid and drink   triamcinolone  cream (KENALOG ) 0.1 % Apply 1 Application topically 2 (two) times daily.   vitamin B-12 (CYANOCOBALAMIN ) 1000 MCG tablet Take 1,000 mcg by mouth at bedtime.    No facility-administered encounter medications on file as of 02/10/2024.    History: Past Medical History:  Diagnosis Date   Allergy    Anemia    Arthritis    right knee   Carotid bruit 10/11/2019   Carotid stenosis 10/11/2019   Cataract    forming both   Chronic kidney disease    GERD (gastroesophageal reflux disease)    past hx   Goiter    Hyperlipidemia    Hypertension    Lower extremity edema 10/11/2019   Menopause age 45   Pure hypercholesterolemia 10/11/2019   Reflux    Shortness of breath 12/21/2020   Spinal stenosis    Past Surgical History:  Procedure Laterality Date   ANAL RECTAL MANOMETRY N/A 03/14/2021   Procedure: ANO RECTAL MANOMETRY;  Surgeon: Eda Iha, MD;  Location: [facility];  Service: Gastroenterology;  Laterality: N/A;   CERVICAL SPINE SURGERY  06/15/2010   COLONOSCOPY  2005   normal   LEFT HEART CATH AND CORONARY ANGIOGRAPHY N/A 09/16/2017   Procedure: LEFT HEART CATH AND CORONARY ANGIOGRAPHY;  Surgeon: Claudene Pacific, MD;  Location: [facility];  Service: Cardiovascular;  Laterality: N/A;   Family History  Problem Relation Age of Onset   Stroke Mother    Hypertension Mother    Heart disease Mother    Heart disease Father    Heart attack Father    Heart disease Sister    Cancer Sister        Lung   Kidney disease Sister    Breast cancer Sister    Hypertension Daughter    Cancer Brother        Lung    Cirrhosis Brother    Stroke Brother    Heart disease Brother    Heart disease Brother    Colon cancer Neg Hx    Colon polyps Neg Hx    Esophageal cancer Neg Hx    Rectal cancer Neg Hx    Stomach cancer Neg Hx    Social History   Occupational History   Occupation: Comptroller: [employer]  Tobacco Use   Smoking status: Never   Smokeless tobacco: Never  Vaping Use   Vaping status: Never Used  Substance and Sexual Activity   Alcohol use: No    Comment: glass of wine-special occasions   Drug use: No   Sexual activity: Never    Birth control/protection: Abstinence   Tobacco Counseling Counseling given: Not Answered  SDOH Screenings   Food Insecurity: No Food Insecurity (02/10/2024)  Housing: Unknown (02/10/2024)  Transportation Needs: No Transportation Needs (02/10/2024)  Utilities: Not At Risk (02/10/2024)  Alcohol Screen: Low Risk (02/10/2024)  Depression (PHQ2-9): Low Risk (02/10/2024)  Financial Resource Strain: Low Risk (02/10/2024)  Physical Activity: Insufficiently Active (02/10/2024)  Social Connections: Moderately Isolated (02/10/2024)  Stress: No  Stress Concern Present (02/10/2024)  Tobacco Use: Low Risk (02/10/2024)  Health Literacy: Adequate Health Literacy (02/10/2024)   See flowsheets for full screening details  Depression Screen PHQ 2 & 9 Depression Scale- Over the past 2 weeks, how often have you been bothered by any of the following problems? Little interest or pleasure in doing things: 0 Feeling down, depressed, or hopeless (PHQ Adolescent also includes...irritable): 0 PHQ-2 Total Score: 0 Trouble falling or staying asleep, or sleeping too much: 0 Feeling tired or having little energy: 0 Poor appetite or overeating (PHQ Adolescent also includes...weight loss): 0 Feeling bad about yourself - or that you are a failure or have let yourself or your family down: 0 Trouble concentrating on things, such as reading the newspaper or watching television (PHQ  Adolescent also includes...like school work): 0 Moving or speaking so slowly that other people could have noticed. Or the opposite - being so fidgety or restless that you have been moving around a lot more than usual: 0 Thoughts that you would be better off dead, or of hurting yourself in some way: 0 PHQ-9 Total Score: 0 If you checked off any problems, how difficult have these problems made it for you to do your work, take care of things at home, or get along with other people?: Not difficult at all  Depression Treatment Depression Interventions/Treatment : EYV7-0 Score <4 Follow-up Not Indicated     Goals Addressed               This Visit's Progress     wants to get weight down a little more (pt-stated)               Objective:    Today's Vitals   There is no height or weight on file to calculate BMI.  Hearing/Vision screen Vision Screening - Comments:: Regular eye exams, [facility] Immunizations and Health Maintenance Health Maintenance  Topic Date Due   DTaP/Tdap/Td (2 - Td or Tdap) 04/15/2019   Zoster Vaccines- Shingrix (2 of 2) 12/26/2022   COVID-19 Vaccine (4 - 2025-26 season) 02/15/2024 (Originally 09/15/2023)   Medicare Annual Wellness (AWV)  02/09/2025   Pneumococcal Vaccine: 50+ Years  Completed   Influenza Vaccine  Completed   Bone Density Scan  Completed   Hepatitis C Screening  Completed   Meningococcal B Vaccine  Aged Out   Mammogram  Discontinued   Fecal DNA (Cologuard)  Discontinued        Assessment/Plan:  This is a routine wellness examination for Jo [name].  Patient Care Team: [name], MD as PCP - General (Family Medicine) [name], [name], MD (Endocrinology) [name], MD (Gastroenterology) [name], MD (Inactive) (Neurosurgery) [name], MD as Attending Physician (Cardiology) [name], MD as Attending Physician (Nephrology) [name], MD as Consulting Physician (Physical Medicine and  Rehabilitation) Tina Pauletta BROCKS, MD as Consulting Physician (Oncology)  I have personally reviewed and noted the following in the patients chart:   Medical and social history Use of alcohol, tobacco or illicit drugs  Current medications and supplements including opioid prescriptions. Functional ability and status Nutritional status Physical activity Advanced directives List of other physicians Hospitalizations, surgeries, and ER visits in previous 12 months Vitals Screenings to include cognitive, depression, and falls Referrals and appointments  No orders of the defined types were placed in this encounter.  In addition, I have reviewed and discussed with patient certain preventive protocols, quality metrics, and best practice recommendations. A written personalized care plan for  preventive services as well as general preventive health recommendations were provided to patient.   Jo FORBES Dawn, LPN   8/72/7973   Return in 1 year (on 02/09/2025).  After Visit Summary: (Pick Up) Due to this being a telephonic visit, with patients personalized plan was offered to patient and patient has requested to Pick up at office.  Nurse Notes: HM Addressed: Vaccines Due: TDAP and second shingles Patient states she will call at a later time to schedule appointment  "

## 2024-02-10 NOTE — Patient Instructions (Signed)
 Jo [name],  Thank you for taking the time for your Medicare Wellness Visit. I appreciate your continued commitment to your health goals. Please review the care plan we discussed, and feel free to reach out if I can assist you further.  Please note that Annual Wellness Visits do not include a physical exam. Some assessments may be limited, especially if the visit was conducted virtually. If needed, we may recommend an in-person follow-up with your provider.  Ongoing Care Seeing your primary care provider every 3 to 6 months helps us  monitor your health and provide consistent, personalized care.   Referrals If a referral was made during today's visit and you haven't received any updates within two weeks, please contact the referred provider directly to check on the status.  Recommended Screenings:  Health Maintenance  Topic Date Due   DTaP/Tdap/Td vaccine (2 - Td or Tdap) 04/15/2019   Zoster (Shingles) Vaccine (2 of 2) 12/26/2022   Medicare Annual Wellness Visit  02/03/2024   COVID-19 Vaccine (4 - 2025-26 season) 02/15/2024*   Pneumococcal Vaccine for age over 71  Completed   Flu Shot  Completed   Osteoporosis screening with Bone Density Scan  Completed   Hepatitis C Screening  Completed   Meningitis B Vaccine  Aged Out   Breast Cancer Screening  Discontinued   Cologuard (Stool DNA test)  Discontinued  *Topic was postponed. The date shown is not the original due date.       02/10/2024    4:28 PM  Advanced Directives  Does Patient Have a Medical Advance Directive? No    Vision: Annual vision screenings are recommended for early detection of glaucoma, cataracts, and diabetic retinopathy. These exams can also reveal signs of chronic conditions such as diabetes and high blood pressure.  Dental: Annual dental screenings help detect early signs of oral cancer, gum disease, and other conditions linked to overall health, including heart disease and diabetes.  Please see the attached  documents for additional preventive care recommendations.

## 2024-02-18 ENCOUNTER — Ambulatory Visit
Admission: RE | Admit: 2024-02-18 | Discharge: 2024-02-18 | Disposition: A | Source: Ambulatory Visit | Attending: Family Medicine

## 2024-02-18 DIAGNOSIS — N644 Mastodynia: Secondary | ICD-10-CM

## 2024-03-01 ENCOUNTER — Encounter

## 2024-05-24 ENCOUNTER — Inpatient Hospital Stay

## 2024-06-04 ENCOUNTER — Inpatient Hospital Stay

## 2024-06-24 ENCOUNTER — Inpatient Hospital Stay
